# Patient Record
Sex: Female | Born: 1945 | Race: White | Hispanic: No | Marital: Married | Smoking: Never smoker
Health system: Southern US, Community
[De-identification: ages and names within clinical notes are randomized; demographics above are authoritative.]

## PROBLEM LIST (undated history)

## (undated) DIAGNOSIS — T7840XA Allergy, unspecified, initial encounter: Secondary | ICD-10-CM

## (undated) DIAGNOSIS — Z9889 Other specified postprocedural states: Secondary | ICD-10-CM

## (undated) DIAGNOSIS — C801 Malignant (primary) neoplasm, unspecified: Secondary | ICD-10-CM

## (undated) DIAGNOSIS — M719 Bursopathy, unspecified: Secondary | ICD-10-CM

## (undated) DIAGNOSIS — M1990 Unspecified osteoarthritis, unspecified site: Secondary | ICD-10-CM

## (undated) DIAGNOSIS — E785 Hyperlipidemia, unspecified: Secondary | ICD-10-CM

## (undated) DIAGNOSIS — I890 Lymphedema, not elsewhere classified: Secondary | ICD-10-CM

## (undated) DIAGNOSIS — R112 Nausea with vomiting, unspecified: Secondary | ICD-10-CM

## (undated) DIAGNOSIS — C4490 Unspecified malignant neoplasm of skin, unspecified: Secondary | ICD-10-CM

## (undated) DIAGNOSIS — R42 Dizziness and giddiness: Secondary | ICD-10-CM

## (undated) HISTORY — DX: Dizziness and giddiness: R42

## (undated) HISTORY — DX: Unspecified malignant neoplasm of skin, unspecified: C44.90

## (undated) HISTORY — PX: WISDOM TOOTH EXTRACTION: SHX21

## (undated) HISTORY — DX: Lymphedema, not elsewhere classified: I89.0

## (undated) HISTORY — PX: KNEE ARTHROSCOPY: SUR90

## (undated) HISTORY — DX: Allergy, unspecified, initial encounter: T78.40XA

## (undated) HISTORY — DX: Hyperlipidemia, unspecified: E78.5

## (undated) HISTORY — PX: CATARACT EXTRACTION: SUR2

## (undated) HISTORY — DX: Bursopathy, unspecified: M71.9

## (undated) HISTORY — PX: THYROID SURGERY: SHX805

---

## 1984-07-15 DIAGNOSIS — C801 Malignant (primary) neoplasm, unspecified: Secondary | ICD-10-CM

## 1984-07-15 HISTORY — DX: Malignant (primary) neoplasm, unspecified: C80.1

## 1984-07-15 HISTORY — PX: BREAST SURGERY: SHX581

## 1984-07-15 HISTORY — PX: BREAST RECONSTRUCTION: SHX9

## 1988-07-15 HISTORY — PX: ABDOMINAL HYSTERECTOMY: SHX81

## 1998-03-23 ENCOUNTER — Ambulatory Visit (HOSPITAL_BASED_OUTPATIENT_CLINIC_OR_DEPARTMENT_OTHER): Admission: RE | Admit: 1998-03-23 | Discharge: 1998-03-23 | Payer: Self-pay | Admitting: Podiatry

## 1998-07-15 DIAGNOSIS — I890 Lymphedema, not elsewhere classified: Secondary | ICD-10-CM

## 1998-07-15 HISTORY — DX: Lymphedema, not elsewhere classified: I89.0

## 2000-08-01 ENCOUNTER — Ambulatory Visit (HOSPITAL_COMMUNITY): Admission: RE | Admit: 2000-08-01 | Discharge: 2000-08-01 | Payer: Self-pay | Admitting: Gastroenterology

## 2000-12-16 ENCOUNTER — Other Ambulatory Visit: Admission: RE | Admit: 2000-12-16 | Discharge: 2000-12-16 | Payer: Self-pay | Admitting: Obstetrics and Gynecology

## 2001-05-25 ENCOUNTER — Ambulatory Visit (HOSPITAL_COMMUNITY): Admission: RE | Admit: 2001-05-25 | Discharge: 2001-05-25 | Payer: Self-pay | Admitting: Internal Medicine

## 2001-05-25 ENCOUNTER — Encounter: Payer: Self-pay | Admitting: Internal Medicine

## 2003-10-26 ENCOUNTER — Encounter: Admission: RE | Admit: 2003-10-26 | Discharge: 2004-01-24 | Payer: Self-pay | Admitting: *Deleted

## 2004-05-07 ENCOUNTER — Encounter: Payer: Self-pay | Admitting: Family Medicine

## 2004-07-15 HISTORY — PX: ANKLE FRACTURE SURGERY: SHX122

## 2004-11-09 ENCOUNTER — Encounter: Payer: Self-pay | Admitting: Internal Medicine

## 2004-11-09 ENCOUNTER — Ambulatory Visit: Payer: Self-pay | Admitting: Family Medicine

## 2005-02-27 ENCOUNTER — Ambulatory Visit: Payer: Self-pay | Admitting: Internal Medicine

## 2005-06-28 ENCOUNTER — Ambulatory Visit (HOSPITAL_COMMUNITY): Admission: RE | Admit: 2005-06-28 | Discharge: 2005-06-29 | Payer: Self-pay | Admitting: Orthopedic Surgery

## 2006-04-14 HISTORY — PX: COLONOSCOPY: SHX174

## 2006-11-10 ENCOUNTER — Ambulatory Visit: Payer: Self-pay | Admitting: Internal Medicine

## 2006-11-10 LAB — CONVERTED CEMR LAB
ALT: 21 units/L (ref 0–40)
Alkaline Phosphatase: 67 units/L (ref 39–117)
BUN: 15 mg/dL (ref 6–23)
Basophils Relative: 0.9 % (ref 0.0–1.0)
CO2: 29 meq/L (ref 19–32)
Calcium: 9 mg/dL (ref 8.4–10.5)
Cholesterol: 212 mg/dL (ref 0–200)
Creatinine, Ser: 0.9 mg/dL (ref 0.4–1.2)
GFR calc Af Amer: 81 mL/min
HDL: 52.1 mg/dL (ref >39.0)
Monocytes Relative: 9.6 % (ref 3.0–11.0)
Neutro Abs: 3.5 10*3/uL (ref 1.4–7.7)
Platelets: 336 10*3/uL (ref 150–400)
RBC: 4.79 M/uL (ref 3.87–5.11)
RDW: 12.4 % (ref 11.5–14.6)
T3, Free: 2.8 pg/mL (ref 2.3–4.2)
Total CHOL/HDL Ratio: 4.1
Total Protein: 7.4 g/dL (ref 6.0–8.3)
VLDL: 19 mg/dL (ref 0–40)

## 2006-11-18 ENCOUNTER — Encounter: Admission: RE | Admit: 2006-11-18 | Discharge: 2006-11-18 | Payer: Self-pay | Admitting: Internal Medicine

## 2006-11-18 ENCOUNTER — Encounter: Payer: Self-pay | Admitting: Internal Medicine

## 2006-11-20 ENCOUNTER — Encounter: Payer: Self-pay | Admitting: Internal Medicine

## 2006-12-02 ENCOUNTER — Encounter: Admission: RE | Admit: 2006-12-02 | Discharge: 2006-12-02 | Payer: Self-pay | Admitting: Internal Medicine

## 2006-12-02 ENCOUNTER — Encounter (INDEPENDENT_AMBULATORY_CARE_PROVIDER_SITE_OTHER): Payer: Self-pay | Admitting: Interventional Radiology

## 2006-12-02 ENCOUNTER — Other Ambulatory Visit: Admission: RE | Admit: 2006-12-02 | Discharge: 2006-12-02 | Payer: Self-pay | Admitting: Interventional Radiology

## 2006-12-11 ENCOUNTER — Encounter: Admission: RE | Admit: 2006-12-11 | Discharge: 2006-12-11 | Payer: Self-pay | Admitting: Internal Medicine

## 2006-12-17 ENCOUNTER — Encounter: Payer: Self-pay | Admitting: Internal Medicine

## 2007-03-20 ENCOUNTER — Encounter: Payer: Self-pay | Admitting: Internal Medicine

## 2007-12-02 ENCOUNTER — Ambulatory Visit: Payer: Self-pay | Admitting: Internal Medicine

## 2007-12-02 DIAGNOSIS — Z85828 Personal history of other malignant neoplasm of skin: Secondary | ICD-10-CM

## 2007-12-02 DIAGNOSIS — Z853 Personal history of malignant neoplasm of breast: Secondary | ICD-10-CM

## 2007-12-02 DIAGNOSIS — E785 Hyperlipidemia, unspecified: Secondary | ICD-10-CM | POA: Insufficient documentation

## 2007-12-02 DIAGNOSIS — E559 Vitamin D deficiency, unspecified: Secondary | ICD-10-CM | POA: Insufficient documentation

## 2007-12-02 DIAGNOSIS — J309 Allergic rhinitis, unspecified: Secondary | ICD-10-CM

## 2007-12-02 LAB — CONVERTED CEMR LAB
Albumin: 4 g/dL (ref 3.5–5.2)
BUN: 14 mg/dL (ref 6–23)
Basophils Relative: 1 % (ref 0.0–1.0)
Calcium: 9.2 mg/dL (ref 8.4–10.5)
Cholesterol, target level: 200 mg/dL
Creatinine, Ser: 0.9 mg/dL (ref 0.4–1.2)
Eosinophils Absolute: 0.2 10*3/uL (ref 0.0–0.7)
Eosinophils Relative: 2.6 % (ref 0.0–5.0)
Free T4: 0.8 ng/dL (ref 0.6–1.6)
GFR calc Af Amer: 81 mL/min
GFR calc non Af Amer: 67 mL/min
HCT: 42.7 % (ref 36.0–46.0)
Hemoglobin: 14.6 g/dL (ref 12.0–15.0)
Hgb A1c MFr Bld: 5.8 % (ref 4.6–6.0)
LDL Goal: 130 mg/dL
MCV: 90.1 fL (ref 78.0–100.0)
Monocytes Absolute: 0.5 10*3/uL (ref 0.1–1.0)
Neutro Abs: 3.9 10*3/uL (ref 1.4–7.7)
Platelets: 376 10*3/uL (ref 150–400)
RBC: 4.74 M/uL (ref 3.87–5.11)
TSH: 2.21 microintl units/mL (ref 0.35–5.50)
Total Protein: 7.3 g/dL (ref 6.0–8.3)
Triglycerides: 151 mg/dL — ABNORMAL HIGH (ref 0–149)
WBC: 6.3 10*3/uL (ref 4.5–10.5)

## 2007-12-03 ENCOUNTER — Encounter: Payer: Self-pay | Admitting: Internal Medicine

## 2007-12-04 ENCOUNTER — Encounter (INDEPENDENT_AMBULATORY_CARE_PROVIDER_SITE_OTHER): Payer: Self-pay | Admitting: *Deleted

## 2007-12-24 ENCOUNTER — Encounter: Admission: RE | Admit: 2007-12-24 | Discharge: 2007-12-24 | Payer: Self-pay | Admitting: Internal Medicine

## 2007-12-25 ENCOUNTER — Encounter (INDEPENDENT_AMBULATORY_CARE_PROVIDER_SITE_OTHER): Payer: Self-pay | Admitting: *Deleted

## 2008-03-03 ENCOUNTER — Ambulatory Visit: Payer: Self-pay | Admitting: Internal Medicine

## 2008-03-03 DIAGNOSIS — E042 Nontoxic multinodular goiter: Secondary | ICD-10-CM | POA: Insufficient documentation

## 2008-03-07 ENCOUNTER — Encounter (INDEPENDENT_AMBULATORY_CARE_PROVIDER_SITE_OTHER): Payer: Self-pay | Admitting: *Deleted

## 2008-03-07 LAB — CONVERTED CEMR LAB
ALT: 32 units/L (ref 0–35)
Albumin: 3.9 g/dL (ref 3.5–5.2)
HDL: 50.4 mg/dL (ref >39.0)
Total Bilirubin: 0.7 mg/dL (ref 0.3–1.2)
Total CHOL/HDL Ratio: 3.4
Triglycerides: 105 mg/dL (ref 0–149)
VLDL: 21 mg/dL (ref 0–40)
Vit D, 1,25-Dihydroxy: 49 (ref 30–89)

## 2008-03-11 ENCOUNTER — Telehealth (INDEPENDENT_AMBULATORY_CARE_PROVIDER_SITE_OTHER): Payer: Self-pay | Admitting: *Deleted

## 2009-03-13 ENCOUNTER — Encounter: Payer: Self-pay | Admitting: Internal Medicine

## 2009-04-06 ENCOUNTER — Ambulatory Visit: Payer: Self-pay | Admitting: Internal Medicine

## 2009-04-06 DIAGNOSIS — R7303 Prediabetes: Secondary | ICD-10-CM | POA: Insufficient documentation

## 2009-04-06 LAB — CONVERTED CEMR LAB
Glucose, Urine, Semiquant: NEGATIVE
Nitrite: NEGATIVE
Specific Gravity, Urine: <1.005
pH: 7

## 2009-04-07 ENCOUNTER — Encounter: Payer: Self-pay | Admitting: Internal Medicine

## 2009-04-09 LAB — CONVERTED CEMR LAB
ALT: 21 units/L (ref 0–35)
AST: 22 units/L (ref 0–37)
BUN: 15 mg/dL (ref 6–23)
Bilirubin, Direct: 0 mg/dL (ref 0.0–0.3)
Cholesterol: 203 mg/dL — ABNORMAL HIGH (ref 0–200)
Creatinine, Ser: 0.9 mg/dL (ref 0.4–1.2)
Eosinophils Relative: 2.9 % (ref 0.0–5.0)
GFR calc non Af Amer: 66.68 mL/min (ref >60)
HDL: 50.2 mg/dL (ref >39.00)
Hgb A1c MFr Bld: 5.8 % (ref 4.6–6.5)
Lymphocytes Relative: 24.4 % (ref 12.0–46.0)
Monocytes Relative: 9.6 % (ref 3.0–12.0)
Neutrophils Relative %: 62.4 % (ref 43.0–77.0)
Platelets: 310 10*3/uL (ref 150.0–400.0)
Total Bilirubin: 0.8 mg/dL (ref 0.3–1.2)
VLDL: 25.2 mg/dL (ref 0.0–40.0)
WBC: 7.2 10*3/uL (ref 4.5–10.5)

## 2009-04-10 ENCOUNTER — Telehealth (INDEPENDENT_AMBULATORY_CARE_PROVIDER_SITE_OTHER): Payer: Self-pay | Admitting: *Deleted

## 2009-04-11 ENCOUNTER — Telehealth (INDEPENDENT_AMBULATORY_CARE_PROVIDER_SITE_OTHER): Payer: Self-pay | Admitting: *Deleted

## 2009-04-11 LAB — CONVERTED CEMR LAB: Vit D, 25-Hydroxy: 21 ng/mL — ABNORMAL LOW (ref 30–89)

## 2009-04-12 ENCOUNTER — Encounter: Admission: RE | Admit: 2009-04-12 | Discharge: 2009-04-12 | Payer: Self-pay | Admitting: Internal Medicine

## 2009-04-14 ENCOUNTER — Encounter (INDEPENDENT_AMBULATORY_CARE_PROVIDER_SITE_OTHER): Payer: Self-pay | Admitting: *Deleted

## 2009-07-03 ENCOUNTER — Ambulatory Visit: Payer: Self-pay | Admitting: Internal Medicine

## 2009-07-10 ENCOUNTER — Ambulatory Visit: Payer: Self-pay | Admitting: Internal Medicine

## 2009-12-06 ENCOUNTER — Ambulatory Visit: Payer: Self-pay | Admitting: Family Medicine

## 2009-12-22 ENCOUNTER — Ambulatory Visit: Payer: Self-pay | Admitting: Internal Medicine

## 2009-12-22 ENCOUNTER — Telehealth (INDEPENDENT_AMBULATORY_CARE_PROVIDER_SITE_OTHER): Payer: Self-pay | Admitting: *Deleted

## 2009-12-22 LAB — CONVERTED CEMR LAB
Bilirubin Urine: NEGATIVE
Glucose, Urine, Semiquant: NEGATIVE
Protein, U semiquant: NEGATIVE
Specific Gravity, Urine: <1.005

## 2009-12-23 ENCOUNTER — Encounter: Payer: Self-pay | Admitting: Internal Medicine

## 2009-12-23 LAB — CONVERTED CEMR LAB
Casts: NONE SEEN /lpf
Crystals: NONE SEEN
RBC / HPF: NONE SEEN (ref <3)

## 2010-03-27 ENCOUNTER — Ambulatory Visit: Payer: Self-pay | Admitting: Family Medicine

## 2010-05-17 ENCOUNTER — Telehealth (INDEPENDENT_AMBULATORY_CARE_PROVIDER_SITE_OTHER): Payer: Self-pay | Admitting: *Deleted

## 2010-05-30 ENCOUNTER — Encounter: Payer: Self-pay | Admitting: Internal Medicine

## 2010-07-06 ENCOUNTER — Ambulatory Visit: Payer: Self-pay | Admitting: Internal Medicine

## 2010-07-06 ENCOUNTER — Encounter: Payer: Self-pay | Admitting: Internal Medicine

## 2010-07-06 DIAGNOSIS — M1990 Unspecified osteoarthritis, unspecified site: Secondary | ICD-10-CM | POA: Insufficient documentation

## 2010-07-10 ENCOUNTER — Encounter: Payer: Self-pay | Admitting: Internal Medicine

## 2010-07-10 ENCOUNTER — Telehealth: Payer: Self-pay | Admitting: Internal Medicine

## 2010-07-10 ENCOUNTER — Ambulatory Visit: Payer: Self-pay | Admitting: Internal Medicine

## 2010-07-10 LAB — CONVERTED CEMR LAB
Blood in Urine, dipstick: NEGATIVE
Nitrite: NEGATIVE
Urobilinogen, UA: 0.2
WBC Urine, dipstick: NEGATIVE

## 2010-07-11 ENCOUNTER — Ambulatory Visit: Admit: 2010-07-11 | Payer: Self-pay | Admitting: Internal Medicine

## 2010-07-11 LAB — CONVERTED CEMR LAB
ALT: 22 units/L (ref 0–35)
Albumin: 4.1 g/dL (ref 3.5–5.2)
BUN: 17 mg/dL (ref 6–23)
Basophils Relative: 0.7 % (ref 0.0–3.0)
CO2: 29 meq/L (ref 19–32)
Chloride: 103 meq/L (ref 96–112)
Cholesterol: 224 mg/dL — ABNORMAL HIGH (ref 0–200)
Creatinine, Ser: 0.9 mg/dL (ref 0.4–1.2)
Eosinophils Absolute: 0.2 10*3/uL (ref 0.0–0.7)
Eosinophils Relative: 2.7 % (ref 0.0–5.0)
Glucose, Bld: 89 mg/dL (ref 70–99)
Hemoglobin: 15.3 g/dL — ABNORMAL HIGH (ref 12.0–15.0)
MCHC: 33.6 g/dL (ref 30.0–36.0)
MCV: 92.4 fL (ref 78.0–100.0)
Monocytes Absolute: 0.6 10*3/uL (ref 0.1–1.0)
Neutro Abs: 4.8 10*3/uL (ref 1.4–7.7)
RBC: 4.94 M/uL (ref 3.87–5.11)
Total Protein: 7.3 g/dL (ref 6.0–8.3)
Triglycerides: 193 mg/dL — ABNORMAL HIGH (ref 0.0–149.0)
WBC: 7.9 10*3/uL (ref 4.5–10.5)

## 2010-07-12 ENCOUNTER — Ambulatory Visit
Admission: RE | Admit: 2010-07-12 | Discharge: 2010-07-12 | Payer: Self-pay | Source: Home / Self Care | Attending: Internal Medicine | Admitting: Internal Medicine

## 2010-07-12 LAB — CONVERTED CEMR LAB: Potassium: 4.5 meq/L (ref 3.5–5.1)

## 2010-07-17 ENCOUNTER — Telehealth: Payer: Self-pay | Admitting: Internal Medicine

## 2010-08-05 ENCOUNTER — Encounter: Payer: Self-pay | Admitting: Family Medicine

## 2010-08-14 NOTE — Progress Notes (Signed)
Summary: uti???  Phone Note Call from Patient Call back at Work Phone [phone]   Caller: Patient Summary of Call: patient has burning & frequency to urinate -wanted appt today Initial call taken by: Okey Regal Spring,  December 22, 2009 12:19 PM  Follow-up for Phone Call        OV SCHEDULED .Kandice Hams  December 22, 2009 1:22 PM

## 2010-08-14 NOTE — Assessment & Plan Note (Signed)
Summary: UTI/ALR   Vital Signs:  Patient profile:   65 year old female Weight:      244 pounds Temp:     98.3 degrees F oral BP sitting:   112 / 78  (left arm)  Vitals Entered By: Doristine Devoid (December 22, 2009 3:31 PM) CC: UTI sx started last night some frequency and dysuria    History of Present Illness: urinary frequency, started last night. This morning she did see some blood in her urine She just finished a 10 day course of Augmentin for a upper respiratory tract infection  Allergies: No Known Drug Allergies  Past History:  Past Medical History: Reviewed history from 04/06/2009 and no changes required. Thyroid nodule 1988(multinodular goiter ) Allergic rhinitis Hyperlipidemia : LDL goal + < 130 based on NMR Lipoprofile; Basal cell & squamous cell, Dr Nicholas Lose Vitamin D deficiency, Estill Bakes NP Breast cancer, hx of, bilateral  Past Surgical History: Reviewed history from 04/06/2009 and no changes required. Arthroscopy L knee 2003 ; ankle surgery post fracture 2006;G1 P 1 Colonoscopy 2007  neg , Dr Kinnie Scales Hysterectomy & bilat oophorectomy for fibroids Mastectomy bilat  with reconstruction 1985 & 1986 Mohs surgery facial CA X3  Social History: Reviewed history from 04/06/2009 and no changes required. No diet Occupation: Print production planner Married Never Smoked Alcohol use-yes: occasinally Regular exercise-yes: walking 1/4 mpd 3X/week  Review of Systems General:  Denies fever. GI:  Denies nausea and vomiting. GU:  no back or flank pain no vad d/c , itching, rash .  Physical Exam  General:  alert, well-developed, and overweight-appearing.   Lungs:  normal respiratory effort, no intercostal retractions, and no accessory muscle use.   Abdomen:  soft, non-tender, no distention, no masses, no guarding, and no rigidity.  no CVA tenderness Extremities:  no lower extremity edema   Impression & Recommendations:  Problem # 1:  UTI (ICD-599.0) symptoms and urine  consistent with a UTI Recommend to increase fluid intake, some antibiotics, call if not better in a few days Her updated medication list for this problem includes:    Ciprofloxacin Hcl 500 Mg Tabs (Ciprofloxacin hcl) .Marland Kitchen... 1 by mouth two times a day  Orders: UA Dipstick w/o Micro (manual) (56213) Specimen Handling (99000) T-Culture, Urine (08657-84696) T-Urine Microscopic (29528-41324)  Complete Medication List: 1)  Kls Aller-tec 10 Mg Tabs (Cetirizine hcl) .... Daily 2)  Oscal 500/200 D-3 500-200 Mg-unit Tabs (Calcium-vitamin d) .Marland Kitchen.. 1200 mg's daily 3)  Vitamin D 40102 Unit Caps (Ergocalciferol) .... Take one tablet weekly 4)  Pravachol 40 Mg Tabs (Pravastatin sodium) .Marland Kitchen.. 1 qhs 5)  Aspir-low 81 Mg Tbec (Aspirin) .... Daily 6)  Nasonex 50 Mcg/act Susp (Mometasone furoate) .... 2 sprays each nostril once daily 7)  Ciprofloxacin Hcl 500 Mg Tabs (Ciprofloxacin hcl) .Marland Kitchen.. 1 by mouth two times a day Prescriptions: CIPROFLOXACIN HCL 500 MG TABS (CIPROFLOXACIN HCL) 1 by mouth two times a day  #6 x 0   Entered and Authorized by:   Nolon Rod. Katina Remick MD   Signed by:   Nolon Rod. Raekwan Spelman MD on 12/22/2009   Method used:   Print then Give to Patient   RxID:   [id number]   Laboratory Results   Urine Tests    Routine Urinalysis   Glucose: negative   (Normal Range: Negative) Bilirubin: negative   (Normal Range: Negative) Ketone: negative   (Normal Range: Negative) Spec. Gravity: <1.005   (Normal Range: 1.003-1.035) Blood: large   (Normal Range: Negative) pH: 6.5   (  Normal Range: 5.0-8.0) Protein: negative   (Normal Range: Negative) Urobilinogen: 0.2   (Normal Range: 0-1) Nitrite: negative   (Normal Range: Negative) Leukocyte Esterace: large   (Normal Range: Negative)

## 2010-08-14 NOTE — Progress Notes (Signed)
Summary: lab order-pt will wait until after cpx  Phone Note Call from Patient   Caller: Patient Summary of Call: patient is scheduled for cpx [id number] - lab [phone] --- need lab order Initial call taken by: Okey Regal Spring,  May 17, 2010 3:57 PM  Follow-up for Phone Call        Lipid/Hep/CBCD/BMP/TSH/Vit D/Stool Cards  V70.0/286.9/272.4/995.20 Follow-up by: Shonna Chock CMA,  May 17, 2010 4:06 PM  Additional Follow-up for Phone Call Additional follow up Details #1::        patient has medicare -schedule lab appt [phone] after cpx appt .Marland KitchenOkey Regal Spring  May 17, 2010 4:30 PM

## 2010-08-14 NOTE — Assessment & Plan Note (Signed)
Summary: SORE THROAT/RH.....   Vital Signs:  Patient profile:   65 year old female Height:      64.5 inches Weight:      239 pounds BMI:     40.54 Pulse rate:   82 / minute Pulse rhythm:   regular BP sitting:   122 / 82  (left arm) Cuff size:   large  Vitals Entered By: Army Fossa CMA (Dec 06, 2009 11:10 AM) CC: Pt here for HA, Chest Congestion, drainage in her throat x 1  week. Has tried delysm., URI symptoms   History of Present Illness:       This is a 65 year old woman who presents with URI symptoms.  The symptoms began 1 week ago.  The patient complains of nasal congestion, purulent nasal discharge, sore throat, productive cough, earache, and sick contacts.  Associated symptoms include fever.  The patient also reports headache.  The patient denies itchy watery eyes, itchy throat, sneezing, seasonal symptoms, response to antihistamine, muscle aches, and severe fatigue.  The patient denies the following risk factors for Strep sinusitis: unilateral facial pain, unilateral nasal discharge, poor response to decongestant, double sickening, tooth pain, Strep exposure, tender adenopathy, and absence of cough.  + b/l sinus pressure / headache.  Pt tried delsym, tylenol with no relief.     Current Medications (verified): 1)  Kls Aller-Tec 10 Mg Tabs (Cetirizine Hcl) .... Daily 2)  Oscal 500/200 D-3 500-200 Mg-Unit  Tabs (Calcium-Vitamin D) .Marland Kitchen.. 1200 Mg's Daily 3)  Rhinocort Aqua 32 Mcg/act  Susp (Budesonide) .... 2 Sprays in Each Nostril Daily 4)  Vitamin D 45409 Unit  Caps (Ergocalciferol) .... Take One Tablet Weekly 5)  Pravachol 40 Mg  Tabs (Pravastatin Sodium) .Marland Kitchen.. 1 Qhs 6)  Nasacort Aq 55 Mcg/act Aers (Triamcinolone Acetonide(Nasal)) .... As Directed 7)  Aspir-Low 81 Mg Tbec (Aspirin) .... Daily 8)  Augmentin 875-125 Mg Tabs (Amoxicillin-Pot Clavulanate) .Marland Kitchen.. 1 By Mouth Two Times A Day 9)  Cheratussin Ac 100-10 Mg/89ml Syrp (Guaifenesin-Codeine) .Marland Kitchen.. 1-2 Tsp By Mouth At Bedtime As  Needed Cough  Allergies (verified): No Known Drug Allergies  Past History:  Past medical, surgical, family and social histories (including risk factors) reviewed for relevance to current acute and chronic problems.  Past Medical History: Reviewed history from 04/06/2009 and no changes required. Thyroid nodule 1988(multinodular goiter ) Allergic rhinitis Hyperlipidemia : LDL goal + < 130 based on NMR Lipoprofile; Basal cell & squamous cell, Dr Nicholas Lose Vitamin D deficiency, Estill Bakes NP Breast cancer, hx of, bilateral  Past Surgical History: Reviewed history from 04/06/2009 and no changes required. Arthroscopy L knee 2003 ; ankle surgery post fracture 2006;G1 P 1 Colonoscopy 2007  neg , Dr Kinnie Scales Hysterectomy & bilat oophorectomy for fibroids Mastectomy bilat  with reconstruction 1985 & 1986 Mohs surgery facial CA X3  Family History: Reviewed history from 04/06/2009 and no changes required. Father: neg (limited knowledge of health) Mother: HTN Siblings: neg MGF CVA  Social History: Reviewed history from 04/06/2009 and no changes required. No diet Occupation: Print production planner Married Never Smoked Alcohol use-yes: occasinally Regular exercise-yes: walking 1/4 mpd 3X/week  Review of Systems      See HPI  Physical Exam  General:  Well-developed,well-nourished,in no acute distress; alert,appropriate and cooperative throughout examination Ears:  External ear exam shows no significant lesions or deformities.  Otoscopic examination reveals clear canals, tympanic membranes are intact bilaterally without bulging, retraction, inflammation or discharge. Hearing is grossly normal bilaterally. Nose:  L frontal sinus tenderness,  L maxillary sinus tenderness, R frontal sinus tenderness, and R maxillary sinus tenderness.   Mouth:  Oral mucosa and oropharynx without lesions or exudates.  Teeth in good repair. Neck:  No deformities, masses, or tenderness noted. Lungs:  Normal respiratory  effort, chest expands symmetrically. Lungs are clear to auscultation, no crackles or wheezes. Heart:  normal rate and no murmur.   Psych:  Oriented X3 and normally interactive.     Impression & Recommendations:  Problem # 1:  SINUSITIS - ACUTE-NOS (ICD-461.9)  The following medications were removed from the medication list:    Rhinocort Aqua 32 Mcg/act Susp (Budesonide) .Marland Kitchen... 2 sprays in each nostril daily    Nasacort Aq 55 Mcg/act Aers (Triamcinolone acetonide(nasal)) .Marland Kitchen... As directed Her updated medication list for this problem includes:    Augmentin 875-125 Mg Tabs (Amoxicillin-pot clavulanate) .Marland Kitchen... 1 by mouth two times a day    Cheratussin Ac 100-10 Mg/24ml Syrp (Guaifenesin-codeine) .Marland Kitchen... 1-2 tsp by mouth at bedtime as needed cough    Nasonex 50 Mcg/act Susp (Mometasone furoate) .Marland Kitchen... 2 sprays each nostril once daily   Mucinex for daytime cough Instructed on treatment. Call if symptoms persist or worsen.   Orders: Prescription Created Electronically [phone])  Complete Medication List: 1)  Kls Aller-tec 10 Mg Tabs (Cetirizine hcl) .... Daily 2)  Oscal 500/200 D-3 500-200 Mg-unit Tabs (Calcium-vitamin d) .Marland Kitchen.. 1200 mg's daily 3)  Vitamin D 09811 Unit Caps (Ergocalciferol) .... Take one tablet weekly 4)  Pravachol 40 Mg Tabs (Pravastatin sodium) .Marland Kitchen.. 1 qhs 5)  Aspir-low 81 Mg Tbec (Aspirin) .... Daily 6)  Augmentin 875-125 Mg Tabs (Amoxicillin-pot clavulanate) .Marland Kitchen.. 1 by mouth two times a day 7)  Cheratussin Ac 100-10 Mg/64ml Syrp (Guaifenesin-codeine) .Marland Kitchen.. 1-2 tsp by mouth at bedtime as needed cough 8)  Nasonex 50 Mcg/act Susp (Mometasone furoate) .... 2 sprays each nostril once daily Prescriptions: CHERATUSSIN AC 100-10 MG/5ML SYRP (GUAIFENESIN-CODEINE) 1-2 tsp by mouth at bedtime as needed cough  #6 oz x 0   Entered and Authorized by:   Loreen Freud DO   Signed by:   Loreen Freud DO on 12/06/2009   Method used:   Print then Give to Patient   RxID:   [id number] AUGMENTIN  875-125 MG TABS (AMOXICILLIN-POT CLAVULANATE) 1 by mouth two times a day  #20 x 0   Entered and Authorized by:   Loreen Freud DO   Signed by:   Loreen Freud DO on 12/06/2009   Method used:   Electronically to        [facility] [phone]* (retail)       [address]       [place], [place]  [address]       Ph: [phone] or [phone]       Fax: [phone]   RxID:   [id number]

## 2010-08-16 NOTE — Assessment & Plan Note (Signed)
Summary: CPX/CBS   Vital Signs:  Patient profile:   65 year old female Height:      64.25 inches Weight:      247 pounds BMI:     42.22 Temp:     97.6 degrees F oral Pulse rate:   76 / minute Resp:     16 per minute BP sitting:   128 / 80  (left arm) Cuff size:   large  Vitals Entered By: Shonna Chock CMA (July 06, 2010 1:15 PM) CC: CPX , Lipid Management   Primary Care Provider:  Marga Melnick MD  CC:  CPX  and Lipid Management.  History of Present Illness: Here for Medicare AWV: 1.Risk factors based on Past M, S, F history:see Diagnoses ( chart updated) 2.Physical Activities: stationary bike 15 min occasionally; walking 1 mpd every other day  3.Depression/mood: no issues 4.Hearing: whisper heard @ 6 ft 5.ADL's: no limitations 6.Fall Risk: none  7.Home Safety: no risk 8.Height, weight, &visual acuity:wall chart read @ 6 ft with lenses 9.Counseling: POA & Living Will discussed. Caffeine consumption (4 cups / day ) discussed 10.Labs ordered based on risk factors: see Orders 11. Referral Coordination: none requested;  preventive services up to date Shirlyn Goltz , NP seen 04/2010 12. Care Plan: see Instructions( see # 9 above) 13. Cognitive Assessment: Oriented X 3;   memory & recall   ;"WORLD" spelled backwards ; mood & affect normal. Hyperlipidemia Follow-Up      This is a 65 year old woman who also  presents for Hyperlipidemia follow-up.  The patient denies muscle aches, GI upset, abdominal pain, flushing, itching, constipation, diarrhea, and fatigue.  The patient denies the following symptoms: chest pain/pressure, exercise intolerance , dypsnea, palpitations, syncope, and pedal edema.  Compliance with medications (by patient report) has been near 100%.  Dietary compliance has been fair.  The patient reports exercising 3-4X per week.  Adjunctive measures currently used by the patient include fiber, ASA, and fish oil supplements.    Lipid Management History:  Positive NCEP/ATP III risk factors include female age 67 years old or older and early menopause without estrogen hormone replacement.  Negative NCEP/ATP III risk factors include non-diabetic, no family history for ischemic heart disease, non-tobacco-user status, non-hypertensive, no ASHD (atherosclerotic heart disease), no prior stroke/TIA, no peripheral vascular disease, and no history of aortic aneurysm.     Preventive Screening-Counseling & Management  Alcohol-Tobacco     Alcohol drinks/day: 0     Smoking Status: never  Caffeine-Diet-Exercise     Caffeine use/day: 4 cups  Hep-HIV-STD-Contraception     Dental Visit-last 6 months yes     Sun Exposure-Excessive: no  Safety-Violence-Falls     Seat Belt Use: yes     Smoke Detectors: yes      Blood Transfusions:  no.        Travel History:  [place]  1991.    Current Medications (verified): 1)  Kls Aller-Tec 10 Mg Tabs (Cetirizine Hcl) .... Daily 2)  Oscal 500/200 D-3 500-200 Mg-Unit  Tabs (Calcium-Vitamin D) .Marland Kitchen.. 1200 Mg's Daily 3)  Vitamin D 04540 Unit  Caps (Ergocalciferol) .Marland Kitchen.. 1 By Mouth Every Other Week 4)  Pravachol 40 Mg  Tabs (Pravastatin Sodium) .Marland Kitchen.. 1 By Mouth At Bedtime **appointment Due** 5)  Aspir-Low 81 Mg Tbec (Aspirin) .... Daily 6)  Nasonex 50 Mcg/act Susp (Mometasone Furoate) .... 2 Sprays Each Nostril Once Daily  Allergies (verified): No Known Drug Allergies  Past History:  Past Medical History:  Thyroid nodule , PMH of 1988 (multinodular goiter ) Allergic rhinitis Hyperlipidemia : 2005 NMR Lipoprofile : LDL 147( 1967/ 1523), HDL 60, TG 76. LDL goal = < 100. [place] Study LDL goal = < 130 ; Basal cell & squamous cell, Dr Nicholas Lose Vitamin D deficiency,Patty Berneice Gandy NP Breast cancer, PMH  of, bilateral 1988  Past Surgical History: Arthroscopy L knee 2003 , R knee 2009; ankle surgery post fracture 2006;G1 P 1 Colonoscopy 2007  negative  , Dr Kinnie Scales  Mastectomy bilat  with reconstruction 1988   Mohs surgery  facial  cancer  X3, Dr Nicholas Lose Hysterectomy & bilateral Oophorectomy for fibroids 1990   Family History: Father: negative  (limited knowledge of  his health history ) Mother: HTN Siblings: negative MGF: CVA  Social History: No diet Occupation: Print production planner Married Never Smoked Caffeine use/day:  4 cups Dental Care w/in 6 mos.:  yes Sun Exposure-Excessive:  no Risk analyst Use:  yes Blood Transfusions:  no  Review of Systems  The patient denies anorexia, fever, weight loss, weight gain, vision loss, decreased hearing, hoarseness, prolonged cough, hemoptysis, melena, hematochezia, severe indigestion/heartburn, hematuria, unusual weight change, abnormal bleeding, enlarged lymph nodes, and angioedema.    Physical Exam  General:  well-nourished; alert,appropriate and cooperative throughout examination Head:  Normocephalic and atraumatic without obvious abnormalities. Eyes:  No corneal or conjunctival inflammation noted. Perrla. Funduscopic exam benign, without hemorrhages, exudates or papilledema. Ears:  External ear exam shows no significant lesions or deformities.  Otoscopic examination reveals clear canals, tympanic membranes are intact bilaterally without bulging, retraction, inflammation or discharge. Hearing is grossly normal bilaterally. Nose:  External nasal examination shows no deformity or inflammation. Nasal mucosa are pink and moist without lesions or exudates. Minimal dislocation Mouth:  Oral mucosa and oropharynx without lesions or exudates.  Teeth in good repair. Neck:  No deformities, masses, or tenderness noted.  Lungs:  Normal respiratory effort, chest expands symmetrically. Lungs are clear to auscultation, no crackles or wheezes. Heart:  Normal rate and regular rhythm. S1 and S2 normal without gallop, murmur, click, rub .S4 Abdomen:  Bowel sounds positive,abdomen soft and non-tender without masses, organomegaly or hernias noted. Genitalia:  Julieta Gutting, NP Msk:  No  deformity or scoliosis noted of thoracic or lumbar spine.   Pulses:  R and L carotid,radial,dorsalis pedis and posterior tibial pulses are full and equal bilaterally Extremities:  No clubbing, cyanosis, edema. Aarus changes  with marked crepitus , R >> L. Neurologic:  alert & oriented X3 and DTRs symmetrical and normal.   Skin:  Intact without suspicious lesions or rashes Cervical Nodes:  No lymphadenopathy noted Axillary Nodes:  No palpable lymphadenopathy Psych:  memory intact for recent and remote, normally interactive, and good eye contact.     Impression & Recommendations:  Problem # 1:  PREVENTIVE HEALTH CARE (ICD-V70.0)  Orders: Medicare -1st Annual Wellness Visit [phone])  Problem # 2:  HYPERLIPIDEMIA (ICD-272.4)  Her updated medication list for this problem includes:    Pravachol 40 Mg Tabs (Pravastatin sodium) .Marland Kitchen... 1 by mouth at bedtime  Orders: EKG w/ Interpretation (93000)  Problem # 3:  UNSPECIFIED VITAMIN D DEFICIENCY (ICD-268.9)  Problem # 4:  FASTING HYPERGLYCEMIA (ICD-790.29)  Problem # 5:  NONTOXIC MULTINODULAR GOITER (ICD-241.1)  Problem # 6:  ALLERGIC RHINITIS (ICD-477.9) controlled with OTC Zyrtec Her updated medication list for this problem includes:    Kls Aller-tec 10 Mg Tabs (Cetirizine hcl) .Marland Kitchen... Daily    Nasonex 50 Mcg/act Susp (Mometasone furoate) .Marland KitchenMarland KitchenMarland KitchenMarland Kitchen 2  sprays each nostril once daily  Complete Medication List: 1)  Kls Aller-tec 10 Mg Tabs (Cetirizine hcl) .... Daily 2)  Oscal 500/200 D-3 500-200 Mg-unit Tabs (Calcium-vitamin d) .Marland Kitchen.. 1200 mg's daily 3)  Vitamin D 16109 Unit Caps (Ergocalciferol) .Marland Kitchen.. 1 by mouth every other week 4)  Pravachol 40 Mg Tabs (Pravastatin sodium) .Marland Kitchen.. 1 by mouth at bedtime 5)  Aspir-low 81 Mg Tbec (Aspirin) .... Daily 6)  Nasonex 50 Mcg/act Susp (Mometasone furoate) .... 2 sprays each nostril once daily  Lipid Assessment/Plan:      Based on NCEP/ATP III, the patient's risk factor category is "0-1 risk factors".  The  patient's lipid goals are as follows: Total cholesterol goal is 200; LDL cholesterol goal is 130; HDL cholesterol goal is 50; Triglyceride goal is 150.  Her LDL cholesterol goal has not been met.  Secondary causes for hyperlipidemia have been ruled out.  She has been counseled on adjunctive measures for lowering her cholesterol and has been provided with dietary instructions.    Patient Instructions: 1)  Schedule fasting labs; see  Diagnoses for Codes:vitamin D level; 2)  BMP; 3)  Hepatic Panel ; 4)  Lipid Panel ; 5)  TSH ; 6)  HbgA1C .  7)  It is important that you exercise regularly at least 20 minutes 5 times a week. If you develop chest pain, have severe difficulty breathing, or feel very tired , stop exercising immediately and seek medical attention. Consider reducing caffeine intake. 8)  Take an  81 mg coated Aspirin every day. Prescriptions: PRAVACHOL 40 MG  TABS (PRAVASTATIN SODIUM) 1 by mouth at bedtime  #90 x 3   Entered and Authorized by:   Marga Melnick MD   Signed by:   Marga Melnick MD on 07/06/2010   Method used:   Print then Give to Patient   RxID:   [id number]    Orders Added: 1)  Medicare -1st Annual Wellness Visit [G0438] 2)  Est. Patient Level III [95621] 3)  EKG w/ Interpretation [93000]

## 2010-08-16 NOTE — Progress Notes (Signed)
Summary: RX clarification  Phone Note Call from Patient Call back at Work Phone [phone]   Summary of Call: Patient would like to know if she should continue the Pravachol 40mg  given to her or does she need a different dosage? She would also like clarification of vitamin D. She currently takes 50,000 units q 2 weeks, and would like to be sure that she is to take 1000 units per day in addition to the 50,000 she is already taking. Please advise. Initial call taken by: Lucious Groves CMA,  July 17, 2010 2:42 PM  Follow-up for Phone Call        add vitamin D3 1000 International Units once daily to present dose. Level was 34 ; goal = 40-60. Recheck in 4 months. Stay on Pravastatin but check [place] Panel also in 4 months after nutrition changes (see Appendum to labs) Follow-up by: Marga Melnick MD,  July 17, 2010 3:36 PM  Additional Follow-up for Phone Call Additional follow up Details #1::        Called patient to notify and she has already left for today. Lucious Groves CMA  July 17, 2010 4:15 PM  Patient spouse advised to have the patient call me. Lucious Groves CMA  July 17, 2010 4:37 PM     Additional Follow-up for Phone Call Additional follow up Details #2::    Patient notified. Lucious Groves CMA  July 18, 2010 8:32 AM

## 2010-08-16 NOTE — Progress Notes (Signed)
Summary: critical lab  Phone Note From Other Clinic   Caller: [facility] Summary of Call: [facility] from [facility] called w/ Critical lab report. Pts K+ is 6.4. Sample is fine, not hemolized.  Initial call taken by: Army Fossa CMA,  July 10, 2010 2:10 PM  Follow-up for Phone Call        Spoke with patient on work phone, per Dr.Hopper: 1.) Recheck potassium in the am 2.) Reviewed med list, no listed meds that would cause this  3.) Stop Citrus or any OTC potassium Follow-up by: Shonna Chock CMA,  July 10, 2010 2:33 PM

## 2010-11-14 ENCOUNTER — Other Ambulatory Visit (INDEPENDENT_AMBULATORY_CARE_PROVIDER_SITE_OTHER): Payer: Medicare Other

## 2010-11-14 DIAGNOSIS — E785 Hyperlipidemia, unspecified: Secondary | ICD-10-CM

## 2010-11-14 DIAGNOSIS — E559 Vitamin D deficiency, unspecified: Secondary | ICD-10-CM

## 2010-11-15 ENCOUNTER — Other Ambulatory Visit: Payer: Self-pay

## 2010-11-30 NOTE — Op Note (Signed)
NAMEBABS, DABBS                ACCOUNT NO.:  [id number]   MEDICAL RECORD NO.:  [id number]          PATIENT TYPE:  OIB   LOCATION:  5007                         FACILITY:  [facility]   PHYSICIAN:  Doralee Albino. Carola Frost, M.D. DATE OF BIRTH:  [birth year]   DATE OF PROCEDURE:  06/30/2005  DATE OF DISCHARGE:  06/29/2005                                 OPERATIVE REPORT   PREOPERATIVE DIAGNOSIS:  Left trimalleolar fracture.   POSTOPERATIVE DIAGNOSIS:  Left trimalleolar fracture.   PROCEDURE:  ORIF of trimalleolar ankle fracture without fixation of the  posterior lip.   SURGEON:  Doralee Albino. Carola Frost, M.D.   ASSISTANT:  Cecil Cranker, PA   ANESTHESIA:  General.   COMPLICATIONS:  None.   TOURNIQUET:  None.   ESTIMATED BLOOD LOSS:  Less than 80 cc.   DISPOSITION:  To PACU.   CONDITION:  Stable.   BRIEF SUMMARY AND INDICATIONS FOR PROCEDURE:  Tanya [name] is a 65 year old  female who initially presented to our clinic with a trimalleolar fracture  dislocation. She underwent a closed reduction using an ankle block, and has  been mobilized in a splint until resolution of her soft tissue swelling has  allowed for definitive treatment of the bimalleolar equivalent. The  posterior malleolus constituted less than 15% of  the articular surface; it  did not warrant additional internal fixation. After discussion of the risks  and benefits of surgery, including the possibility of infection, nerve  injury, vessel injury, need for further surgery, malunion, delayed union,  decreased range of motion and arthritis, the patient wished to proceed. She  also understood perioperative complications such as heart attack, stroke,  and embolism.   DESCRIPTION OF PROCEDURE:  Ms. Ditullio was administered preoperative  antibiotics; taken to operating room, where general anesthesia was induced.  Her left lower extremity was prepped and draped in the usual sterile  fashion.  A tourniquet was used. A standard  incision over the fibula was  made approximately 5 cm.  Dissection was carried carefully through the  superficial tissues to watch for the superficial peroneal nerve (which was  not directly encountered). Dissection continued deep onto the soft tissues  overlying the fibula. Periosteal layer was protected, except that the  fracture site where a 15 blade was used to scratch the periosteum just along  the edges of the fracture. This enabled [place] to perform an anatomic reduction,  after evacuating the fracture site of hematoma with curets and lavage.  It  was held with a tenaculum while 2 anterior-to- posterior lag screws were  placed using 3.5 drill to overdrill the near cortex, countersink device and  the 2.5 drill for the 4-cortex. A neutralization plate was then placed on  the lateral aspect of the fibula to assist in maintenance of this reduction.  Attention was then turned to the medial side, where a hockey-shaped incision  was made directly over the medial malleolus. Dissection was carried  carefully down to avoid injury to the saphenous nerve and vein, which was  retracted anteriorly. The fracture site was visualized, and again, a 15  blade  used to tease back the edges of the periosteum. We also looked into  the joint; did not see any loose cartilaginous debris, nor any full  thickness injury to the tibial surface nor the talar dome. The fracture  reduced very easily posteriorly and held quite well anteriorly.  The  fracture did extend just barely out onto the surface of the distal tibia.  Two partially threaded 35 mm cancellous screws were then placed  perpendicular to this fracture plane, which was performed under direct  visualization. This resulted in the screws being somewhat more anterior than  usual, but with excellent effect in terms of compression across the fracture  site. After placing these 2 screws, the wounds were copiously irrigated and  closed in standard layered fashion.  Prior to beginning the closure, an  external rotation stress view of the ankle was performed under live  fluoroscopy to evaluate for syndesmotic instability -- (there was none).  Sterile gently compressive dressing and posterior stirrup splint were  applied. The patient was awakened from anesthesia and transported to the  PACU in stable condition.   PROGNOSIS:  Ms. Koerner will be admitted overnight for observation, with plans  to discharge in the morning. She has been nonweightbearing at home, and has  done well with this.  We anticipate this will continue. When she returns in  2 weeks I well remove her staples and likely apply another short leg cast,  and then started a cam boot at 4 weeks, allowing for active and passive  flexion/extension of the ankle. She will take 10 days of Lovenox and then  transition to baby aspirin for DVT prophylaxis.      Doralee Albino. Carola Frost, M.D.  Electronically Signed     [facility]/MEDQ  D:  06/30/2005  T:  07/02/2005  Job:  [id number]

## 2010-11-30 NOTE — Assessment & Plan Note (Signed)
[facility]                        [place] OFFICE NOTE   Tanya [name], Tanya [name]                       MRN:          [id number]  DATE:11/10/2006                            DOB:          [birth year]    CHIEF CONCERN:  Dr. Blima Ledger, optometrist questions hypertensive  changes on fundal exam.  She denies epistaxis, significant headache or  cardiopulmonary symptoms.  Her mother did have hypertension and maternal  grandfather had a stroke.   Her past medical history is complicated but unchanged.  In 1998 she had  a mastectomy with reconstruction for breast cancer & had complete  hysterectomy and bilateral salpingo-oophorectomy for fibroids in 1990.  Arthroscopy was performed on the knee in 2003.  She had a fracture of  the ankle in 2006.  She is a gravida 1, para 1.  Colonoscopy in December  2007 was negative.  There is a history of thyroid nodules but is on no  thyroid medication.  She also has a history of dyslipidemia, which has  been treated with diet and exercise.   FAMILY HISTORY:  Negative for cancer, diabetes or heart attack.   She has never smoked; she drinks occasionally.  She may have an  intolerance to ORAL STEROIDS.   She is on Zyrtec 10 mg at bedtime if needed, Calcium 600 mg daily,  multivitamins, baby aspirin.   She has been walking a mile per day with no cardiopulmonary symptoms.  She is on no specific diet;however, she is considering nutritional  consultation because of approximately a 40 pound weight gain since she  had the fractured ankle.   REVIEW OF SYSTEMS:  Reveals no temperature intolerance or skin or hair  change of significance.  She has had chronic constipation.   She has intermittent paresthesias of the right foot.   She states that she felt she had some clinical depression following the  ankle fracture as she was sedentary and profoundly limited as to  physical  activity.   The remainder of the review of  systems is negative.   She is 5 feet 5, weight 245 which is a 38 pound weight gain since August  2006.  Pulse was 78, respiratory rate 15 and blood pressure 128/86.  On fundal exam she does have some arteriolar narrowing.  Dental hygiene  is excellent.  The otolaryngologic exam is unremarkable.  There is suggestion of nodule in the midline or the right lobe of the  thyroid noted with the swallowing maneuver.  She has no lymphadenopathy in the neck or axilla.  There is an S4 without obvious murmurs or gallops.  All pulses are intact.  There is no significant edema.  CHEST:  Clear.  There is organomegaly or masses or abdominal tenderness.  She does have crepitus of the knees.  Deep tendon reflexes are normal.  BREAST, PELVIC, RECTAL EXAM:  Are deferred to her Gynecologist.  NEUROPSYCHIATRIC EXAM:  Unremarkable.   Fasting labs will be collected.  Because of the weight gain and the  possible thyroid nodules complete thyroid function tests were drawn.   Additionally because of the weight  gain and question of hypertension,  hemoglobin A1c will be drawn to assess diabetic risk.  Based on review  of the chart her LDL goal should be less than 125.   A goal sheet was provided.  Her nutrition consultation can be completed  once the lipids and A1c are reviewed.  I will ask her to visit the  Prevention.com web site [employer] Diet which is heart healthy and low  carbs.  I will also recommend that she avoid foods with high fructose  corn syrup as the 1st, 2nd or 3rd ingredient as a prelude to the  nutritional consult.     Titus Dubin. Alwyn Ren, MD,FACP,FCCP  Electronically Signed    [facility]/MedQ  DD: 11/10/2006  DT: 11/10/2006  Job #: [id number]   cc:   Daleen Bo, Dr. Hyacinth Meeker

## 2010-12-13 ENCOUNTER — Encounter: Payer: Self-pay | Admitting: Internal Medicine

## 2011-01-29 ENCOUNTER — Ambulatory Visit (INDEPENDENT_AMBULATORY_CARE_PROVIDER_SITE_OTHER): Payer: Medicare Other | Admitting: Internal Medicine

## 2011-01-29 ENCOUNTER — Encounter: Payer: Self-pay | Admitting: Internal Medicine

## 2011-01-29 DIAGNOSIS — E559 Vitamin D deficiency, unspecified: Secondary | ICD-10-CM

## 2011-01-29 DIAGNOSIS — R7309 Other abnormal glucose: Secondary | ICD-10-CM

## 2011-01-29 DIAGNOSIS — E785 Hyperlipidemia, unspecified: Secondary | ICD-10-CM

## 2011-01-29 MED ORDER — SIMVASTATIN 20 MG PO TABS
20.0000 mg | ORAL_TABLET | Freq: Every evening | ORAL | Status: DC
Start: 2011-01-29 — End: 2011-04-30

## 2011-01-29 MED ORDER — NIACIN ER (ANTIHYPERLIPIDEMIC) 500 MG PO TBCR: 500.0000 mg | EXTENDED_RELEASE_TABLET | Freq: Every day | ORAL | Status: DC

## 2011-01-29 NOTE — Progress Notes (Signed)
  Subjective:    Patient ID: Tanya [name], female    DOB: 1946-05-27, 65 y.o.   MRN: [id number]  HPI Dyslipidemia assessment: Prior Advanced Lipid Testing: NMR LDL goal = < 100..   Family history of premature CAD/ MI: MGM MI @ 57 .  Nutrition: decreased carbs .  Exercise: biking @ gym 3X/ week; walking 1/2 mpd  2X/ week . Diabetes : A1c 5.6% on BHP . HTN: no. Smoking history  : never .   Weight :  stable. Lab results reviewed :[facility] panel risks: LDL 136, Lp(A) 118, TG 149,   Vitamin D level was 39, lower limits of normal. She's been on 50,000 international units once in 2 weeks. .     Review of Systems  ROS: fatigue: no ; chest pain : no ;claudication: no; palpitations: no; abd pain/bowel changes: no ; myalgias:no;  syncope : no ; memory loss: no;skin changes: no.      Objective:   Physical Exam Gen.: Healthy and well-nourished in appearance. Alert, appropriate and cooperative throughout exam. Neck: No deformities, masses, or tenderness noted.  Thyroid normal. Lungs: Normal respiratory effort; chest expands symmetrically. Lungs are clear to auscultation without rales, wheezes, or increased work of breathing. Heart: Normal rate and rhythm. Normal S1 and S2. No gallop, click, or rub. No murmur. Abdomen: Bowel sounds normal; abdomen soft and nontender. No masses, organomegaly or hernias noted.No AAA or bruits                                                                                   Musculoskeletal/extremities:  No clubbing, cyanosis, edema, or deformity noted. Varus deformity of knes. Nail health  good. Vascular: Carotid, radial artery, dorsalis pedis and  posterior tibial pulses are full and equal. No bruits present. Neurologic: Alert and oriented x3.  Skin: Intact without suspicious lesions or rashes.  Psych: Mood and affect are normal. Normally interactive                                                                                         Assessment & Plan:  #1  dyslipidemia; no family history of premature coronary disease. Elevated Lp(a). and high normal triglycerides. Risk and options discussed. Niaspan trial. Change to Simvastatin  #2 vitamin D deficiency. Because of the controversy with vitamin D; it will be recommended that she take 2000 international units of vitamin D 3 daily and recheck the vitamin D level in 6 months.

## 2011-01-29 NOTE — Patient Instructions (Signed)
Preventive Health Care: Exercise  30-45  minutes a day, 3-4 days a week. Walking is especially valuable in preventing Osteoporosis. Eat a low-fat diet with lots of fruits and vegetables, up to 7-9 servings per day. Avoid obesity; your goal = waist less than 35 inches.Consume less than 30 grams of sugar per day from foods & drinks with High Fructose Corn Syrup as #2,3 or #4 on label. Follow the low carb nutrition program in [employer] as closely as possible to prevent Diabetes . Please  schedule fasting Labs : Lipids, hepatic panel in 10 weeks

## 2011-04-30 ENCOUNTER — Other Ambulatory Visit: Payer: Self-pay | Admitting: *Deleted

## 2011-04-30 DIAGNOSIS — E785 Hyperlipidemia, unspecified: Secondary | ICD-10-CM

## 2011-04-30 MED ORDER — SIMVASTATIN 20 MG PO TABS: 20.0000 mg | ORAL_TABLET | Freq: Every evening | ORAL | Status: DC

## 2011-04-30 MED ORDER — NIACIN ER (ANTIHYPERLIPIDEMIC) 500 MG PO TBCR: 500.0000 mg | EXTENDED_RELEASE_TABLET | Freq: Every day | ORAL | Status: DC

## 2011-07-22 ENCOUNTER — Other Ambulatory Visit: Payer: Self-pay | Admitting: Internal Medicine

## 2011-08-19 ENCOUNTER — Ambulatory Visit (INDEPENDENT_AMBULATORY_CARE_PROVIDER_SITE_OTHER): Payer: Medicare Other | Admitting: Internal Medicine

## 2011-08-19 ENCOUNTER — Encounter: Payer: Self-pay | Admitting: Internal Medicine

## 2011-08-19 VITALS — BP 116/70 | HR 78 | Temp 97.6°F | Resp 12 | Ht 64.0 in | Wt 255.4 lb

## 2011-08-19 DIAGNOSIS — M1990 Unspecified osteoarthritis, unspecified site: Secondary | ICD-10-CM

## 2011-08-19 DIAGNOSIS — Z0000 Encounter for general adult medical examination without abnormal findings: Secondary | ICD-10-CM

## 2011-08-19 DIAGNOSIS — E042 Nontoxic multinodular goiter: Secondary | ICD-10-CM

## 2011-08-19 DIAGNOSIS — E559 Vitamin D deficiency, unspecified: Secondary | ICD-10-CM

## 2011-08-19 DIAGNOSIS — Z853 Personal history of malignant neoplasm of breast: Secondary | ICD-10-CM

## 2011-08-19 DIAGNOSIS — R7309 Other abnormal glucose: Secondary | ICD-10-CM

## 2011-08-19 DIAGNOSIS — E785 Hyperlipidemia, unspecified: Secondary | ICD-10-CM

## 2011-08-19 LAB — HEPATIC FUNCTION PANEL
ALT: 19 U/L (ref 0–35)
Albumin: 4.1 g/dL (ref 3.5–5.2)
Alkaline Phosphatase: 75 U/L (ref 39–117)
Bilirubin, Direct: 0.1 mg/dL (ref 0.0–0.3)
Total Protein: 7.3 g/dL (ref 6.0–8.3)

## 2011-08-19 LAB — CBC WITH DIFFERENTIAL/PLATELET
Basophils Absolute: 0.1 10*3/uL (ref 0.0–0.1)
Basophils Relative: 1.1 % (ref 0.0–3.0)
Eosinophils Absolute: 0.3 10*3/uL (ref 0.0–0.7)
Hemoglobin: 14.5 g/dL (ref 12.0–15.0)
Lymphocytes Relative: 29.4 % (ref 12.0–46.0)
MCHC: 33.3 g/dL (ref 30.0–36.0)
Monocytes Relative: 9.5 % (ref 3.0–12.0)
Neutro Abs: 4.4 10*3/uL (ref 1.4–7.7)
Neutrophils Relative %: 55.7 % (ref 43.0–77.0)
RBC: 4.69 Mil/uL (ref 3.87–5.11)
RDW: 12.9 % (ref 11.5–14.6)

## 2011-08-19 LAB — BASIC METABOLIC PANEL
Calcium: 9 mg/dL (ref 8.4–10.5)
Creatinine, Ser: 0.8 mg/dL (ref 0.4–1.2)
GFR: 79.25 mL/min (ref >60.00)
Sodium: 138 mEq/L (ref 135–145)

## 2011-08-19 LAB — LIPID PANEL
Cholesterol: 182 mg/dL (ref 0–200)
LDL Cholesterol: 99 mg/dL (ref 0–99)
Triglycerides: 123 mg/dL (ref 0.0–149.0)

## 2011-08-19 LAB — HEMOGLOBIN A1C: Hgb A1c MFr Bld: 5.8 % (ref 4.6–6.5)

## 2011-08-19 MED ORDER — NIACIN ER (ANTIHYPERLIPIDEMIC) 500 MG PO TBCR: 500.0000 mg | EXTENDED_RELEASE_TABLET | Freq: Every day | ORAL | Status: DC

## 2011-08-19 NOTE — Progress Notes (Signed)
Subjective:    Patient ID: Tanya [name], female    DOB: 1946-03-04, 66 y.o.   MRN: [id number]  HPI Medicare Wellness Visit:  The following psychosocial & medical history were reviewed as required by Medicare.   Social history: caffeine: 1 cup/ day , alcohol:  rare ,  tobacco use : never & exercise : not able due to DJD of knee(surgery to be scheduled  By Dr Despina Hick).   Home & personal  safety / fall risk:she exerts care with walking, activities of daily living: no limitations , seatbelt use : yes , and smoke alarm employment : yes .  Power of Attorney/Living Will status :in place  Vision ( as recorded per Nurse) & Hearing  evaluation : see exam. Orientation :orientedX 3 , memory & recall :good, spelling  testing: good ,and mood & affect : normal . Depression / anxiety: denied Travel history : [place] , immunization status :up to date , transfusion history:  no, and preventive health surveillance ( colonoscopies, BMD , etc as per protocol/ [facility]): colonoscopy up to date, Dental care: seen every 6 months . Chart reviewed &  Updated. Active issues reviewed & addressed.       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 She has had a steroid injection with only temporary response. She uses or arthritis strength Tylenol as needed with some response. The pain is exacerbated by any ambulation. The only  exercise she is able to do is  stationary bike @  gym          Objective:   Physical Exam Gen.:   well-nourished in appearance. Alert, appropriate and cooperative throughout exam. Head: Normocephalic without obvious abnormalities Eyes: No corneal or conjunctival inflammation noted. Pupils equal round reactive to light and accommodation. Fundal exam is benign without hemorrhages, exudate, papilledema. Extraocular motion intact. Vision grossly normal with lenses. Ears: External  ear exam reveals no significant lesions or deformities. Canals clear .TMs normal. Hearing is grossly normal bilaterally to whisper . Nose: External nasal exam reveals no deformity or inflammation. Nasal mucosa are pink and moist. No lesions or exudates noted. Mouth: Oral mucosa and oropharynx reveal no lesions or exudates. Teeth in good repair. Neck: No deformities, masses, or tenderness noted. Range of motion normal. Thyroid small goiter suggested on R Lungs: Normal respiratory effort; chest expands symmetrically. Lungs are clear to auscultation without rales, wheezes, or increased work of breathing. Heart: Normal rate and rhythm. Normal S1 and S2. No gallop, click, or rub. No murmur. Abdomen: Bowel sounds normal; abdomen soft and nontender. No masses, organomegaly or hernias noted. Genitalia: Shirlyn Goltz, NP .  Musculoskeletal/extremities: No deformity or scoliosis noted of  the thoracic or lumbar spine. No clubbing or cyanosis,  noted. Range of motion decreased @ knees; fusiform changes of the knees with crepitus. Lympedema LUE.Tone & strength  normal. Nail health  Good.. Vascular: Carotid, radial artery, dorsalis pedis and  posterior tibial pulses are full and equal. No bruits present. Neurologic: Alert and oriented x3. Arm deep tendon reflexes symmetrical and normal.          Skin: Intact without suspicious lesions or rashes. Lymph: No cervical, axillary lymphadenopathy present. Psych: Mood and affect are normal. Normally interactive                                                                                         Assessment & Plan:  #1 Medicare Wellness Exam; criteria met ; data entered #2 Problem List reviewed ; Assessment/ Recommendations made Plan: see Orders    EKG reveals 2 unifocal PVCs. No ischemic changes are present. T wave is slightly flat in V6.

## 2011-08-19 NOTE — Assessment & Plan Note (Signed)
50,000 international units of vitamin D 3 every other week  was prescribed by her gynecologist office ; vitamin D level is needed.

## 2011-08-19 NOTE — Patient Instructions (Addendum)
Eat a low-fat diet with lots of fruits and vegetables, up to 7-9 servings per day. Consume less than 30 (preferably ZERO)grams of sugar per day from foods & drinks with High Fructose Corn Syrup (HFCS) sugar as #1,2,3 or # 4 on label.Whole Foods, [employer] & [employer] do not carry products with HFCS. Follow a  low carb nutrition program such as [place] or [employer]  to prevent Diabetes progression . White carbohydrates (potatoes, rice, bread, and pasta) have a high spike of sugar and a high load of sugar. For example a  baked potato has a cup of sugar and a  french fry  2 teaspoons of sugar. Yams, wild  rice, whole grained bread &  wheat pasta have been much lower spike and load of  sugar. Portions should be the size of a deck of cards or your palm.   The normal goal for  Vitamin D is 40-60. Vitamin D, along with calcium( 600 mg twice a day) are essential for bone health. Vitamin D is the # 1 cause of muscle pain in women.  To prevent palpitations or premature beats, avoid stimulants such as decongestants, diet pills, nicotine, or caffeine (coffee, tea, cola, or chocolate) to excess.

## 2011-10-26 ENCOUNTER — Other Ambulatory Visit: Payer: Self-pay | Admitting: Internal Medicine

## 2011-11-10 ENCOUNTER — Other Ambulatory Visit: Payer: Self-pay | Admitting: Orthopedic Surgery

## 2011-11-10 MED ORDER — BUPIVACAINE 0.25 % ON-Q PUMP SINGLE CATH 300ML: 300.0000 mL | INJECTION | Status: DC

## 2011-11-10 MED ORDER — DEXAMETHASONE SODIUM PHOSPHATE 10 MG/ML IJ SOLN: 10.0000 mg | Freq: Once | INTRAMUSCULAR | Status: DC

## 2011-11-13 ENCOUNTER — Telehealth: Payer: Self-pay | Admitting: Internal Medicine

## 2011-11-13 NOTE — Telephone Encounter (Signed)
Dr.Hopper please advise if you have seen this paperwork

## 2011-11-13 NOTE — Telephone Encounter (Signed)
Pt called today to find out if we have received clearance form from [facility]. She can be reached at work at [phone] and we can leave a message on that voicemail.

## 2011-11-13 NOTE — Telephone Encounter (Signed)
Left message on voicemail per Dr.Hopper patient cleared if no changes

## 2012-02-12 ENCOUNTER — Encounter (HOSPITAL_COMMUNITY): Payer: Self-pay | Admitting: Pharmacy Technician

## 2012-02-17 NOTE — Patient Instructions (Signed)
20 Tanya [name]  02/17/2012   Your procedure is scheduled on:  02/24/12 [id number]  Report to [facility] at 0515 AM.  Call this number if you have problems the morning of surgery: [phone]   Remember:   Do not eat food:After Midnight.  May have clear liquids:until Midnight .  Marland Kitchen  Take these medicines the morning of surgery with A SIP OF WATER:    Do not wear jewelry, make-up or nail polish.  Do not wear lotions, powders, or perfumes.  Do not shave 48 hours prior to surgery  Do not bring valuables to the hospital.  Contacts, dentures or bridgework may not be worn into surgery.  Leave suitcase in the car. After surgery it may be brought to your room.  For patients admitted to the hospital, checkout time is 11:00 AM the day of discharge.    Special Instructions: CHG Shower Use Special Wash: 1/2 bottle night before surgery and 1/2 bottle morning of surgery. shower chin to toes with CHG.  Wash face and private parts with regular soap.    Please read over the following fact sheets that you were given: MRSA Information, Blood Transfusion Fact Sheet, Incentive Spirometry Fact sheet, coughing and deep breathing exercises, leg exercises

## 2012-02-18 ENCOUNTER — Encounter (HOSPITAL_COMMUNITY): Payer: Self-pay

## 2012-02-18 ENCOUNTER — Ambulatory Visit (HOSPITAL_COMMUNITY)
Admission: RE | Admit: 2012-02-18 | Discharge: 2012-02-18 | Disposition: A | Discharge status: Home / Self Care | Payer: Medicare Other | Source: Ambulatory Visit | Attending: Orthopedic Surgery | Admitting: Orthopedic Surgery

## 2012-02-18 ENCOUNTER — Encounter (HOSPITAL_COMMUNITY)
Admission: RE | Admit: 2012-02-18 | Discharge: 2012-02-18 | Disposition: A | Discharge status: Home / Self Care | Payer: Medicare Other | Source: Ambulatory Visit | Attending: Orthopedic Surgery | Admitting: Orthopedic Surgery

## 2012-02-18 DIAGNOSIS — Z01818 Encounter for other preprocedural examination: Secondary | ICD-10-CM | POA: Insufficient documentation

## 2012-02-18 DIAGNOSIS — Z01812 Encounter for preprocedural laboratory examination: Secondary | ICD-10-CM | POA: Insufficient documentation

## 2012-02-18 DIAGNOSIS — M1710 Unilateral primary osteoarthritis, unspecified knee: Secondary | ICD-10-CM | POA: Insufficient documentation

## 2012-02-18 HISTORY — DX: Other specified postprocedural states: Z98.890

## 2012-02-18 HISTORY — DX: Unspecified osteoarthritis, unspecified site: M19.90

## 2012-02-18 HISTORY — DX: Malignant (primary) neoplasm, unspecified: C80.1

## 2012-02-18 HISTORY — DX: Nausea with vomiting, unspecified: R11.2

## 2012-02-18 LAB — CBC
MCV: 91.2 fL (ref 78.0–100.0)
Platelets: 326 10*3/uL (ref 150–400)
RBC: 4.89 MIL/uL (ref 3.87–5.11)
WBC: 6.8 10*3/uL (ref 4.0–10.5)

## 2012-02-18 LAB — DIFFERENTIAL
Lymphocytes Relative: 33 % (ref 12–46)
Lymphs Abs: 2.2 10*3/uL (ref 0.7–4.0)
Neutrophils Relative %: 56 % (ref 43–77)

## 2012-02-18 LAB — COMPREHENSIVE METABOLIC PANEL
ALT: 16 U/L (ref 0–35)
Alkaline Phosphatase: 76 U/L (ref 39–117)
CO2: 28 mEq/L (ref 19–32)
Chloride: 102 mEq/L (ref 96–112)
GFR calc Af Amer: 86 mL/min — ABNORMAL LOW (ref >90)
GFR calc non Af Amer: 74 mL/min — ABNORMAL LOW (ref >90)
Glucose, Bld: 92 mg/dL (ref 70–99)
Potassium: 4.3 mEq/L (ref 3.5–5.1)
Sodium: 138 mEq/L (ref 135–145)

## 2012-02-18 LAB — URINALYSIS, ROUTINE W REFLEX MICROSCOPIC
Bilirubin Urine: NEGATIVE
Glucose, UA: NEGATIVE mg/dL
Ketones, ur: NEGATIVE mg/dL
pH: 7 (ref 5.0–8.0)

## 2012-02-18 LAB — URINE MICROSCOPIC-ADD ON

## 2012-02-18 LAB — SURGICAL PCR SCREEN: MRSA, PCR: NEGATIVE

## 2012-02-18 NOTE — Progress Notes (Signed)
Clearance note on chart from Dr Alwyn Ren  LOV note - Dr Alwyn Ren- EPIC 08/19/11 EKG 08/19/11 EPIC

## 2012-02-18 NOTE — Progress Notes (Signed)
Faxed and confirmation received to office of Dr Lequita Halt  To report abnormal urinalysis and micro results.

## 2012-02-21 ENCOUNTER — Other Ambulatory Visit: Payer: Self-pay | Admitting: Orthopedic Surgery

## 2012-02-21 MED ORDER — BUPIVACAINE 0.25 % ON-Q PUMP SINGLE CATH 300ML: 300.0000 mL | INJECTION | Status: DC

## 2012-02-21 NOTE — Progress Notes (Signed)
Preoperative surgical orders have been place into the Epic hospital system for Tanya [name] on 02/21/2012, 3:23 PM  by Patrica Duel for surgery on 02/24/12.  Preop Total Knee orders including Bupivacaine On-Q pump, IV Tylenol, as long as there are no contraindications to the above medications. [name], PA-C  These orders were originally entered on 11/10/11 but due to a default 90 day rule, the orders were deleted automatically by the EPIC system requiring them to be reentered again creating more work to be done over again. [name], [facility]

## 2012-02-21 NOTE — Progress Notes (Signed)
Left message on voice mail of Tanya [name] regarding no orders in [facility] and surgery scheduled for 02/24/12 at 0715am.  2 Faxed requests have already been sent with confirmation receivd.

## 2012-02-23 ENCOUNTER — Other Ambulatory Visit: Payer: Self-pay | Admitting: Orthopedic Surgery

## 2012-02-23 NOTE — Anesthesia Preprocedure Evaluation (Addendum)
Anesthesia Evaluation  Patient identified by MRN, date of birth, ID band Patient awake    Reviewed: Allergy & Precautions, H&P , NPO status , Patient's Chart, lab work & pertinent test results  History of Anesthesia Complications (+) PONV  Airway Mallampati: II TM Distance: >3 FB Neck ROM: Full    Dental  (+) Teeth Intact and Dental Advisory Given   Pulmonary    Pulmonary exam normal       Cardiovascular Exercise Tolerance: Good Rhythm:Regular Rate:Normal     Neuro/Psych negative neurological ROS     GI/Hepatic   Endo/Other  Morbid obesity  Renal/GU      Musculoskeletal   Abdominal (+) + obese,   Peds  Hematology   Anesthesia Other Findings   Reproductive/Obstetrics                          Anesthesia Physical Anesthesia Plan  ASA: II  Anesthesia Plan: General   Post-op Pain Management:    Induction: Intravenous  Airway Management Planned:   Additional Equipment:   Intra-op Plan:   Post-operative Plan: Extubation in OR  Informed Consent: I have reviewed the patients History and Physical, chart, labs and discussed the procedure including the risks, benefits and alternatives for the proposed anesthesia with the patient or authorized representative who has indicated his/her understanding and acceptance.   Dental advisory given  Plan Discussed with: CRNA and Surgeon  Anesthesia Plan Comments:        Anesthesia Quick Evaluation

## 2012-02-23 NOTE — H&P (Signed)
Tanya [name]  DOB: 07/11/1946 Married / Language: English / Race: White / Female  Date of Admission:  02/24/2012  Chief Complaint:  Right knee Pain  History of Present Illness The patient is a 66 year old female who comes in for a preoperative History and Physical. The patient is scheduled for a right total knee arthroplasty to be performed by Dr. Frank V. Aluisio, MD at [facility] on 02/24/2012. The patient is a 65 year old female who presents with knee complaints. The patient is seen today for a second opinion. The patient reports right knee symptoms including: pain, swelling and grinding which began 4 year(s) ago without any known injury. Note for "Knee pain": She had a knee scope in 2011. Dr. Murphy told her she needed to have a total knee arthroplasty. She has been putting it off for years she says. She states the knee is hurting at all times. This is limiting what she can and can not do. The knee feels like it wants to give out at times. Dr. Murphy scoped her in 2011 and told her she needed a total knee at that point. She has been trying to hold off. She is now at a stage she can not tolerate this anymore. She has pain day and night. The knee does give out. She is unable to do the things she desires. They have been treated conservatively in the past for the above stated problem and despite conservative measures, they continue to have progressive pain and severe functional limitations and dysfunction. They have failed non-operative management. It is felt that they would benefit from undergoing total joint replacement. Risks and benefits of the procedure have been discussed with the patient and they elect to proceed with surgery. There are no active contraindications to surgery such as ongoing infection or rapidly progressive neurological disease.   Problem List/Past Medical Osteoarthritis, Knee (715.96) Osteoporosis Skin Cancer Breast Cancer Cataract  Allergies No Known  Drug Allergies   Family History Cerebrovascular Accident. grandfather mothers side Heart Disease. grandfather mothers side Osteoarthritis. mother   Social History Drug/Alcohol Rehab (Previously). no Exercise. Exercises weekly; does running / walking Illicit drug use. no Children. 1 Current work status. working full time Drug/Alcohol Rehab (Currently). no Tobacco use. never smoker Living situation. live with spouse Marital status. married Pain Contract. no Alcohol use. current drinker; drinks wine; only occasionally per week   Medication History Simvastatin (20MG Tablet, Oral) Active. Niaspan (500MG Tablet ER, Oral) Active. Calcium Carbonate (1250MG Tablet, Oral) Active. ZyrTEC Allergy (10MG Tablet, Oral) Active. Aspirin EC (81MG Tablet DR, Oral) Active.   Past Surgical History Hysterectomy. complete (non-cancerous) Mastectomy. bilateral Breast Reconstruction. bilateral Ankle Surgery. left Arthroscopy of Knee. bilateral  Review of Systems General:Not Present- Chills, Fever, Night Sweats, Fatigue, Weight Gain, Weight Loss and Memory Loss. Skin:Not Present- Hives, Itching, Rash, Eczema and Lesions. HEENT:Not Present- Tinnitus, Headache, Double Vision, Visual Loss, Hearing Loss and Dentures. Respiratory:Not Present- Shortness of breath with exertion, Shortness of breath at rest, Allergies, Coughing up blood and Chronic Cough. Cardiovascular:Not Present- Chest Pain, Racing/skipping heartbeats, Difficulty Breathing Lying Down, Murmur, Swelling and Palpitations. Gastrointestinal:Not Present- Bloody Stool, Heartburn, Abdominal Pain, Vomiting, Nausea, Constipation, Diarrhea, Difficulty Swallowing, Jaundice and Loss of appetitie. Female Genitourinary:Not Present- Blood in Urine, Urinary frequency, Weak urinary stream, Discharge, Flank Pain, Incontinence, Painful Urination, Urgency, Urinary Retention and Urinating at Night. Musculoskeletal:Present-  Muscle Weakness, Muscle Pain, Joint Swelling, Joint Pain and Morning Stiffness. Not Present- Back Pain and Spasms. Neurological:Not Present- Tremor, Dizziness,   Blackout spells, Paralysis, Difficulty with balance and Weakness. Psychiatric:Not Present- Insomnia.   Vitals Weight: 240 lb Height: 65 in Body Surface Area: 2.23 m Body Mass Index: 39.94 kg/m Pulse: 72 (Regular) Resp.: 12 (Unlabored) BP: 172/94 (Sitting, Right Arm, Standard)    Physical Exam The physical exam findings are as follows:  Patient is a 66 year old female with continued knee pain.   General Mental Status - Alert, cooperative and good historian. General Appearance- pleasant. Not in acute distress. Orientation- Oriented X3. Build & Nutrition- Well nourished and Well developed.   Head and Neck Head- normocephalic, atraumatic . Neck Global Assessment- supple. no bruit auscultated on the right and no bruit auscultated on the left.   Eye Pupil- Bilateral- Regular and Round. Motion- Bilateral- EOMI.   Chest and Lung Exam Auscultation: Breath sounds:- clear at anterior chest wall and - clear at posterior chest wall. Adventitious sounds:- No Adventitious sounds.   Cardiovascular Auscultation:Rhythm- Regular rate and rhythm. Heart Sounds- S1 WNL and S2 WNL. Murmurs & Other Heart Sounds:Auscultation of the heart reveals - No Murmurs.   Abdomen Inspection:Contour- Generalized moderate distention. Palpation/Percussion:Tenderness- Abdomen is non-tender to palpation. Rigidity (guarding)- Abdomen is soft. Auscultation:Auscultation of the abdomen reveals - Bowel sounds normal.   Female Genitourinary Not done, not pertinent to present illness  Musculoskeletal  She is alert and oriented. She is in no apparent distress. Evaluation of her hips show a normal range of motion, no discomfort. The left knee exam is unremarkable. The right knee range is from 10-120. There  is marked crepitus on range of motion of the right knee. She is tender medial greater than lateral. No instability. RADIOGRAPHS: AP both knees and lateral show tricompartmental degenerative changes in the right knee. The left knee has some arthritis, no where near as bad as the right.  Assessment & Plan Osteoarthritis Right Knee  Note: Patient is for a Right Total Knee Repalcement by Dr. Aluisio.  Plan is to go home.  She has some lymphedema in the left arm following surgery so need to avoid BP's ansd IV's in the left arm.  PCP - Dr. William Hopper - Patient has been seen preoperatively and felt to be stable for surgery.  Signed electronically by DREW L Hawley Michel, PA-C  

## 2012-02-24 ENCOUNTER — Inpatient Hospital Stay (HOSPITAL_COMMUNITY)
Admission: RE | Admit: 2012-02-24 | Discharge: 2012-02-27 | DRG: 470 | Disposition: A | Discharge status: Home Health | Payer: Medicare Other | Source: Ambulatory Visit | Attending: Orthopedic Surgery | Admitting: Orthopedic Surgery

## 2012-02-24 ENCOUNTER — Ambulatory Visit (HOSPITAL_COMMUNITY): Payer: Medicare Other | Admitting: Anesthesiology

## 2012-02-24 ENCOUNTER — Encounter (HOSPITAL_COMMUNITY)
Admission: RE | Disposition: A | Discharge status: Home Health | Payer: Self-pay | Source: Ambulatory Visit | Attending: Orthopedic Surgery

## 2012-02-24 ENCOUNTER — Encounter (HOSPITAL_COMMUNITY): Payer: Self-pay

## 2012-02-24 ENCOUNTER — Encounter (HOSPITAL_COMMUNITY): Payer: Self-pay | Admitting: Anesthesiology

## 2012-02-24 DIAGNOSIS — Z96659 Presence of unspecified artificial knee joint: Secondary | ICD-10-CM

## 2012-02-24 DIAGNOSIS — M1710 Unilateral primary osteoarthritis, unspecified knee: Secondary | ICD-10-CM

## 2012-02-24 DIAGNOSIS — D500 Iron deficiency anemia secondary to blood loss (chronic): Secondary | ICD-10-CM

## 2012-02-24 DIAGNOSIS — M179 Osteoarthritis of knee, unspecified: Secondary | ICD-10-CM | POA: Diagnosis present

## 2012-02-24 DIAGNOSIS — J9811 Atelectasis: Secondary | ICD-10-CM | POA: Diagnosis not present

## 2012-02-24 DIAGNOSIS — R339 Retention of urine, unspecified: Secondary | ICD-10-CM | POA: Diagnosis not present

## 2012-02-24 DIAGNOSIS — D62 Acute posthemorrhagic anemia: Secondary | ICD-10-CM | POA: Diagnosis not present

## 2012-02-24 DIAGNOSIS — E785 Hyperlipidemia, unspecified: Secondary | ICD-10-CM

## 2012-02-24 HISTORY — PX: TOTAL KNEE ARTHROPLASTY: SHX125

## 2012-02-24 LAB — ABO/RH: ABO/RH(D): O POS

## 2012-02-24 LAB — TYPE AND SCREEN
ABO/RH(D): O POS
Antibody Screen: NEGATIVE

## 2012-02-24 SURGERY — ARTHROPLASTY, KNEE, TOTAL
Anesthesia: General | Site: Knee | Laterality: Right | Wound class: Clean

## 2012-02-24 MED ORDER — MEPERIDINE HCL 50 MG/ML IJ SOLN: 6.2500 mg | INTRAMUSCULAR | Status: DC | PRN

## 2012-02-24 MED ORDER — ONDANSETRON HCL 4 MG/2ML IJ SOLN
4.0000 mg | Freq: Four times a day (QID) | INTRAMUSCULAR | Status: DC | PRN
  Administered 2012-02-25: 4 mg via INTRAVENOUS
  Filled 2012-02-24: qty 2

## 2012-02-24 MED ORDER — METOCLOPRAMIDE HCL 10 MG PO TABS: 5.0000 mg | ORAL_TABLET | Freq: Three times a day (TID) | ORAL | Status: DC | PRN

## 2012-02-24 MED ORDER — SODIUM CHLORIDE 0.9 % IJ SOLN: 9.0000 mL | INTRAMUSCULAR | Status: DC | PRN

## 2012-02-24 MED ORDER — MORPHINE SULFATE (PF) 1 MG/ML IV SOLN
INTRAVENOUS | Status: AC
  Filled 2012-02-24: qty 25

## 2012-02-24 MED ORDER — ACETAMINOPHEN 10 MG/ML IV SOLN
1000.0000 mg | Freq: Four times a day (QID) | INTRAVENOUS | Status: AC
  Administered 2012-02-24 – 2012-02-25 (×4): 1000 mg via INTRAVENOUS
  Filled 2012-02-24 (×7): qty 100

## 2012-02-24 MED ORDER — RIVAROXABAN 10 MG PO TABS
10.0000 mg | ORAL_TABLET | Freq: Every day | ORAL | Status: DC
  Administered 2012-02-25 – 2012-02-27 (×3): 10 mg via ORAL
  Filled 2012-02-24 (×4): qty 1

## 2012-02-24 MED ORDER — ONDANSETRON HCL 4 MG PO TABS: 4.0000 mg | ORAL_TABLET | Freq: Four times a day (QID) | ORAL | Status: DC | PRN

## 2012-02-24 MED ORDER — OXYCODONE HCL 5 MG PO TABS
5.0000 mg | ORAL_TABLET | ORAL | Status: DC | PRN
  Administered 2012-02-24: 10 mg via ORAL
  Administered 2012-02-24: 5 mg via ORAL
  Administered 2012-02-25 (×3): 10 mg via ORAL
  Administered 2012-02-26 – 2012-02-27 (×5): 5 mg via ORAL
  Filled 2012-02-24 (×3): qty 1
  Filled 2012-02-24: qty 2
  Filled 2012-02-24: qty 1
  Filled 2012-02-24 (×3): qty 2
  Filled 2012-02-24 (×2): qty 1

## 2012-02-24 MED ORDER — METOCLOPRAMIDE HCL 5 MG/ML IJ SOLN
5.0000 mg | Freq: Three times a day (TID) | INTRAMUSCULAR | Status: DC | PRN
  Administered 2012-02-25: 10 mg via INTRAVENOUS
  Filled 2012-02-24: qty 2

## 2012-02-24 MED ORDER — DIPHENHYDRAMINE HCL 12.5 MG/5ML PO ELIX
12.5000 mg | ORAL_SOLUTION | ORAL | Status: DC | PRN
  Administered 2012-02-26: 25 mg via ORAL
  Filled 2012-02-24: qty 10

## 2012-02-24 MED ORDER — PROMETHAZINE HCL 25 MG/ML IJ SOLN: 6.2500 mg | INTRAMUSCULAR | Status: DC | PRN

## 2012-02-24 MED ORDER — FLEET ENEMA 7-19 GM/118ML RE ENEM: 1.0000 | ENEMA | Freq: Once | RECTAL | Status: AC | PRN

## 2012-02-24 MED ORDER — HYDROMORPHONE HCL PF 1 MG/ML IJ SOLN: 0.2500 mg | INTRAMUSCULAR | Status: DC | PRN

## 2012-02-24 MED ORDER — ACETAMINOPHEN 650 MG RE SUPP: 650.0000 mg | Freq: Four times a day (QID) | RECTAL | Status: DC | PRN

## 2012-02-24 MED ORDER — SIMVASTATIN 20 MG PO TABS
20.0000 mg | ORAL_TABLET | Freq: Every evening | ORAL | Status: DC
  Administered 2012-02-24 – 2012-02-26 (×3): 20 mg via ORAL
  Filled 2012-02-24 (×4): qty 1

## 2012-02-24 MED ORDER — ONDANSETRON HCL 4 MG/2ML IJ SOLN: 4.0000 mg | Freq: Four times a day (QID) | INTRAMUSCULAR | Status: DC | PRN

## 2012-02-24 MED ORDER — FENTANYL CITRATE 0.05 MG/ML IJ SOLN
INTRAMUSCULAR | Status: DC | PRN
  Administered 2012-02-24: 15 ug via INTRATHECAL
  Administered 2012-02-24: 10 ug via INTRAVENOUS

## 2012-02-24 MED ORDER — METHOCARBAMOL 500 MG PO TABS
500.0000 mg | ORAL_TABLET | Freq: Four times a day (QID) | ORAL | Status: DC | PRN
  Administered 2012-02-25 – 2012-02-27 (×5): 500 mg via ORAL
  Filled 2012-02-24 (×5): qty 1

## 2012-02-24 MED ORDER — DOCUSATE SODIUM 100 MG PO CAPS
100.0000 mg | ORAL_CAPSULE | Freq: Two times a day (BID) | ORAL | Status: DC
  Administered 2012-02-24 – 2012-02-27 (×7): 100 mg via ORAL

## 2012-02-24 MED ORDER — BUPIVACAINE HCL 0.75 % IJ SOLN
INTRAMUSCULAR | Status: DC | PRN
  Administered 2012-02-24: 15 mg via INTRATHECAL

## 2012-02-24 MED ORDER — DIPHENHYDRAMINE HCL 12.5 MG/5ML PO ELIX: 12.5000 mg | ORAL_SOLUTION | Freq: Four times a day (QID) | ORAL | Status: DC | PRN

## 2012-02-24 MED ORDER — NALOXONE HCL 0.4 MG/ML IJ SOLN: 0.4000 mg | INTRAMUSCULAR | Status: DC | PRN

## 2012-02-24 MED ORDER — ACETAMINOPHEN 10 MG/ML IV SOLN
INTRAVENOUS | Status: AC
  Filled 2012-02-24: qty 100

## 2012-02-24 MED ORDER — PROPOFOL 10 MG/ML IV EMUL
INTRAVENOUS | Status: DC | PRN
  Administered 2012-02-24: 75 ug/kg/min via INTRAVENOUS

## 2012-02-24 MED ORDER — OXYCODONE HCL 5 MG/5ML PO SOLN
5.0000 mg | Freq: Once | ORAL | Status: DC | PRN
  Filled 2012-02-24: qty 5

## 2012-02-24 MED ORDER — ACETAMINOPHEN 10 MG/ML IV SOLN
INTRAVENOUS | Status: DC | PRN
  Administered 2012-02-24: 1000 mg via INTRAVENOUS

## 2012-02-24 MED ORDER — BISACODYL 10 MG RE SUPP: 10.0000 mg | Freq: Every day | RECTAL | Status: DC | PRN

## 2012-02-24 MED ORDER — TRAMADOL HCL 50 MG PO TABS: 50.0000 mg | ORAL_TABLET | Freq: Four times a day (QID) | ORAL | Status: DC | PRN

## 2012-02-24 MED ORDER — ACETAMINOPHEN 10 MG/ML IV SOLN: 1000.0000 mg | Freq: Once | INTRAVENOUS | Status: DC

## 2012-02-24 MED ORDER — BUPIVACAINE 0.25 % ON-Q PUMP SINGLE CATH 300ML
INJECTION | Status: DC | PRN
  Administered 2012-02-24: 300 mL

## 2012-02-24 MED ORDER — BUPIVACAINE 0.25 % ON-Q PUMP SINGLE CATH 300ML
INJECTION | Status: AC
  Filled 2012-02-24: qty 300

## 2012-02-24 MED ORDER — DEXTROSE 5 % IV SOLN
3.0000 g | INTRAVENOUS | Status: AC
  Administered 2012-02-24: 3 g via INTRAVENOUS
  Filled 2012-02-24: qty 3000

## 2012-02-24 MED ORDER — OXYCODONE HCL 5 MG PO TABS: 5.0000 mg | ORAL_TABLET | Freq: Once | ORAL | Status: DC | PRN

## 2012-02-24 MED ORDER — CEFAZOLIN SODIUM 1-5 GM-% IV SOLN
1.0000 g | Freq: Four times a day (QID) | INTRAVENOUS | Status: AC
  Administered 2012-02-24 (×2): 1 g via INTRAVENOUS
  Filled 2012-02-24 (×2): qty 50

## 2012-02-24 MED ORDER — DIPHENHYDRAMINE HCL 50 MG/ML IJ SOLN: 12.5000 mg | Freq: Four times a day (QID) | INTRAMUSCULAR | Status: DC | PRN

## 2012-02-24 MED ORDER — 0.9 % SODIUM CHLORIDE (POUR BTL) OPTIME
TOPICAL | Status: DC | PRN
  Administered 2012-02-24: 1000 mL

## 2012-02-24 MED ORDER — FLUTICASONE PROPIONATE 50 MCG/ACT NA SUSP
2.0000 | Freq: Every day | NASAL | Status: DC | PRN
  Filled 2012-02-24: qty 16

## 2012-02-24 MED ORDER — MIDAZOLAM HCL 5 MG/5ML IJ SOLN
INTRAMUSCULAR | Status: DC | PRN
  Administered 2012-02-24: 2 mg via INTRAVENOUS

## 2012-02-24 MED ORDER — POLYETHYLENE GLYCOL 3350 17 G PO PACK: 17.0000 g | PACK | Freq: Every day | ORAL | Status: DC | PRN

## 2012-02-24 MED ORDER — ACETAMINOPHEN 10 MG/ML IV SOLN: 1000.0000 mg | Freq: Once | INTRAVENOUS | Status: DC | PRN

## 2012-02-24 MED ORDER — MORPHINE SULFATE (PF) 1 MG/ML IV SOLN
INTRAVENOUS | Status: DC
  Administered 2012-02-24: 9 mg via INTRAVENOUS
  Administered 2012-02-24: 09:00:00 via INTRAVENOUS
  Administered 2012-02-24: 13 mg via INTRAVENOUS
  Administered 2012-02-24: 3 mg via INTRAVENOUS
  Administered 2012-02-24: 20.73 mg via INTRAVENOUS
  Administered 2012-02-25: 7.83 mg via INTRAVENOUS
  Administered 2012-02-25: via INTRAVENOUS
  Filled 2012-02-24 (×2): qty 25

## 2012-02-24 MED ORDER — LACTATED RINGERS IV SOLN: INTRAVENOUS | Status: DC

## 2012-02-24 MED ORDER — LACTATED RINGERS IV SOLN
INTRAVENOUS | Status: DC | PRN
  Administered 2012-02-24 (×2): via INTRAVENOUS

## 2012-02-24 MED ORDER — NIACIN ER (ANTIHYPERLIPIDEMIC) 500 MG PO TBCR
500.0000 mg | EXTENDED_RELEASE_TABLET | Freq: Every day | ORAL | Status: DC
  Administered 2012-02-24 – 2012-02-26 (×3): 500 mg via ORAL
  Filled 2012-02-24 (×4): qty 1

## 2012-02-24 MED ORDER — DEXTROSE-NACL 5-0.9 % IV SOLN
INTRAVENOUS | Status: DC
  Administered 2012-02-24 – 2012-02-26 (×3): via INTRAVENOUS

## 2012-02-24 MED ORDER — METHOCARBAMOL 100 MG/ML IJ SOLN
500.0000 mg | Freq: Four times a day (QID) | INTRAVENOUS | Status: DC | PRN
  Administered 2012-02-24: 500 mg via INTRAVENOUS
  Filled 2012-02-24: qty 5

## 2012-02-24 MED ORDER — MENTHOL 3 MG MT LOZG
1.0000 | LOZENGE | OROMUCOSAL | Status: DC | PRN
  Filled 2012-02-24 (×4): qty 9

## 2012-02-24 MED ORDER — LORATADINE 10 MG PO TABS
10.0000 mg | ORAL_TABLET | Freq: Every day | ORAL | Status: DC
  Administered 2012-02-24 – 2012-02-27 (×4): 10 mg via ORAL
  Filled 2012-02-24 (×4): qty 1

## 2012-02-24 MED ORDER — PROPOFOL 10 MG/ML IV BOLUS
INTRAVENOUS | Status: DC | PRN
  Administered 2012-02-24 (×6): 20 mg via INTRAVENOUS

## 2012-02-24 MED ORDER — BUPIVACAINE ON-Q PAIN PUMP (FOR ORDER SET NO CHG)
INJECTION | Status: DC
  Filled 2012-02-24: qty 1

## 2012-02-24 MED ORDER — ACETAMINOPHEN 325 MG PO TABS
650.0000 mg | ORAL_TABLET | Freq: Four times a day (QID) | ORAL | Status: DC | PRN
  Administered 2012-02-26: 650 mg via ORAL
  Filled 2012-02-24: qty 2

## 2012-02-24 MED ORDER — PHENOL 1.4 % MT LIQD
1.0000 | OROMUCOSAL | Status: DC | PRN
  Filled 2012-02-24: qty 177

## 2012-02-24 MED ORDER — RIVAROXABAN 10 MG PO TABS
10.0000 mg | ORAL_TABLET | Freq: Every day | ORAL | Status: DC
  Filled 2012-02-24: qty 1

## 2012-02-24 MED ORDER — SODIUM CHLORIDE 0.9 % IV SOLN: INTRAVENOUS | Status: DC

## 2012-02-24 MED ORDER — CHLORHEXIDINE GLUCONATE 4 % EX LIQD
60.0000 mL | Freq: Once | CUTANEOUS | Status: DC
  Filled 2012-02-24: qty 60

## 2012-02-24 SURGICAL SUPPLY — 55 items
BAG SPEC THK2 15X12 ZIP CLS (MISCELLANEOUS) ×1
BAG ZIPLOCK 12X15 (MISCELLANEOUS) ×2 IMPLANT
BANDAGE ELASTIC 6 VELCRO ST LF (GAUZE/BANDAGES/DRESSINGS) ×2 IMPLANT
BANDAGE ESMARK 6X9 LF (GAUZE/BANDAGES/DRESSINGS) ×1 IMPLANT
BLADE SAG 18X100X1.27 (BLADE) ×2 IMPLANT
BLADE SAW SGTL 11.0X1.19X90.0M (BLADE) ×2 IMPLANT
BNDG CMPR 9X6 STRL LF SNTH (GAUZE/BANDAGES/DRESSINGS) ×1
BNDG ESMARK 6X9 LF (GAUZE/BANDAGES/DRESSINGS) ×2
BOWL SMART MIX CTS (DISPOSABLE) ×2 IMPLANT
CATH KIT ON-Q SILVERSOAK 5 (CATHETERS) ×1 IMPLANT
CATH KIT ON-Q SILVERSOAK 5IN (CATHETERS) ×2 IMPLANT
CEMENT HV SMART SET (Cement) ×3 IMPLANT
CLOTH BEACON ORANGE TIMEOUT ST (SAFETY) ×2 IMPLANT
CLSR STERI-STRIP ANTIMIC 1/2X4 (GAUZE/BANDAGES/DRESSINGS) ×1 IMPLANT
CUFF TOURN SGL QUICK 34 (TOURNIQUET CUFF) ×2
CUFF TRNQT CYL 34X4X40X1 (TOURNIQUET CUFF) ×1 IMPLANT
DRAPE EXTREMITY T 121X128X90 (DRAPE) ×2 IMPLANT
DRAPE POUCH INSTRU U-SHP 10X18 (DRAPES) ×2 IMPLANT
DRAPE U-SHAPE 47X51 STRL (DRAPES) ×2 IMPLANT
DRSG ADAPTIC 3X8 NADH LF (GAUZE/BANDAGES/DRESSINGS) ×2 IMPLANT
DRSG PAD ABDOMINAL 8X10 ST (GAUZE/BANDAGES/DRESSINGS) ×1 IMPLANT
DURAPREP 26ML APPLICATOR (WOUND CARE) ×2 IMPLANT
ELECT REM PT RETURN 9FT ADLT (ELECTROSURGICAL) ×2
ELECTRODE REM PT RTRN 9FT ADLT (ELECTROSURGICAL) ×1 IMPLANT
EVACUATOR 1/8 PVC DRAIN (DRAIN) ×2 IMPLANT
FACESHIELD LNG OPTICON STERILE (SAFETY) ×10 IMPLANT
GLOVE BIO SURGEON STRL SZ7.5 (GLOVE) ×1 IMPLANT
GLOVE BIO SURGEON STRL SZ8 (GLOVE) ×3 IMPLANT
GLOVE BIOGEL PI IND STRL 8 (GLOVE) ×2 IMPLANT
GLOVE BIOGEL PI INDICATOR 8 (GLOVE) ×2
GOWN STRL NON-REIN LRG LVL3 (GOWN DISPOSABLE) ×2 IMPLANT
GOWN STRL REIN XL XLG (GOWN DISPOSABLE) ×2 IMPLANT
HANDPIECE INTERPULSE COAX TIP (DISPOSABLE) ×2
IMMOBILIZER KNEE 20 (SOFTGOODS) ×2
IMMOBILIZER KNEE 20 THIGH 36 (SOFTGOODS) ×1 IMPLANT
KIT BASIN OR (CUSTOM PROCEDURE TRAY) ×2 IMPLANT
MANIFOLD NEPTUNE II (INSTRUMENTS) ×2 IMPLANT
NS IRRIG 1000ML POUR BTL (IV SOLUTION) ×2 IMPLANT
PACK TOTAL JOINT (CUSTOM PROCEDURE TRAY) ×2 IMPLANT
PAD ABD 7.5X8 STRL (GAUZE/BANDAGES/DRESSINGS) ×2 IMPLANT
PADDING CAST COTTON 6X4 STRL (CAST SUPPLIES) ×4 IMPLANT
POSITIONER SURGICAL ARM (MISCELLANEOUS) ×2 IMPLANT
SET HNDPC FAN SPRY TIP SCT (DISPOSABLE) ×1 IMPLANT
SPONGE GAUZE 4X4 12PLY (GAUZE/BANDAGES/DRESSINGS) ×2 IMPLANT
STRIP CLOSURE SKIN 1/2X4 (GAUZE/BANDAGES/DRESSINGS) ×4 IMPLANT
SUCTION FRAZIER 12FR DISP (SUCTIONS) ×2 IMPLANT
SUT MNCRL AB 4-0 PS2 18 (SUTURE) ×2 IMPLANT
SUT PDS AB 1 CT1 27 (SUTURE) ×3 IMPLANT
SUT VIC AB 2-0 CT1 27 (SUTURE) ×6
SUT VIC AB 2-0 CT1 TAPERPNT 27 (SUTURE) ×3 IMPLANT
SUT VLOC 180 0 24IN GS25 (SUTURE) ×2 IMPLANT
TOWEL OR 17X26 10 PK STRL BLUE (TOWEL DISPOSABLE) ×4 IMPLANT
TRAY FOLEY CATH 14FRSI W/METER (CATHETERS) ×2 IMPLANT
WATER STERILE IRR 1500ML POUR (IV SOLUTION) ×2 IMPLANT
WRAP KNEE MAXI GEL POST OP (GAUZE/BANDAGES/DRESSINGS) ×3 IMPLANT

## 2012-02-24 NOTE — Anesthesia Postprocedure Evaluation (Signed)
Anesthesia Post Note  Patient: Tanya [name]  Procedure(s) Performed: Procedure(s) (LRB): TOTAL KNEE ARTHROPLASTY (Right)  Anesthesia type: Spinal  Patient location: PACU  Post pain: Pain level controlled  Post assessment: Post-op Vital signs reviewed  Last Vitals: BP 110/62  Pulse 70  Temp 35.6 C (Oral)  Resp 15  SpO2 97%  Post vital signs: Reviewed  Level of consciousness: sedated  Complications: No apparent anesthesia complications

## 2012-02-24 NOTE — Plan of Care (Signed)
Problem: Consults Goal: Diagnosis- Total Joint Replacement Right total knee     

## 2012-02-24 NOTE — Interval H&P Note (Signed)
History and Physical Interval Note:  02/24/2012 6:43 AM  Tanya [name]  has presented today for surgery, with the diagnosis of Osteoarthritis of the Right Knee  The various methods of treatment have been discussed with the patient and family. After consideration of risks, benefits and other options for treatment, the patient has consented to  Procedure(s) (LRB): TOTAL KNEE ARTHROPLASTY (Right) as a surgical intervention .  The patient's history has been reviewed, patient examined, no change in status, stable for surgery.  I have reviewed the patient's chart and labs.  Questions were answered to the patient's satisfaction.     Loanne Drilling

## 2012-02-24 NOTE — Anesthesia Procedure Notes (Signed)
Spinal  Patient location during procedure: OR Start time: 02/24/2012 7:13 AM Staffing Anesthesiologist: Lewie Loron R Preanesthetic Checklist Completed: patient identified, site marked, surgical consent, pre-op evaluation, timeout performed, IV checked, risks and benefits discussed and monitors and equipment checked Spinal Block Patient position: sitting Prep: ChloraPrep Patient monitoring: heart rate Approach: midline Location: L3-4 Injection technique: single-shot Needle Needle type: Sprotte  Needle gauge: 22 G Needle length: 9 cm Needle insertion depth: 8 cm Assessment Sensory level: T10 Additional Notes Prepped and draped in normal sterile fashion. No paresthesias. Clear CSF return.

## 2012-02-24 NOTE — H&P (View-Only) (Signed)
Tanya [name]  DOB: 1945-12-09 Married / Language: English / Race: White / Female  Date of Admission:  02/24/2012  Chief Complaint:  Right knee Pain  History of Present Illness The patient is a 66 year old female who comes in for a preoperative History and Physical. The patient is scheduled for a right total knee arthroplasty to be performed by Dr. Gus Rankin. Aluisio, MD at [facility] on 02/24/2012. The patient is a 66 year old female who presents with knee complaints. The patient is seen today for a second opinion. The patient reports right knee symptoms including: pain, swelling and grinding which began 4 year(s) ago without any known injury. Note for "Knee pain": She had a knee scope in 2011. Dr. Eulah Pont told her she needed to have a total knee arthroplasty. She has been putting it off for years she says. She states the knee is hurting at all times. This is limiting what she can and can not do. The knee feels like it wants to give out at times. Dr. Eulah Pont scoped her in 2011 and told her she needed a total knee at that point. She has been trying to hold off. She is now at a stage she can not tolerate this anymore. She has pain day and night. The knee does give out. She is unable to do the things she desires. They have been treated conservatively in the past for the above stated problem and despite conservative measures, they continue to have progressive pain and severe functional limitations and dysfunction. They have failed non-operative management. It is felt that they would benefit from undergoing total joint replacement. Risks and benefits of the procedure have been discussed with the patient and they elect to proceed with surgery. There are no active contraindications to surgery such as ongoing infection or rapidly progressive neurological disease.   Problem List/Past Medical Osteoarthritis, Knee (715.96) Osteoporosis Skin Cancer Breast Cancer Cataract  Allergies No Known  Drug Allergies   Family History Cerebrovascular Accident. grandfather mothers side Heart Disease. grandfather mothers side Osteoarthritis. mother   Social History Drug/Alcohol Rehab (Previously). no Exercise. Exercises weekly; does running / walking Illicit drug use. no Children. 1 Current work status. working full time Drug/Alcohol Rehab (Currently). no Tobacco use. never smoker Living situation. live with spouse Marital status. married Pain Contract. no Alcohol use. current drinker; drinks wine; only occasionally per week   Medication History Simvastatin (20MG  Tablet, Oral) Active. Niaspan (500MG  Tablet ER, Oral) Active. Calcium Carbonate (1250MG  Tablet, Oral) Active. ZyrTEC Allergy (10MG  Tablet, Oral) Active. Aspirin EC (81MG  Tablet DR, Oral) Active.   Past Surgical History Hysterectomy. complete (non-cancerous) Mastectomy. bilateral Breast Reconstruction. bilateral Ankle Surgery. left Arthroscopy of Knee. bilateral  Review of Systems General:Not Present- Chills, Fever, Night Sweats, Fatigue, Weight Gain, Weight Loss and Memory Loss. Skin:Not Present- Hives, Itching, Rash, Eczema and Lesions. HEENT:Not Present- Tinnitus, Headache, Double Vision, Visual Loss, Hearing Loss and Dentures. Respiratory:Not Present- Shortness of breath with exertion, Shortness of breath at rest, Allergies, Coughing up blood and Chronic Cough. Cardiovascular:Not Present- Chest Pain, Racing/skipping heartbeats, Difficulty Breathing Lying Down, Murmur, Swelling and Palpitations. Gastrointestinal:Not Present- Bloody Stool, Heartburn, Abdominal Pain, Vomiting, Nausea, Constipation, Diarrhea, Difficulty Swallowing, Jaundice and Loss of appetitie. Female Genitourinary:Not Present- Blood in Urine, Urinary frequency, Weak urinary stream, Discharge, Flank Pain, Incontinence, Painful Urination, Urgency, Urinary Retention and Urinating at Night. Musculoskeletal:Present-  Muscle Weakness, Muscle Pain, Joint Swelling, Joint Pain and Morning Stiffness. Not Present- Back Pain and Spasms. Neurological:Not Present- Tremor, Dizziness,  Blackout spells, Paralysis, Difficulty with balance and Weakness. Psychiatric:Not Present- Insomnia.   Vitals Weight: 240 lb Height: 65 in Body Surface Area: 2.23 m Body Mass Index: 39.94 kg/m Pulse: 72 (Regular) Resp.: 12 (Unlabored) BP: 172/94 (Sitting, Right Arm, Standard)    Physical Exam The physical exam findings are as follows:  Patient is a 66 year old female with continued knee pain.   General Mental Status - Alert, cooperative and good historian. General Appearance- pleasant. Not in acute distress. Orientation- Oriented X3. Build & Nutrition- Well nourished and Well developed.   Head and Neck Head- normocephalic, atraumatic . Neck Global Assessment- supple. no bruit auscultated on the right and no bruit auscultated on the left.   Eye Pupil- Bilateral- Regular and Round. Motion- Bilateral- EOMI.   Chest and Lung Exam Auscultation: Breath sounds:- clear at anterior chest wall and - clear at posterior chest wall. Adventitious sounds:- No Adventitious sounds.   Cardiovascular Auscultation:Rhythm- Regular rate and rhythm. Heart Sounds- S1 WNL and S2 WNL. Murmurs & Other Heart Sounds:Auscultation of the heart reveals - No Murmurs.   Abdomen Inspection:Contour- Generalized moderate distention. Palpation/Percussion:Tenderness- Abdomen is non-tender to palpation. Rigidity (guarding)- Abdomen is soft. Auscultation:Auscultation of the abdomen reveals - Bowel sounds normal.   Female Genitourinary Not done, not pertinent to present illness  Musculoskeletal  She is alert and oriented. She is in no apparent distress. Evaluation of her hips show a normal range of motion, no discomfort. The left knee exam is unremarkable. The right knee range is from 10-120. There  is marked crepitus on range of motion of the right knee. She is tender medial greater than lateral. No instability. RADIOGRAPHS: AP both knees and lateral show tricompartmental degenerative changes in the right knee. The left knee has some arthritis, no where near as bad as the right.  Assessment & Plan Osteoarthritis Right Knee  Note: Patient is for a Right Total Knee Repalcement by Dr. Lequita Halt.  Plan is to go home.  She has some lymphedema in the left arm following surgery so need to avoid BP's ansd IV's in the left arm.  PCP - Dr. Marga Melnick - Patient has been seen preoperatively and felt to be stable for surgery.  Signed electronically by Roberts Gaudy, PA-C

## 2012-02-24 NOTE — Transfer of Care (Signed)
Immediate Anesthesia Transfer of Care Note  Patient: Tanya [name]  Procedure(s) Performed: Procedure(s) (LRB): TOTAL KNEE ARTHROPLASTY (Right)  Patient Location: PACU  Anesthesia Type: Regional  Level of Consciousness: sedated, patient cooperative and responds to stimulaton  Airway & Oxygen Therapy: Patient Spontanous Breathing and Patient connected to face mask oxgen  Post-op Assessment: Report given to PACU RN and Post -op Vital signs reviewed and stable  Post vital signs: Reviewed and stable  Complications: No apparent anesthesia complications T-10 level on exam in PACU

## 2012-02-24 NOTE — Progress Notes (Signed)
UR COMPLETED  

## 2012-02-24 NOTE — Op Note (Signed)
Pre-operative diagnosis- Osteoarthritis  Right knee(s)  Post-operative diagnosis- Osteoarthritis Right knee(s)  Procedure-  Right  Total Knee Arthroplasty  Surgeon- Gus Rankin. Trevontae Lindahl, MD  Assistant- Leilani Able, PA-C   Anesthesia-  Spinal EBL-* No blood loss amount entered *  Drains Hemovac  Tourniquet time-  Total Tourniquet Time Documented: Thigh (Right) - 37 minutes   Complications- None  Condition-PACU - hemodynamically stable.   Brief Clinical Note  Tanya [name] is a 66 y.o. year old female with end stage OA of her right knee with progressively worsening pain and dysfunction. She has constant pain, with activity and at rest and significant functional deficits with difficulties even with ADLs. She has had extensive non-op management including analgesics, injections of cortisone and viscosupplements, and home exercise program, but remains in significant pain with significant dysfunction.Radiographs show bone on bone arthritis lateral and patellofemoral. She presents now for right Total Knee Arthroplasty.    Procedure in detail---   The patient is brought into the operating room and positioned supine on the operating table. After successful administration of  Spinal,   a tourniquet is placed high on the  Right thigh(s) and the lower extremity is prepped and draped in the usual sterile fashion. Time out is performed by the operating team and then the  Right lower extremity is wrapped in Esmarch, knee flexed and the tourniquet inflated to 300 mmHg.       A midline incision is made with a ten blade through the subcutaneous tissue to the level of the extensor mechanism. A fresh blade is used to make a medial parapatellar arthrotomy. Soft tissue over the proximal medial tibia is subperiosteally elevated to the joint line with a knife and into the semimembranosus bursa with a Cobb elevator. Soft tissue over the proximal lateral tibia is elevated with attention being paid to avoiding the  patellar tendon on the tibial tubercle. The patella is everted, knee flexed 90 degrees and the ACL and PCL are removed. Findings are bone on bone all 3 compartments with large osteophytes.        The drill is used to create a starting hole in the distal femur and the canal is thoroughly irrigated with sterile saline to remove the fatty contents. The 5 degree Right  valgus alignment guide is placed into the femoral canal and the distal femoral cutting block is pinned to remove 10 mm off the distal femur. Resection is made with an oscillating saw.      The tibia is subluxed forward and the menisci are removed. The extramedullary alignment guide is placed referencing proximally at the medial aspect of the tibial tubercle and distally along the second metatarsal axis and tibial crest. The block is pinned to remove 2mm off the more deficient lateral  side. Resection is made with an oscillating saw. Size 3 is the most appropriate size for the tibia and the proximal tibia is prepared with the modular drill and keel punch for that size. I prepared for an MBT revision tray as she has morbid obesity and I desired better fixation in the tibia.      The femoral sizing guide is placed and size 4 narrow is most appropriate. Rotation is marked off the epicondylar axis and confirmed by creating a rectangular flexion gap at 90 degrees. The size 4 cutting block is pinned in this rotation and the anterior, posterior and chamfer cuts are made with the oscillating saw. The intercondylar block is then placed and that cut is made.  Trial size 3 tibial component, trial size 4 narrow posterior stabilized femur and a 10  mm posterior stabilized rotating platform insert trial is placed. Full extension is achieved with excellent varus/valgus and anterior/posterior balance throughout full range of motion. The patella is everted and thickness measured to be 24  mm. Free hand resection is taken to 14 mm, a 35 template is placed, lug holes  are drilled, trial patella is placed, and it tracks normally. Osteophytes are removed off the posterior femur with the trial in place. All trials are removed and the cut bone surfaces prepared with pulsatile lavage. Cement is mixed and once ready for implantation, the size 3 MBT revision tibial implant, size  4 narrow posterior stabilized femoral component, and the size 35 patella are cemented in place and the patella is held with the clamp. The trial insert is placed and the knee held in full extension. All extruded cement is removed and once the cement is hard the permanent 10 mm posterior stabilized rotating platform insert is placed into the tibial tray.      The wound is copiously irrigated with saline solution and the extensor mechanism closed over a hemovac drain with #1 PDS suture. The tourniquet is released for a total tourniquet time of 34  minutes. Flexion against gravity is 140 degrees and the patella tracks normally. Subcutaneous tissue is closed with 2.0 vicryl and subcuticular with running 4.0 Monocryl. The catheter for the Marcaine pain pump is placed and the pump is initiated. The incision is cleaned and dried and steri-strips and a bulky sterile dressing are applied. The limb is placed into a knee immobilizer and the patient is awakened and transported to recovery in stable condition.      Please note that a surgical assistant was a medical necessity for this procedure in order to perform it in a safe and expeditious manner. Surgical assistant was necessary to retract the ligaments and vital neurovascular structures to prevent injury to them and also necessary for proper positioning of the limb to allow for anatomic placement of the prosthesis.   Gus Rankin Jatia Musa, MD    02/24/2012, 8:25 AM

## 2012-02-25 LAB — BASIC METABOLIC PANEL
Calcium: 8.4 mg/dL (ref 8.4–10.5)
GFR calc Af Amer: >90 mL/min (ref >90)
GFR calc non Af Amer: >90 mL/min (ref >90)
Potassium: 4.2 mEq/L (ref 3.5–5.1)
Sodium: 136 mEq/L (ref 135–145)

## 2012-02-25 LAB — CBC
MCHC: 33.6 g/dL (ref 30.0–36.0)
RDW: 12.8 % (ref 11.5–15.5)

## 2012-02-25 MED ORDER — SODIUM CHLORIDE 0.9 % IV SOLN
Freq: Once | INTRAVENOUS | Status: AC
  Administered 2012-02-25: 22:00:00 via INTRAVENOUS

## 2012-02-25 MED ORDER — FUROSEMIDE 10 MG/ML IJ SOLN
10.0000 mg | Freq: Once | INTRAMUSCULAR | Status: AC
  Administered 2012-02-25: 10 mg via INTRAVENOUS
  Filled 2012-02-25: qty 1

## 2012-02-25 MED ORDER — MORPHINE SULFATE 2 MG/ML IJ SOLN: 1.0000 mg | INTRAMUSCULAR | Status: DC | PRN

## 2012-02-25 NOTE — Evaluation (Signed)
Occupational Therapy Evaluation Patient Details Name: Tanya [name] MRN: [id number] DOB: 1946-03-16 Today's Date: 02/25/2012 Time: 4696-2952 OT Time Calculation (min): 22 min  OT Assessment / Plan / Recommendation Clinical Impression  Pt presents with a RTKR. Skilled OT recommended to maximize independence with BADLs to supervision level in prep for safe d/c home with HHOT and prn A from family.    OT Assessment  Patient needs continued OT Services    Follow Up Recommendations  Home health OT    Barriers to Discharge      Equipment Recommendations  None recommended by OT    Recommendations for Other Services    Frequency  Min 2X/week    Precautions / Restrictions Precautions Precautions: Knee Required Braces or Orthoses: Knee Immobilizer - Right Knee Immobilizer - Right: Discontinue once straight leg raise with < 10 degree lag Restrictions Weight Bearing Restrictions: No RLE Weight Bearing: Weight bearing as tolerated   Pertinent Vitals/Pain Pt reported 7/10 pain at end of session, repositioned, cold applied and RN made aware.    ADL  Grooming: Simulated;Set up Where Assessed - Grooming: Unsupported sitting Upper Body Bathing: Simulated Where Assessed - Upper Body Bathing: Unsupported sitting Lower Body Bathing: Simulated;Minimal assistance Where Assessed - Lower Body Bathing: Supported sit to stand Upper Body Dressing: Simulated;Set up Where Assessed - Upper Body Dressing: Unsupported sitting Lower Body Dressing: Simulated;Moderate assistance Where Assessed - Lower Body Dressing: Supported sit to stand Toilet Transfer: Mining engineer Method: Surveyor, minerals: Other (comment) (back to bed) Toileting - Clothing Manipulation and Hygiene: Simulated;Minimal assistance Where Assessed - Engineer, mining and Hygiene: Standing Equipment Used: Rolling walker ADL Comments: Eval limited 2* pt feeling dizzy and  nauseous.    OT Diagnosis: Generalized weakness  OT Problem List: Decreased activity tolerance;Decreased safety awareness;Decreased knowledge of use of DME or AE;Pain OT Treatment Interventions: Self-care/ADL training;Therapeutic activities;DME and/or AE instruction;Patient/family education   OT Goals Acute Rehab OT Goals OT Goal Formulation: With patient Time For Goal Achievement: 03/10/12 Potential to Achieve Goals: Good ADL Goals Pt Will Perform Grooming: with supervision;Standing at sink ADL Goal: Grooming - Progress: Goal set today Pt Will Perform Lower Body Bathing: with supervision;Sit to stand from chair;Sit to stand from bed ADL Goal: Lower Body Bathing - Progress: Goal set today Pt Will Perform Lower Body Dressing: with supervision;Sit to stand from bed;Sit to stand from chair ADL Goal: Lower Body Dressing - Progress: Goal set today Pt Will Transfer to Toilet: with supervision;Comfort height toilet;3-in-1;Ambulation;Stand pivot transfer ADL Goal: Toilet Transfer - Progress: Goal set today Pt Will Perform Toileting - Clothing Manipulation: with supervision;Standing ADL Goal: Toileting - Clothing Manipulation - Progress: Goal set today Pt Will Perform Tub/Shower Transfer: with supervision;Ambulation;Transfer tub bench ADL Goal: Tub/Shower Transfer - Progress: Goal set today  Visit Information  Last OT Received On: 02/25/12 Assistance Needed: +1    Subjective Data  Subjective: I was dizzy earlier. Patient Stated Goal: Return home   Prior Functioning  Vision/Perception  Home Living Lives With: Spouse Available Help at Discharge: Family;Available 24 hours/day Type of Home: House Home Access: Stairs to enter [employer] of Steps: 1 Entrance Stairs-Rails: None Home Layout: Two level;Able to live on main level with bedroom/bathroom Bathroom Shower/Tub: Engineer, manufacturing systems: Standard Home Adaptive Equipment: Tub transfer bench;Bedside  commode/3-in-1;Walker - rolling Additional Comments: mother's equipment Prior Function Level of Independence: Independent Able to Take Stairs?: Yes Driving: Yes Vocation: Full time employment Communication Communication: No difficulties  Cognition  Overall Cognitive Status: Appears within functional limits for tasks assessed/performed Arousal/Alertness: Awake/alert Orientation Level: Appears intact for tasks assessed Behavior During Session: [facility] for tasks performed    Extremity/Trunk Assessment Right Upper Extremity Assessment RUE ROM/Strength/Tone: [facility] for tasks assessed Left Upper Extremity Assessment LUE ROM/Strength/Tone: [facility] for tasks assessed   Mobility Bed Mobility Bed Mobility: Sit to Supine Supine to Sit: HOB elevated;With rails;4: Min assist Sit to Supine: 3: Mod assist;HOB flat;With rail Details for Bed Mobility Assistance: Assist needed for BLEs. Min cues for technique and hand placement. Transfers Sit to Stand: 4: Min assist;With upper extremity assist;With armrests;From chair/3-in-1 Stand to Sit: 4: Min assist;With upper extremity assist;To bed Details for Transfer Assistance: min VCs for hand placement and RLE management.   Exercise    Balance    End of Session OT - End of Session Activity Tolerance: Patient limited by fatigue Patient left: in bed;with call bell/phone within reach  GO     Sinclair Arrazola A OTR/L [phone] 02/25/2012, 12:17 PM

## 2012-02-25 NOTE — Progress Notes (Signed)
   Subjective: 1 Day Post-Op Procedure(s) (LRB): TOTAL KNEE ARTHROPLASTY (Right) Patient reports pain as moderate.  Tough night last night.  Little better today. Patient seen in rounds with Dr. Lequita Halt. Patient is well, but has had some minor complaints of pain in the knee, requiring pain medications We will start therapy today.  Plan is to go Home after hospital stay.  Objective: Vital signs in last 24 hours: Temp:  [96.1 F (35.6 C)-98.2 F (36.8 C)] 98.1 F (36.7 C) (08/13 0545) Pulse Rate:  [70-94] 94  (08/13 0545) Resp:  [12-18] 16  (08/13 0545) BP: (104-137)/(60-88) 108/68 mmHg (08/13 0545) SpO2:  [92 %-100 %] 94 % (08/13 0545) Weight:  [111.585 kg (246 lb)] 111.585 kg (246 lb) (08/12 1031)  Intake/Output from previous day:  Intake/Output Summary (Last 24 hours) at 02/25/12 0981 Last data filed at 02/25/12 1914  Gross per 24 hour  Intake 3772.58 ml  Output   1825 ml  Net 1947.58 ml    Intake/Output this shift: Total I/O In: -  Out: 90 [Urine:90]  Labs:  Basename 02/25/12 0600  HGB 12.3    Basename 02/25/12 0600  WBC 11.7*  RBC 4.00  HCT 36.6  PLT 253    Basename 02/25/12 0600  NA 136  K 4.2  CL 101  CO2 27  BUN 11  CREATININE 0.63  GLUCOSE 121*  CALCIUM 8.4   No results found for this basename: LABPT:2,INR:2 in the last 72 hours  EXAM General - Patient is Alert, Appropriate and Oriented Extremity - Neurovascular intact Sensation intact distally Dorsiflexion/Plantar flexion intact Dressing - dressing C/D/I Motor Function - intact, moving foot and toes well on exam.  Hemovac pulled without difficulty.  Past Medical History  Diagnosis Date  . Lymphedema 2000    LUE  . Hyperlipidemia   . perennial allergies   . PONV (postoperative nausea and vomiting)   . 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 Arthritis     Assessment/Plan: 1 Day Post-Op Procedure(s) (LRB): TOTAL KNEE ARTHROPLASTY (Right) Principal Problem:  *OA (osteoarthritis) of  knee   Advance diet Up with therapy Discharge home with home health  DVT Prophylaxis - Xarelto, ASA 81 mg on hold for now Weight-Bearing as tolerated to right leg No vaccines. D/C PCA Morphine, Change to IV push D/C O2 and Pulse OX and try on Room Air  PERKINS, ALEXZANDREW 02/25/2012, 8:22 AM

## 2012-02-25 NOTE — Evaluation (Signed)
Physical Therapy Evaluation Patient Details Name: Tanya [name] MRN: [id number] DOB: [birth year] Today's Date: 02/25/2012 Time: 8413-2440 PT Time Calculation (min): 26 min  PT Assessment / Plan / Recommendation Clinical Impression  66 yo female s/p R TKA. Mobilizing fairly well. Some lightheadedness/nausea with mobility. Anticipate pt will progress well during stay.     PT Assessment  Patient needs continued PT services    Follow Up Recommendations  Home health PT    Barriers to Discharge        Equipment Recommendations  None recommended by PT (pt has access to mother's equipment-will bring in RW)    Recommendations for Other Services OT consult   Frequency 7X/week    Precautions / Restrictions Precautions Precautions: Knee Required Braces or Orthoses: Knee Immobilizer - Right Restrictions Weight Bearing Restrictions: No RLE Weight Bearing: Weight bearing as tolerated   Pertinent Vitals/Pain       Mobility  Bed Mobility Bed Mobility: Supine to Sit Supine to Sit: HOB elevated;With rails;4: Min assist Details for Bed Mobility Assistance: VCs safety, technique, hand placement. Assist for R LE off bed.  Transfers Transfers: Sit to Stand;Stand to Sit Sit to Stand: 4: Min assist;With upper extremity assist;From bed Stand to Sit: 4: Min assist;With upper extremity assist;To chair/3-in-1;With armrests Details for Transfer Assistance: VCs safety, technique, hand placement. Assist to rise, stabilize, control descent.  Ambulation/Gait Ambulation/Gait Assistance: 4: Min assist Ambulation Distance (Feet): 20 Feet Assistive device: Rolling walker Ambulation/Gait Assistance Details: VCs safety, technique, sequence. Slow gait speed. Some lightheadedness during ambulation. Followed with recliner.  Gait Pattern: Step-to pattern;Antalgic;Decreased stride length;Decreased step length - right;Decreased step length - left    Exercises     PT Diagnosis: Difficulty walking;Abnormality  of gait;Acute pain  PT Problem List: Decreased strength;Decreased range of motion;Decreased activity tolerance;Decreased mobility;Pain;Decreased knowledge of use of DME PT Treatment Interventions: DME instruction;Gait training;Functional mobility training;Therapeutic activities;Therapeutic exercise;Patient/family education;Stair training   PT Goals Acute Rehab PT Goals PT Goal Formulation: With patient Time For Goal Achievement: 03/03/12 Potential to Achieve Goals: Good Pt will go Supine/Side to Sit: with supervision PT Goal: Supine/Side to Sit - Progress: Goal set today Pt will go Sit to Supine/Side: with supervision PT Goal: Sit to Supine/Side - Progress: Goal set today Pt will go Sit to Stand: with supervision PT Goal: Sit to Stand - Progress: Goal set today Pt will Ambulate: 51 - 150 feet;with supervision;with least restrictive assistive device PT Goal: Ambulate - Progress: Goal set today Pt will Go Up / Down Stairs: 1-2 stairs;with least restrictive assistive device;with supervision (1 step) PT Goal: Up/Down Stairs - Progress: Goal set today  Visit Information  Last PT Received On: 02/25/12 Assistance Needed: +1    Subjective Data  Subjective: "It wasn't that bad" Patient Stated Goal: Home   Prior Functioning  Home Living Lives With: Spouse Available Help at Discharge: Family (husband, daughter) Type of Home: House Home Access: Stairs to enter Secretary/administrator of Steps: 1 Entrance Stairs-Rails: None Home Layout: Two level;Able to live on main level with bedroom/bathroom Bathroom Shower/Tub: Engineer, manufacturing systems: Standard Home Adaptive Equipment: Walker - rolling;Tub transfer bench;Bedside commode/3-in-1 Additional Comments: mother's equipment Prior Function Level of Independence: Independent Able to Take Stairs?: Yes Driving: Yes Communication Communication: No difficulties    Cognition  Overall Cognitive Status: Appears within functional limits  for tasks assessed/performed Arousal/Alertness: Awake/alert Orientation Level: Appears intact for tasks assessed Behavior During Session: [facility] for tasks performed    Extremity/Trunk Assessment Right Lower Extremity Assessment RLE  ROM/Strength/Tone: Deficits RLE ROM/Strength/Tone Deficits: moves ankle well. SLR 2/5. poor quad set Left Lower Extremity Assessment LLE ROM/Strength/Tone: WFL for tasks assessed Trunk Assessment Trunk Assessment: Normal   Balance    End of Session PT - End of Session Equipment Utilized During Treatment: Gait belt;Right knee immobilizer Activity Tolerance: Patient tolerated treatment well Patient left: in chair;with call bell/phone within reach  GP     Rebeca Alert [facility] 02/25/2012, 11:01 AM [phone]

## 2012-02-25 NOTE — Progress Notes (Signed)
Physical Therapy Treatment Patient Details Name: Tanya [name] MRN: [id number] DOB: February 16, 1946 Today's Date: 02/25/2012 Time: 4098-1191 PT Time Calculation (min): 25 min  PT Assessment / Plan / Recommendation Comments on Treatment Session  Progressing slowly.     Follow Up Recommendations  Home health PT    Barriers to Discharge        Equipment Recommendations  None recommended by PT    Recommendations for Other Services OT consult  Frequency 7X/week   Plan Discharge plan remains appropriate    Precautions / Restrictions Precautions Precautions: Knee Required Braces or Orthoses: Knee Immobilizer - Right Knee Immobilizer - Right: Discontinue once straight leg raise with < 10 degree lag Restrictions Weight Bearing Restrictions: No RLE Weight Bearing: Weight bearing as tolerated   Pertinent Vitals/Pain     Mobility  Bed Mobility Bed Mobility: Supine to Sit;Sit to Supine Supine to Sit: 4: Min assist Sit to Supine: 4: Min assist Details for Bed Mobility Assistance: Assist for R LE onto/off bed and trunk. Transfers Transfers: Sit to Stand;Stand to Sit Sit to Stand: 4: Min assist;From bed;With upper extremity assist Stand to Sit: 4: Min assist;To bed;With upper extremity assist Details for Transfer Assistance: VCs safety, technique, hand placement. Assist to rise, stabilize, control descent.  Ambulation/Gait Ambulation/Gait Assistance: 4: Min assist Ambulation Distance (Feet): 45 Feet Assistive device: Rolling walker Ambulation/Gait Assistance Details: VCS safety, technique, sequence. Slow gait speed. Gait Pattern: Antalgic;Decreased stride length;Decreased step length - right;Decreased step length - left    Exercises Total Joint Exercises Ankle Circles/Pumps: AROM;Both;10 reps;Supine Quad Sets: AROM;Both;10 reps;Supine Heel Slides: AAROM;Strengthening;Right;10 reps;Supine Hip ABduction/ADduction: AAROM;Strengthening;Right;10 reps;Supine Straight Leg Raises:  AAROM;Strengthening;Right;10 reps;Supine   PT Diagnosis:    PT Problem List:   PT Treatment Interventions:     PT Goals Acute Rehab PT Goals Pt will go Supine/Side to Sit: with supervision PT Goal: Supine/Side to Sit - Progress: Progressing toward goal Pt will go Sit to Supine/Side: with supervision PT Goal: Sit to Supine/Side - Progress: Progressing toward goal Pt will go Sit to Stand: with supervision PT Goal: Sit to Stand - Progress: Progressing toward goal Pt will Ambulate: 51 - 150 feet;with supervision;with least restrictive assistive device PT Goal: Ambulate - Progress: Progressing toward goal  Visit Information  Last PT Received On: 02/25/12 Assistance Needed: +2 (safety)    Subjective Data  Subjective: "Is he still gonna put me in the machine?" Patient Stated Goal: Home   Cognition  Overall Cognitive Status: Appears within functional limits for tasks assessed/performed Arousal/Alertness: Awake/alert Orientation Level: Appears intact for tasks assessed Behavior During Session: [facility] for tasks performed    Balance     End of Session PT - End of Session Equipment Utilized During Treatment: Gait belt;Right knee immobilizer Activity Tolerance: Patient tolerated treatment well Patient left: in bed;with call bell/phone within reach   GP     Rebeca Alert [facility] 02/25/2012, 4:03 PM [phone]

## 2012-02-25 NOTE — Progress Notes (Signed)
Patient has not voided since catheter removal.  IVF running at 30 cc/hr and patient reports that because of nausea she did drink as much fluid as normal.  Advised patient to increase PO intake.  Received order from Dr. Shelle Iron to give patient a 250cc NS bolus and increase IVF to 50 cc/hr.  Will continue to monitor.

## 2012-02-26 ENCOUNTER — Encounter (HOSPITAL_COMMUNITY): Payer: Self-pay | Admitting: Orthopedic Surgery

## 2012-02-26 ENCOUNTER — Inpatient Hospital Stay (HOSPITAL_COMMUNITY): Payer: Medicare Other

## 2012-02-26 DIAGNOSIS — D500 Iron deficiency anemia secondary to blood loss (chronic): Secondary | ICD-10-CM | POA: Diagnosis not present

## 2012-02-26 LAB — BASIC METABOLIC PANEL
CO2: 29 mEq/L (ref 19–32)
GFR calc non Af Amer: 70 mL/min — ABNORMAL LOW (ref >90)
Glucose, Bld: 109 mg/dL — ABNORMAL HIGH (ref 70–99)
Potassium: 4.1 mEq/L (ref 3.5–5.1)
Sodium: 136 mEq/L (ref 135–145)

## 2012-02-26 LAB — CBC
Hemoglobin: 11.4 g/dL — ABNORMAL LOW (ref 12.0–15.0)
MCHC: 34 g/dL (ref 30.0–36.0)
Platelets: 239 10*3/uL (ref 150–400)
RBC: 3.67 MIL/uL — ABNORMAL LOW (ref 3.87–5.11)

## 2012-02-26 NOTE — Progress Notes (Signed)
Patient had episodes of O2 saturation declining the last 24 hours.  Day shift RN had obtained order to have an ABG performed.  Before it was performed Dr. Lequita Halt visited the patient at the bedside and canceled it.  Patient was placed on continuous pulse oximetry.  IS, coughing, and deep breathing encouraged.  Will continue to monitor.  Alphonsa Overall PA that ordered the ABG is aware that it was canceled and that patient is being monitored tonight.

## 2012-02-26 NOTE — Progress Notes (Signed)
Physical Therapy Treatment Patient Details Name: Tanya [name] MRN: [id number] DOB: 1945/12/18 Today's Date: 02/26/2012 Time: 7829-5621 PT Time Calculation (min): 31 min  PT Assessment / Plan / Recommendation Comments on Treatment Session  Continuing to progress well. HHPT.    Follow Up Recommendations  Home health PT    Barriers to Discharge        Equipment Recommendations  None recommended by PT    Recommendations for Other Services OT consult  Frequency 7X/week   Plan Discharge plan remains appropriate    Precautions / Restrictions Precautions Precautions: Knee Required Braces or Orthoses: Knee Immobilizer - Right Knee Immobilizer - Right: Discontinue once straight leg raise with < 10 degree lag Restrictions Weight Bearing Restrictions: No RLE Weight Bearing: Weight bearing as tolerated   Pertinent Vitals/Pain     Mobility  Bed Mobility Bed Mobility: Supine to Sit;Sit to Supine Supine to Sit: 4: Min assist Sit to Supine: 4: Min assist Details for Bed Mobility Assistance: Assist for R LE Transfers Transfers: Sit to Stand;Stand to Sit Sit to Stand: 4: Min guard;With upper extremity assist;From bed Stand to Sit: 4: Min guard;To bed;With upper extremity assist Details for Transfer Assistance: VCs safety, hand placement.  Ambulation/Gait Ambulation/Gait Assistance: 4: Min guard Ambulation Distance (Feet): 130 Feet Assistive device: Rolling walker Ambulation/Gait Assistance Details: VCs safety, posture. Slow gait speed.  Gait Pattern: Antalgic;Decreased stride length;Trunk flexed;Step-to pattern    Exercises    PT Diagnosis:    PT Problem List:   PT Treatment Interventions:     PT Goals Acute Rehab PT Goals Pt will go Supine/Side to Sit: with supervision PT Goal: Supine/Side to Sit - Progress: Progressing toward goal Pt will go Sit to Supine/Side: with supervision PT Goal: Sit to Supine/Side - Progress: Progressing toward goal Pt will go Sit to Stand: with  supervision PT Goal: Sit to Stand - Progress: Progressing toward goal Pt will Ambulate: 51 - 150 feet;with supervision;with least restrictive assistive device PT Goal: Ambulate - Progress: Progressing toward goal  Visit Information  Last PT Received On: 02/26/12 Assistance Needed: +1    Subjective Data  Subjective: "I have to be able to do this myself" Patient Stated Goal: Home   Cognition  Overall Cognitive Status: Appears within functional limits for tasks assessed/performed Arousal/Alertness: Awake/alert Orientation Level: Appears intact for tasks assessed Behavior During Session: [facility] for tasks performed    Balance     End of Session PT - End of Session Equipment Utilized During Treatment: Gait belt;Right knee immobilizer Activity Tolerance: Patient tolerated treatment well Patient left: with call bell/phone within reach;in bed   GP     Rebeca Alert [facility] 02/26/2012, 2:36 PM [phone]

## 2012-02-26 NOTE — Progress Notes (Signed)
Physical Therapy Treatment Patient Details Name: Tanya [name] MRN: [id number] DOB: 1946/05/20 Today's Date: 02/26/2012 Time: 4098-1191 PT Time Calculation (min): 26 min  PT Assessment / Plan / Recommendation Comments on Treatment Session  Pt looks much better today. Good improvement with mobility. Recommend HHPT.     Follow Up Recommendations  Home health PT    Barriers to Discharge        Equipment Recommendations  None recommended by PT    Recommendations for Other Services OT consult  Frequency 7X/week   Plan Discharge plan remains appropriate    Precautions / Restrictions Precautions Precautions: Knee Knee Immobilizer - Right: Discontinue once straight leg raise with < 10 degree lag Restrictions Weight Bearing Restrictions: No RLE Weight Bearing: Weight bearing as tolerated   Pertinent Vitals/Pain     Mobility  Bed Mobility Bed Mobility: Supine to Sit;Sit to Supine Supine to Sit: 4: Min assist;HOB elevated;With rails Sit to Supine: 4: Min assist;HOB flat;With rail Details for Bed Mobility Assistance: Assist for R LE off/onto bed.  Transfers Transfers: Sit to Stand;Stand to Sit Sit to Stand: 4: Min guard;With upper extremity assist;From bed Stand to Sit: 4: Min guard;With upper extremity assist;To bed Details for Transfer Assistance: VCs safety, technique, hand placement. Assist to rise, stabilize, control descent.  Ambulation/Gait Ambulation/Gait Assistance: 4: Min guard Ambulation Distance (Feet): 120 Feet Assistive device: Rolling walker Ambulation/Gait Assistance Details: VCs safety, technique, sequence. Slow gait speed. Good progression with distance this session.  Gait Pattern: Antalgic;Decreased stride length;Decreased step length - right;Decreased step length - left;Step-to pattern;Trunk flexed    Exercises Total Joint Exercises Ankle Circles/Pumps: AROM;Both;10 reps;Supine Quad Sets: AROM;Both;10 reps;Supine Heel Slides: AAROM;Strengthening;Right;10  reps;Supine Hip ABduction/ADduction: AAROM;Strengthening;Right;10 reps;Supine Straight Leg Raises: AAROM;Strengthening;Right;10 reps;Supine   PT Diagnosis:    PT Problem List:   PT Treatment Interventions:     PT Goals Acute Rehab PT Goals Pt will go Supine/Side to Sit: with supervision PT Goal: Supine/Side to Sit - Progress: Progressing toward goal Pt will go Sit to Supine/Side: with supervision PT Goal: Sit to Supine/Side - Progress: Progressing toward goal Pt will go Sit to Stand: with supervision PT Goal: Sit to Stand - Progress: Progressing toward goal Pt will Ambulate: 51 - 150 feet;with supervision;with least restrictive assistive device PT Goal: Ambulate - Progress: Progressing toward goal  Visit Information  Last PT Received On: 02/26/12 Assistance Needed: +1    Subjective Data  Subjective: "I feel much better today" Patient Stated Goal: Home tomorrow   Cognition  Overall Cognitive Status: Appears within functional limits for tasks assessed/performed Arousal/Alertness: Awake/alert Orientation Level: Appears intact for tasks assessed Behavior During Session: [facility] for tasks performed    Balance     End of Session PT - End of Session Equipment Utilized During Treatment: Gait belt;Right knee immobilizer Activity Tolerance: Patient tolerated treatment well Patient left: in bed;with call bell/phone within reach CPM Right Knee CPM Right Knee: Off   GP     Rebeca Alert [facility] 02/26/2012, 11:22 AM [phone]

## 2012-02-26 NOTE — Progress Notes (Addendum)
Occupational Therapy Treatment Patient Details Name: ANALUISA TUDOR MRN: [id number] DOB: 23-Feb-1946 Today's Date: 02/26/2012 Time: 4098-1191 OT Time Calculation (min): 16 min  OT Assessment / Plan / Recommendation Comments on Treatment Session Pt tolerated well today.    Follow Up Recommendations  Home health OT    Barriers to Discharge       Equipment Recommendations  None recommended by OT    Recommendations for Other Services    Frequency Min 2X/week   Plan Discharge plan remains appropriate    Precautions / Restrictions Precautions Precautions: Knee Required Braces or Orthoses: Knee Immobilizer - Right Knee Immobilizer - Right: Discontinue once straight leg raise with < 10 degree lag Restrictions Weight Bearing Restrictions: No RLE Weight Bearing: Weight bearing as tolerated   Pertinent Vitals/Pain Reported 7/10 pain. RN made aware.    ADL  Lower Body Dressing: Performed;Minimal assistance Where Assessed - Lower Body Dressing: Supported sit to stand ADL Comments: Pt stated she has been having no difficulty transferring on/off BSC. Pt was able to don socks, underwear with setup. Min A needed for R sneaker. Recommended use of shoe horn and reacher. Showed pt how to utilize.    OT Diagnosis:    OT Problem List:   OT Treatment Interventions:     OT Goals ADL Goals ADL Goal: Lower Body Dressing - Progress: Progressing toward goals  Visit Information  Last OT Received On: 02/26/12 Assistance Needed: +1    Subjective Data  Subjective: I was so sick yesterday. I barely remember you.   Prior Functioning       Cognition  Overall Cognitive Status: Appears within functional limits for tasks assessed/performed Arousal/Alertness: Awake/alert Orientation Level: Appears intact for tasks assessed Behavior During Session: [facility] for tasks performed    Mobility Bed Mobility Bed Mobility: Supine to Sit;Sit to Supine Supine to Sit: 4: Min assist;HOB elevated;With rails Sit  to Supine: 4: Min assist;With rail;HOB elevated Details for Bed Mobility Assistance: Educated pt how to use sheet as a leg lifter. Transfers Sit to Stand: 4: Min guard;With upper extremity assist;From bed Stand to Sit: 4: Min guard;With upper extremity assist;To bed Details for Transfer Assistance: Min VCs for hand placement and  R LE management.   Exercises   Balance    End of Session OT - End of Session Activity Tolerance: Patient tolerated treatment well Patient left: in bed;with call bell/phone within reach  GO     Garion Wempe A OTR/L [phone] 02/26/2012, 12:14 PM

## 2012-02-26 NOTE — Progress Notes (Signed)
   Subjective: 2 Days Post-Op Procedure(s) (LRB): TOTAL KNEE ARTHROPLASTY (Right) Patient reports pain as mild and moderate.   Patient seen in rounds with Dr. Lequita Halt.  Patient's daughter Sue Lush in the room with her. Patient is well, but has had some minor complaints of pain in the knee, requiring pain medications and urinary retention yesterday. Plan is to go Home after hospital stay.  Last night staff noted: Patient has not voided since catheter removal. IVF running at 30 cc/hr and patient reports that because of nausea she did drink as much fluid as normal. Advised patient to increase PO intake. Received order from Dr. Shelle Iron to give patient a 250cc NS bolus and increase IVF to 50 cc/hr. Will continue to monitor.  Patient voiding better today.  Had low UOP yesterday but has picked up and OUTPUT looks better this morning.  Will CXR to R/O any fluid buildup or effusions/edema.  Objective: Vital signs in last 24 hours: Temp:  [98 F (36.7 C)-98.7 F (37.1 C)] 98.3 F (36.8 C) (08/14 0520) Pulse Rate:  [80-116] 102  (08/14 0520) Resp:  [14-16] 16  (08/14 0520) BP: (112-140)/(70-85) 135/85 mmHg (08/14 0520) SpO2:  [91 %-96 %] 94 % (08/14 0520)  Intake/Output from previous day:  Intake/Output Summary (Last 24 hours) at 02/26/12 0906 Last data filed at 02/26/12 0820  Gross per 24 hour  Intake 1835.17 ml  Output   3350 ml  Net -1514.83 ml    Intake/Output this shift: Total I/O In: 240 [P.O.:240] Out: 800 [Urine:800]  Labs:  [facility] 02/26/12 0551 02/25/12 0600  HGB 11.4* 12.3    Basename 02/26/12 0551 02/25/12 0600  WBC 11.8* 11.7*  RBC 3.67* 4.00  HCT 33.5* 36.6  PLT 239 253    Basename 02/26/12 0551 02/25/12 0600  NA 136 136  K 4.1 4.2  CL 101 101  CO2 29 27  BUN 9 11  CREATININE 0.84 0.63  GLUCOSE 109* 121*  CALCIUM 8.4 8.4   No results found for this basename: LABPT:2,INR:2 in the last 72 hours  EXAM General - Patient is Alert, Appropriate and  Oriented Extremity - Neurovascular intact Sensation intact distally Dorsiflexion/Plantar flexion intact No cellulitis present Dressing/Incision - clean, dry, no drainage, healing Motor Function - intact, moving foot and toes well on exam.   Past Medical History  Diagnosis Date  . Lymphedema 2000    LUE  . Hyperlipidemia   . perennial allergies   . PONV (postoperative nausea and vomiting)   . 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 Arthritis     Assessment/Plan: 2 Days Post-Op Procedure(s) (LRB): TOTAL KNEE ARTHROPLASTY (Right) Principal Problem:  *OA (osteoarthritis) of knee Active Problems:  Expected Blood loss anemia   Advance diet Up with therapy D/C IV fluids Plan for discharge tomorrow Discharge home with home health  CXR this morning. DVT Prophylaxis - Xarelto, ASA 81 mg on hold for now Weight-Bearing as tolerated to right leg  Lemuel Boodram 02/26/2012, 9:06 AM

## 2012-02-27 LAB — CBC
Hemoglobin: 11 g/dL — ABNORMAL LOW (ref 12.0–15.0)
MCH: 30.4 pg (ref 26.0–34.0)
Platelets: 241 10*3/uL (ref 150–400)
RBC: 3.62 MIL/uL — ABNORMAL LOW (ref 3.87–5.11)
WBC: 11.5 10*3/uL — ABNORMAL HIGH (ref 4.0–10.5)

## 2012-02-27 MED ORDER — OXYCODONE HCL 5 MG PO TABS: 5.0000 mg | ORAL_TABLET | ORAL | Status: AC | PRN

## 2012-02-27 MED ORDER — METHOCARBAMOL 500 MG PO TABS: 500.0000 mg | ORAL_TABLET | Freq: Four times a day (QID) | ORAL | Status: AC | PRN

## 2012-02-27 MED ORDER — RIVAROXABAN 10 MG PO TABS: 10.0000 mg | ORAL_TABLET | Freq: Every day | ORAL | Status: DC

## 2012-02-27 NOTE — Progress Notes (Signed)
Physical Therapy Treatment Patient Details Name: Tanya [name] MRN: [id number] DOB: Oct 02, 1945 Today's Date: 02/27/2012 Time: 4098-1191 PT Time Calculation (min): 17 min  PT Assessment / Plan / Recommendation Comments on Treatment Session  Pt to d/c home today. verbally reviewed stair negotiation sequence/technique-pt declined to practice. Discussed car transfer. No further questions/concerns from pt. recommend hhpt.     Follow Up Recommendations  Home health PT    Barriers to Discharge        Equipment Recommendations  None recommended by PT    Recommendations for Other Services    Frequency 7X/week   Plan Discharge plan remains appropriate    Precautions / Restrictions Precautions Precautions: Knee Required Braces or Orthoses: Knee Immobilizer - Right Knee Immobilizer - Right: Discontinue once straight leg raise with < 10 degree lag Restrictions Weight Bearing Restrictions: No RLE Weight Bearing: Weight bearing as tolerated   Pertinent Vitals/Pain     Mobility  Bed Mobility Bed Mobility: Not assessed Transfers Transfers: Sit to Stand;Stand to Sit Sit to Stand: 4: Min guard;From chair/3-in-1;With armrests Stand to Sit: 4: Min guard;To chair/3-in-1;With armrests Details for Transfer Assistance: VCs safety, hand placement.  Ambulation/Gait Ambulation/Gait Assistance: 4: Min guard Ambulation Distance (Feet): 135 Feet Assistive device: Rolling walker Ambulation/Gait Assistance Details: VCs safety.  Gait Pattern: Antalgic;Decreased stride length;Step-to pattern Stairs: No (Pt declined to practice 1 step. Verbally reviewed sequence/technique)    Exercises Total Joint Exercises Ankle Circles/Pumps: AROM;Both;10 reps Quad Sets: AROM;Strengthening;Both;10 reps Short Arc Quad: AROM;Strengthening;Right;10 reps;Supine Heel Slides:  (Pt stated she already performed knee flexion sitting edge of chair) Hip ABduction/ADduction: AAROM;Strengthening;Right;10 reps Straight Leg  Raises: AAROM;Strengthening;Right;10 reps   PT Diagnosis:    PT Problem List:   PT Treatment Interventions:     PT Goals Acute Rehab PT Goals Pt will go Sit to Stand: with supervision PT Goal: Sit to Stand - Progress: Progressing toward goal Pt will Ambulate: 51 - 150 feet;with supervision;with least restrictive assistive device PT Goal: Ambulate - Progress: Progressing toward goal  Visit Information  Last PT Received On: 02/27/12 Assistance Needed: +1    Subjective Data  Subjective: "Pain medicine is wonderful" Patient Stated Goal: Home   Cognition  Overall Cognitive Status: Appears within functional limits for tasks assessed/performed Arousal/Alertness: Awake/alert Orientation Level: Appears intact for tasks assessed Behavior During Session: [facility] for tasks performed    Balance     End of Session PT - End of Session Equipment Utilized During Treatment: Gait belt Activity Tolerance: Patient tolerated treatment well Patient left: in chair;with call bell/phone within reach CPM Right Knee CPM Right Knee: Off   GP     Rebeca Alert [facility] 02/27/2012, 11:03 AM [phone]

## 2012-02-27 NOTE — Discharge Summary (Signed)
Physician Discharge Summary   Patient ID: Tanya [name] MRN: [id number] DOB/AGE: Apr 24, 1946 66 y.o.  Admit date: 02/24/2012 Discharge date: 03/09/2012  Primary Diagnosis: Osteoarthritis Right knee   Admission Diagnoses:  Past Medical History  Diagnosis Date  . Lymphedema 2000    LUE  . Hyperlipidemia   . perennial allergies   . PONV (postoperative nausea and vomiting)   . 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 Arthritis    Discharge Diagnoses:   Principal Problem:  *OA (osteoarthritis) of knee Active Problems:  Expected Blood loss anemia  Postop Atelectasis  Procedure:  Procedure(s) (LRB): TOTAL KNEE ARTHROPLASTY (Right)   Consults: None  HPI: Tanya [name] is a 66 y.o. year old female with end stage OA of her right knee with progressively worsening pain and dysfunction. She has constant pain, with activity and at rest and significant functional deficits with difficulties even with ADLs. She has had extensive non-op management including analgesics, injections of cortisone and viscosupplements, and home exercise program, but remains in significant pain with significant dysfunction.Radiographs show bone on bone arthritis lateral and patellofemoral. She presents now for right Total Knee Arthroplasty.   Laboratory Data: Hospital Outpatient Visit on 02/18/2012  Component Date Value Range Status  . MRSA, PCR 02/18/2012 NEGATIVE  NEGATIVE Final  . Staphylococcus aureus 02/18/2012 NEGATIVE  NEGATIVE Final   Comment:                                 The Xpert SA Assay (FDA                          approved for NASAL specimens                          only), is one component of                          a comprehensive surveillance                          program.  It is not intended                          to diagnose infection nor to                          guide or monitor treatment.  . WBC 02/18/2012 6.8  4.0 - 10.5 K/uL Final  . RBC 02/18/2012 4.89  3.87 - 5.11 MIL/uL  Final  . Hemoglobin 02/18/2012 14.8  12.0 - 15.0 g/dL Final  . HCT 78/29/5621 44.6  36.0 - 46.0 % Final  . MCV 02/18/2012 91.2  78.0 - 100.0 fL Final  . MCH 02/18/2012 30.3  26.0 - 34.0 pg Final  . MCHC 02/18/2012 33.2  30.0 - 36.0 g/dL Final  . RDW 30/86/5784 12.6  11.5 - 15.5 % Final  . Platelets 02/18/2012 326  150 - 400 K/uL Final  . Neutrophils Relative 02/18/2012 56  43 - 77 % Final  . Neutro Abs 02/18/2012 3.8  1.7 - 7.7 K/uL Final  . Lymphocytes Relative 02/18/2012 33  12 - 46 % Final  . Lymphs Abs 02/18/2012 2.2  0.7 - 4.0 K/uL Final  .  Monocytes Relative 02/18/2012 8  3 - 12 % Final  . Monocytes Absolute 02/18/2012 0.6  0.1 - 1.0 K/uL Final  . Eosinophils Relative 02/18/2012 3  0 - 5 % Final  . Eosinophils Absolute 02/18/2012 0.2  0.0 - 0.7 K/uL Final  . Basophils Relative 02/18/2012 1  0 - 1 % Final  . Basophils Absolute 02/18/2012 0.1  0.0 - 0.1 K/uL Final  . Sodium 02/18/2012 138  135 - 145 mEq/L Final  . Potassium 02/18/2012 4.3  3.5 - 5.1 mEq/L Final  . Chloride 02/18/2012 102  96 - 112 mEq/L Final  . CO2 02/18/2012 28  19 - 32 mEq/L Final  . Glucose, Bld 02/18/2012 92  70 - 99 mg/dL Final  . BUN 16/04/9603 12  6 - 23 mg/dL Final  . Creatinine, Ser 02/18/2012 0.80  0.50 - 1.10 mg/dL Final  . Calcium 54/03/8118 9.4  8.4 - 10.5 mg/dL Final  . Total Protein 02/18/2012 7.3  6.0 - 8.3 g/dL Final  . Albumin 14/78/2956 4.1  3.5 - 5.2 g/dL Final  . AST 21/30/8657 20  0 - 37 U/L Final  . ALT 02/18/2012 16  0 - 35 U/L Final  . Alkaline Phosphatase 02/18/2012 76  39 - 117 U/L Final  . Total Bilirubin 02/18/2012 0.4  0.3 - 1.2 mg/dL Final  . GFR calc non Af Amer 02/18/2012 74* >90 mL/min Final  . GFR calc Af Amer 02/18/2012 86* >90 mL/min Final   Comment:                                 The eGFR has been calculated                          using the CKD EPI equation.                          This calculation has not been                          validated in all clinical                           situations.                          eGFR's persistently                          <90 mL/min signify                          possible Chronic Kidney Disease.  Marland Kitchen Prothrombin Time 02/18/2012 12.3  11.6 - 15.2 seconds Final  . INR 02/18/2012 0.90  0.00 - 1.49 Final  . aPTT 02/18/2012 29  24 - 37 seconds Final  . Color, Urine 02/18/2012 YELLOW  YELLOW Final  . APPearance 02/18/2012 CLEAR  CLEAR Final  . Specific Gravity, Urine 02/18/2012 1.008  1.005 - 1.030 Final  . pH 02/18/2012 7.0  5.0 - 8.0 Final  . Glucose, UA 02/18/2012 NEGATIVE  NEGATIVE mg/dL Final  . Hgb urine dipstick 02/18/2012 NEGATIVE  NEGATIVE Final  . Bilirubin Urine 02/18/2012 NEGATIVE  NEGATIVE Final  . Ketones, ur 02/18/2012 NEGATIVE  NEGATIVE mg/dL Final  . Protein, ur 16/04/9603 NEGATIVE  NEGATIVE mg/dL Final  . Urobilinogen, UA 02/18/2012 0.2  0.0 - 1.0 mg/dL Final  . Nitrite 54/03/8118 NEGATIVE  NEGATIVE Final  . Leukocytes, UA 02/18/2012 SMALL* NEGATIVE Final  . Squamous Epithelial / LPF 02/18/2012 FEW* RARE Final  . WBC, UA 02/18/2012 7-10  <3 WBC/hpf Final  . Bacteria, UA 02/18/2012 FEW* RARE Final  . Daryll Drown 02/18/2012 MUCOUS PRESENT   Final   No results found for this basename: HGB:5 in the last 72 hours No results found for this basename: WBC:2,RBC:2,HCT:2,PLT:2 in the last 72 hours No results found for this basename: NA:2,K:2,CL:2,CO2:2,BUN:2,CREATININE:2,GLUCOSE:2,CALCIUM:2 in the last 72 hours No results found for this basename: LABPT:2,INR:2 in the last 72 hours  X-Rays:Dg Chest 2 View  02/26/2012  *RADIOLOGY REPORT*  Clinical Data: Postop hypoxia, cough  CHEST - 2 VIEW  Comparison: 02/18/2012  Findings: Linear atelectasis in the right mid lung.  Mild patchy right basilar atelectasis. No pleural effusion or pneumothorax.  Heart is top normal in size.  Surgical clips overlying the left lateral chest wall / axilla.  Mild degenerative changes of the visualized thoracolumbar spine.   IMPRESSION: Mild right lung atelectasis.  Original Report Authenticated By: Charline Bills, M.D.   Dg Chest 2 View  02/18/2012  *RADIOLOGY REPORT*  Clinical Data: Preoperative respiratory exam.  Arthritis of the right knee.  CHEST - 2 VIEW  Comparison: None.  Findings: The heart size and pulmonary vascularity are normal and the lungs are clear.  Evidence of previous left breast surgery.  No acute osseous abnormalities.  IMPRESSION: No acute disease in the chest.  Original Report Authenticated By: Gwynn Burly, M.D.    EKG: Orders placed in visit on 08/19/11  . EKG 12-LEAD     Hospital Course: Patient was admitted to [facility] and taken to the OR and underwent the above state procedure without complications.  Patient tolerated the procedure well and was later transferred to the recovery room and then to the orthopaedic floor for postoperative care.  They were given PO and IV analgesics for pain control following their surgery.  They were given 24 hours of postoperative antibiotics and started on DVT prophylaxis in the form of Aspirin.   PT and OT were ordered for total joint protocol.  Discharge planning consulted to help with postop disposition and equipment needs.  Patient had a decent night on the evening of surgery and started to get up OOB with therapy on day one.  PCA Morphine was discontinued and they were weaned over to PO meds.  Hemovac drain was pulled without difficulty.  Continued to work with therapy into day two.  The previous night staff noted:  Patient has not voided since catheter removal. IVF running at 30 cc/hr and patient reports that because of nausea she did drink as much fluid as normal. Advised patient to increase PO intake. Received order from Dr. Shelle Iron to give patient a 250cc NS bolus and increase IVF to 50 cc/hr. Will continue to monitor.  Patient voiding better the next day. Had low UOP yesterday but has picked up and OUTPUT looks better this morning. Will CXR  to R/O any fluid buildup or effusions/edema. *RADIOLOGY REPORT*  Clinical Data: Postop hypoxia, cough  CHEST - 2 VIEW  Comparison: 02/18/2012  Findings: Linear atelectasis in the right mid lung. Mild patchy  right basilar atelectasis. No pleural effusion or pneumothorax.  Heart is top normal in size.  Surgical clips overlying the  left lateral chest wall / axilla.  Mild degenerative changes of the visualized thoracolumbar spine.  IMPRESSION:  Mild right lung atelectasis. Dressing was changed on day two and the incision was healing well.  By day three, the patient had progressed with therapy and meeting their goals.  Incision was healing well.  Patient was seen in rounds and was ready to go home.  Patient had episodes of O2 saturation declining the last 24 hours. Day shift RN had obtained order to have an ABG performed. Before it was performed Dr. Lequita Halt visited the patient at the bedside and canceled it. Patient was placed on continuous pulse oximetry. IS, coughing, and deep breathing encouraged. Doing better this morning. O2 Sats are better today on room air. Dr. Lequita Halt discussed the drop in sats at night and recommended the patient to get evaluated for sleep apnea int he future.   Discharge Medications: Prior to Admission medications   Medication Sig Start Date End Date Taking? Authorizing Provider  cetirizine (ZYRTEC) 10 MG tablet Take 10 mg by mouth every evening.    Yes Historical Provider, MD  niacin (NIASPAN) 500 MG CR tablet Take 1 tablet (500 mg total) by mouth at bedtime. After evening meal with applesauce 08/19/11 08/18/12 Yes Pecola Lawless, MD  simvastatin (ZOCOR) 20 MG tablet Take 20 mg by mouth every evening.   Yes Historical Provider, MD  methocarbamol (ROBAXIN) 500 MG tablet Take 1 tablet (500 mg total) by mouth every 6 (six) hours as needed. 02/27/12 03/08/12  Alexzandrew Perkins, PA  mometasone (NASONEX) 50 MCG/ACT nasal spray Place 2 sprays into the nose daily as needed. For  allergies    Historical Provider, MD  oxyCODONE (OXY IR/ROXICODONE) 5 MG immediate release tablet Take 1-2 tablets (5-10 mg total) by mouth every 4 (four) hours as needed for pain. 02/27/12 03/08/12  Alexzandrew Julien Girt, PA  rivaroxaban (XARELTO) 10 MG TABS tablet Take 1 tablet (10 mg total) by mouth daily with breakfast. Take Xarelto for two and a half more weeks, then discontinue Xarelto. Once the patient has completed the Xarelto, they may resume the 81 mg Aspirin. 02/27/12   Alexzandrew Julien Girt, PA    Diet: Cardiac diet Activity:WBAT Follow-up:in 2 weeks Disposition - Home Discharged Condition: good   Discharge Orders    Future Orders Please Complete By Expires   Diet - low sodium heart healthy      Call MD / Call 911      Comments:   If you experience chest pain or shortness of breath, CALL 911 and be transported to the hospital emergency room.  If you develope a fever above 101 F, pus (white drainage) or increased drainage or redness at the wound, or calf pain, call your surgeon's office.   Discharge instructions      Comments:   Pick up stool softner and laxative for home. Do not submerge incision under water. May shower. Continue to use ice for pain and swelling from surgery.  Take Xarelto for two and a half more weeks, then discontinue Xarelto. Once the patient has completed the Xarelto, they may resume the 81 mg Aspirin.   Constipation Prevention      Comments:   Drink plenty of fluids.  Prune juice may be helpful.  You may use a stool softener, such as Colace (over the counter) 100 mg twice a day.  Use MiraLax (over the counter) for constipation as needed.   Increase activity slowly as tolerated      Patient may shower  Comments:   You may shower without a dressing once there is no drainage.  Do not wash over the wound.  If drainage remains, do not shower until drainage stops.   Driving restrictions      Comments:   No driving until released by the physician.    Lifting restrictions      Comments:   No lifting until released by the physician.   TED hose      Comments:   Use stockings (TED hose) for 3 weeks on both leg(s).  You may remove them at night for sleeping.   Change dressing      Comments:   Change dressing daily with sterile 4 x 4 inch gauze dressing and apply TED hose. Do not submerge the incision under water.   Do not put a pillow under the knee. Place it under the heel.      Do not sit on low chairs, stoools or toilet seats, as it may be difficult to get up from low surfaces        Medication List  As of 03/09/2012 10:33 AM   STOP taking these medications         aspirin 81 MG tablet      calcium-vitamin D 500-200 MG-UNIT per tablet      cholecalciferol 1000 UNITS tablet         TAKE these medications         cetirizine 10 MG tablet   Commonly known as: ZYRTEC   Take 10 mg by mouth every evening.      mometasone 50 MCG/ACT nasal spray   Commonly known as: NASONEX   Place 2 sprays into the nose daily as needed. For allergies      niacin 500 MG CR tablet   Commonly known as: NIASPAN   Take 1 tablet (500 mg total) by mouth at bedtime. After evening meal with applesauce      rivaroxaban 10 MG Tabs tablet   Commonly known as: XARELTO   Take 1 tablet (10 mg total) by mouth daily with breakfast. Take Xarelto for two and a half more weeks, then discontinue Xarelto.  Once the patient has completed the Xarelto, they may resume the 81 mg Aspirin.      simvastatin 20 MG tablet   Commonly known as: ZOCOR   Take 20 mg by mouth every evening.           Follow-up Information    Follow up with Loanne Drilling, MD. Schedule an appointment as soon as possible for a visit in 2 weeks.   Contact information:   [facility] [address] [place] [address] [phone]          Signed: Patrica Duel 03/09/2012, 10:33 AM

## 2012-02-27 NOTE — Progress Notes (Signed)
   Subjective: 3 Days Post-Op Procedure(s) (LRB): TOTAL KNEE ARTHROPLASTY (Right) Patient reports pain as mild.   Patient seen in rounds with Dr. Lequita Halt. Patient is well, and has had no acute complaints or problems Patient is ready to go home today.  Patient had episodes of O2 saturation declining the last 24 hours. Day shift RN had obtained order to have an ABG performed. Before it was performed Dr. Lequita Halt visited the patient at the bedside and canceled it. Patient was placed on continuous pulse oximetry. IS, coughing, and deep breathing encouraged.  Doing better this morning.  O2 Sats are better today on room air.  Dr. Lequita Halt discussed the drop in sats at night and recommended the patient to get evaluated for sleep apnea int he future.   Objective: Vital signs in last 24 hours: Temp:  [97.9 F (36.6 C)-98.2 F (36.8 C)] 97.9 F (36.6 C) (08/15 0442) Pulse Rate:  [98-106] 103  (08/15 0442) Resp:  [16] 16  (08/15 0442) BP: (119-124)/(80-86) 124/86 mmHg (08/15 0442) SpO2:  [83 %-98 %] 93 % (08/15 0442)  Intake/Output from previous day:  Intake/Output Summary (Last 24 hours) at 02/27/12 0948 Last data filed at 02/27/12 0900  Gross per 24 hour  Intake   1320 ml  Output   2325 ml  Net  -1005 ml    Intake/Output this shift: Total I/O In: 240 [P.O.:240] Out: -   Labs:  Basename 02/27/12 0405 02/26/12 0551 02/25/12 0600  HGB 11.0* 11.4* 12.3    Basename 02/27/12 0405 02/26/12 0551  WBC 11.5* 11.8*  RBC 3.62* 3.67*  HCT 33.2* 33.5*  PLT 241 239    Basename 02/26/12 0551 02/25/12 0600  NA 136 136  K 4.1 4.2  CL 101 101  CO2 29 27  BUN 9 11  CREATININE 0.84 0.63  GLUCOSE 109* 121*  CALCIUM 8.4 8.4   No results found for this basename: LABPT:2,INR:2 in the last 72 hours  EXAM: General - Patient is Alert, Appropriate and Oriented Extremity - Neurovascular intact Sensation intact distally Dorsiflexion/Plantar flexion intact No cellulitis present Incision -  clean, dry, no drainage, healing Motor Function - intact, moving foot and toes well on exam.   Assessment/Plan: 3 Days Post-Op Procedure(s) (LRB): TOTAL KNEE ARTHROPLASTY (Right) Procedure(s) (LRB): TOTAL KNEE ARTHROPLASTY (Right) Past Medical History  Diagnosis Date  . Lymphedema 2000    LUE  . Hyperlipidemia   . perennial allergies   . PONV (postoperative nausea and vomiting)   . 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 Arthritis    Principal Problem:  *OA (osteoarthritis) of knee Active Problems:  Expected Blood loss anemia   Discharge home with home health Diet - Cardiac diet Follow up - in 2 weeks Activity - WBAT Disposition - Home Condition Upon Discharge - Good D/C Meds - See DC Summary DVT Prophylaxis - Xarelto, ASA 81 mg on hold for now   Patrica Duel 02/27/2012, 9:48 AM

## 2012-02-27 NOTE — Care Management Note (Signed)
    Page 1 of 2   02/27/2012     12:20:30 PM   CARE MANAGEMENT NOTE 02/27/2012  Patient:  Tanya [name], Tanya [name]   Account Number:  [id number]  Date Initiated:  02/25/2012  Documentation initiated by:  Colleen Can  Subjective/Objective Assessment:   DX OSTEOARTHRITIS RT KNEE; TOTAL KNEE REPLACEMNT     Action/Plan:   CM spoke with patient. Plans are for patient to return to her home in [place], [place] where spouse and daughter will be caregivers. Pt states she has RW , shower chair and commode seat. She is requesting Genevieve Norlander  for [facility] services.   Anticipated DC Date:  02/27/2012   Anticipated DC Plan:  HOME W HOME HEALTH SERVICES  In-house referral  NA      DC Planning Services  CM consult      [facility] Choice  HOME HEALTH   Choice offered to / List presented to:  C-1 Patient   DME arranged  NA      DME agency  NA     HH arranged  HH-2 PT      [facility] agency  [facility]   Status of service:  Completed, signed off Medicare Important Message given?  NA - LOS <3 / Initial given by admissions (If response is "NO", the following Medicare IM given date fields will be blank) Date Medicare IM given:   Date Additional Medicare IM given:    Discharge Disposition:  HOME W HOME HEALTH SERVICES  Per UR Regulation:    If discussed at Long Length of Stay Meetings, dates discussed:    Comments:  02/27/2012 Raynelle Bring BSN CCM [phone] Pt for discharge today . [facility] will start services tomorow 02/28/2012.

## 2012-03-09 DIAGNOSIS — J9811 Atelectasis: Secondary | ICD-10-CM | POA: Diagnosis not present

## 2012-04-22 ENCOUNTER — Encounter: Payer: Self-pay | Admitting: Internal Medicine

## 2012-04-22 ENCOUNTER — Ambulatory Visit (INDEPENDENT_AMBULATORY_CARE_PROVIDER_SITE_OTHER): Payer: Medicare Other | Admitting: Internal Medicine

## 2012-04-22 VITALS — BP 126/84 | HR 100 | Wt 223.0 lb

## 2012-04-22 DIAGNOSIS — M25569 Pain in unspecified knee: Secondary | ICD-10-CM

## 2012-04-22 DIAGNOSIS — M25561 Pain in right knee: Secondary | ICD-10-CM

## 2012-04-22 DIAGNOSIS — L500 Allergic urticaria: Secondary | ICD-10-CM

## 2012-04-22 MED ORDER — MELOXICAM 7.5 MG PO TABS: ORAL_TABLET | ORAL | Status: DC

## 2012-04-22 NOTE — Patient Instructions (Addendum)
Hold Niaspan while on the pain medications. Go to [facility] for urticaria or hives.Review and correct the record as indicated. Please share record with all medical staff seen.

## 2012-04-22 NOTE — Progress Notes (Signed)
  Subjective:    Patient ID: Tanya [name], female    DOB: [birth year], 66 y.o.   MRN: [id number]  HPI She underwent total knee arthroscopy 02/24/12; postoperatively she developed urticarial lesions below the waist  in the context of oxycodone, methocarbamol and hydromorphone.  She  switched to tramadol as an outpatient, but again developed urticaria. The urticaria resolved with a week's course of prednisone.  She continues to have pain and is involved in rehabilitation. She is seeking some medication for pain relief. While taking Advil and Tylenol she also noted urticarial lesions    Review of Systems She has been on niacin; she denies significant flushing. She's had no associated itchy, watery eyes or sneezing. She also denies shortness of breath or wheezing.     Objective:   Physical Exam General appearance:good health ;well nourished; no acute distress or increased work of breathing is present.  No  lymphadenopathy about the head, neck, or axilla noted.   Eyes: No conjunctival inflammation or lid edema is present. There is no scleral icterus.  Ears:  External ear exam shows no significant lesions or deformities.  Otoscopic examination reveals clear canals, tympanic membranes are intact bilaterally without bulging, retraction, inflammation or discharge.  Nose:  External nasal examination shows no deformity or inflammation. Nasal mucosa are pink and moist without lesions or exudates. No septal dislocation or deviation.No obstruction to airflow.   Oral exam: Dental hygiene is good; lips and gums are healthy appearing.There is no oropharyngeal erythema or exudate noted.   Neck:  No deformities,  masses, or tenderness noted.     Heart:  Normal rate and regular rhythm. S1 and S2 normal without gallop, murmur, click, rub or other extra sounds.   Lungs:Chest clear to auscultation; no wheezes, rhonchi,rales ,or rubs present.No increased work of breathing.    Extremities:  No cyanosis or  clubbing  noted . Lipedema   Skin: Warm & dry          Assessment & Plan:  #1 urticaria with multiple pain medications.  Plan: Trial of meloxicam 7.5 twice a day as needed. If this isn't effective or if she develops urticaria; fentanyl patch could be considered.

## 2012-05-11 ENCOUNTER — Telehealth: Payer: Self-pay

## 2012-05-11 NOTE — Telephone Encounter (Signed)
Pt states she stoped taking niaspan because when taking it and using tylenol or aleve for pain pt believes it triggers a reaction. Pt states has been having break out/ rashes. Pt is requesting a different cholesterol Rx.  Plz advise   MW

## 2012-05-11 NOTE — Telephone Encounter (Signed)
Stay off the Niaspan for 10-12 weeks. Please review Dr Gildardo Griffes book Eat, Drink & Be Healthy for dietary cholesterol information. Please  schedule fasting Labs :Lipid Panel after 4 months of dietary changes to optimally assess LDL risk.  272.4

## 2012-05-12 NOTE — Telephone Encounter (Signed)
Called and left pt a message to call me.    MW

## 2012-05-12 NOTE — Telephone Encounter (Signed)
Pt returned your call. I advised her of Dr. Frederik Pear instructions and scheduled her lab work. Pt voiced understanding. BC

## 2012-05-12 NOTE — Telephone Encounter (Signed)
Called pt home number since I called her cell phone before. Pt husband answer and since pt wasn't there he said he would have her call us.        MW

## 2012-07-15 DIAGNOSIS — M719 Bursopathy, unspecified: Secondary | ICD-10-CM

## 2012-07-15 HISTORY — DX: Bursopathy, unspecified: M71.9

## 2012-08-13 ENCOUNTER — Other Ambulatory Visit: Payer: Self-pay | Admitting: Internal Medicine

## 2012-09-10 ENCOUNTER — Other Ambulatory Visit: Payer: Medicare Other

## 2012-10-07 ENCOUNTER — Other Ambulatory Visit (INDEPENDENT_AMBULATORY_CARE_PROVIDER_SITE_OTHER): Payer: Medicare Other

## 2012-10-07 DIAGNOSIS — E785 Hyperlipidemia, unspecified: Secondary | ICD-10-CM

## 2012-10-07 DIAGNOSIS — E559 Vitamin D deficiency, unspecified: Secondary | ICD-10-CM

## 2012-10-07 LAB — LIPID PANEL
Cholesterol: 184 mg/dL (ref 0–200)
HDL: 62.5 mg/dL (ref >39.00)
LDL Cholesterol: 98 mg/dL (ref 0–99)
Total CHOL/HDL Ratio: 3
Triglycerides: 120 mg/dL (ref 0.0–149.0)

## 2012-10-11 LAB — VITAMIN D 1,25 DIHYDROXY: Vitamin D2 1, 25 (OH)2: 25 pg/mL

## 2012-10-12 ENCOUNTER — Telehealth: Payer: Self-pay | Admitting: *Deleted

## 2012-10-12 NOTE — Telephone Encounter (Signed)
Leave Niacin off until next lipid panel to help assess need for it

## 2012-10-12 NOTE — Telephone Encounter (Signed)
Patient left message on triage line stating that she is no longer taking pain medications and wants to know if she needs to start back on Niacin. Previous notes advised her to stop niacin while on pain medications. Please advise.

## 2012-10-13 NOTE — Telephone Encounter (Signed)
Called pts work number [email or web address] to stop niacin until next lipid panel, at that time we will assess the need for it.

## 2012-11-24 ENCOUNTER — Other Ambulatory Visit: Payer: Self-pay | Admitting: Internal Medicine

## 2013-05-20 ENCOUNTER — Telehealth: Payer: Self-pay | Admitting: Nurse Practitioner

## 2013-05-20 DIAGNOSIS — Z780 Asymptomatic menopausal state: Secondary | ICD-10-CM

## 2013-05-20 NOTE — Telephone Encounter (Signed)
Chief Complaint  Patient presents with   Appointment  pt needs an order for a bone density faxed to [facility]

## 2013-05-20 NOTE — Telephone Encounter (Signed)
Per paper chart on your desk, last BMD 05-2010. OK for order for BMD to [facility]?

## 2013-05-23 ENCOUNTER — Other Ambulatory Visit: Payer: Self-pay | Admitting: Internal Medicine

## 2013-05-24 NOTE — Telephone Encounter (Signed)
Simvastatin refill sent to pharmacy 

## 2013-05-31 NOTE — Telephone Encounter (Signed)
Patient has appointment scheduled for 11/19 at 8 am. They have order.

## 2013-06-11 ENCOUNTER — Encounter: Payer: Self-pay | Admitting: Internal Medicine

## 2013-06-11 DIAGNOSIS — M8580 Other specified disorders of bone density and structure, unspecified site: Secondary | ICD-10-CM | POA: Insufficient documentation

## 2013-06-15 ENCOUNTER — Telehealth: Payer: Self-pay | Admitting: *Deleted

## 2013-06-15 NOTE — Telephone Encounter (Signed)
Message left for pt to return call for bone density results.  Hardcopy on my desk.

## 2013-06-17 NOTE — Telephone Encounter (Signed)
Pt notified of bone density results.  Hardcopy sent to be scanned.

## 2013-06-20 ENCOUNTER — Other Ambulatory Visit: Payer: Self-pay | Admitting: Internal Medicine

## 2013-06-21 NOTE — Telephone Encounter (Signed)
Simvastatin refilled for one month. Pt has appt 06/23/13 with Dr.Hopper.

## 2013-06-23 ENCOUNTER — Ambulatory Visit (INDEPENDENT_AMBULATORY_CARE_PROVIDER_SITE_OTHER): Payer: Medicare Other | Admitting: Internal Medicine

## 2013-06-23 ENCOUNTER — Encounter: Payer: Self-pay | Admitting: Internal Medicine

## 2013-06-23 VITALS — BP 123/82 | HR 75 | Temp 97.6°F | Ht 65.75 in | Wt 240.2 lb

## 2013-06-23 DIAGNOSIS — M899 Disorder of bone, unspecified: Secondary | ICD-10-CM

## 2013-06-23 DIAGNOSIS — Z85828 Personal history of other malignant neoplasm of skin: Secondary | ICD-10-CM

## 2013-06-23 DIAGNOSIS — Z0000 Encounter for general adult medical examination without abnormal findings: Secondary | ICD-10-CM

## 2013-06-23 DIAGNOSIS — L719 Rosacea, unspecified: Secondary | ICD-10-CM

## 2013-06-23 DIAGNOSIS — Z853 Personal history of malignant neoplasm of breast: Secondary | ICD-10-CM

## 2013-06-23 DIAGNOSIS — E559 Vitamin D deficiency, unspecified: Secondary | ICD-10-CM

## 2013-06-23 DIAGNOSIS — M8580 Other specified disorders of bone density and structure, unspecified site: Secondary | ICD-10-CM

## 2013-06-23 DIAGNOSIS — E785 Hyperlipidemia, unspecified: Secondary | ICD-10-CM

## 2013-06-23 LAB — HEPATIC FUNCTION PANEL
ALT: 17 U/L (ref 0–35)
AST: 16 U/L (ref 0–37)
Alkaline Phosphatase: 69 U/L (ref 39–117)
Total Bilirubin: 0.7 mg/dL (ref 0.3–1.2)

## 2013-06-23 LAB — CBC WITH DIFFERENTIAL/PLATELET
Basophils Absolute: 0 10*3/uL (ref 0.0–0.1)
Eosinophils Absolute: 0.1 10*3/uL (ref 0.0–0.7)
HCT: 42.2 % (ref 36.0–46.0)
Hemoglobin: 14 g/dL (ref 12.0–15.0)
Lymphs Abs: 1.9 10*3/uL (ref 0.7–4.0)
MCHC: 33.3 g/dL (ref 30.0–36.0)
Monocytes Absolute: 0.5 10*3/uL (ref 0.1–1.0)
Neutro Abs: 4.1 10*3/uL (ref 1.4–7.7)
Platelets: 310 10*3/uL (ref 150.0–400.0)
RDW: 12.4 % (ref 11.5–14.6)

## 2013-06-23 LAB — BASIC METABOLIC PANEL
Calcium: 8.8 mg/dL (ref 8.4–10.5)
Creatinine, Ser: 0.9 mg/dL (ref 0.4–1.2)
GFR: 68.46 mL/min (ref >60.00)
Sodium: 141 mEq/L (ref 135–145)

## 2013-06-23 MED ORDER — METRONIDAZOLE 1 % EX GEL: Freq: Every day | CUTANEOUS | Status: DC

## 2013-06-23 MED ORDER — SIMVASTATIN 20 MG PO TABS: ORAL_TABLET | ORAL | Status: DC

## 2013-06-23 NOTE — Progress Notes (Signed)
Pre visit review using our clinic review tool, if applicable. No additional management support is needed unless otherwise documented below in the visit note. 

## 2013-06-23 NOTE — Patient Instructions (Signed)
Your next office appointment will be determined based upon review of your pending labs . Those instructions will be transmitted to you  by mail. 

## 2013-06-23 NOTE — Addendum Note (Signed)
Addended by: Wende Mott on: 06/23/2013 01:53 PM   Modules accepted: Orders

## 2013-06-23 NOTE — Progress Notes (Signed)
Subjective:    Patient ID: Tanya [name], female    DOB: 1945/12/09, 67 y.o.   MRN: [id number]  HPI Medicare Wellness Visit: Psychosocial and medical history were reviewed as required by Medicare (history related to abuse, antisocial behavior , firearm risk). Social history: Caffeine: decaf only , Alcohol: very rarely  , Tobacco WUJ:WJXBJ Exercise:see below Personal safety/fall risk:no Limitations of activities of daily living:no Seatbelt/ smoke alarm use:yes Healthcare Power of Attorney/Living Will status: in place Ophthalmologic exam status:current Hearing evaluation status: not current Orientation: Oriented X 3 Memory and recall: good Spelling  testing: good Depression/anxiety assessment: no Foreign travel history:1989  [place] Immunization status for influenza/pneumonia/ shingles /tetanus: current Transfusion history: Preventive health care maintenance status: Colonoscopy/BMD/mammogram/Pap as per protocol/standard care:current or pending Dental care:every 6 mos Chart reviewed and updated. Active issues reviewed and addressed as documented below.    Review of Systems A modified heart healthy diet is followed; exercise encompasses 30-60 minutes >6  times per week as water aerobics & walking without symptoms.  Family history is negative for premature coronary disease. Advanced cholesterol testing reveals  LDL goal is less than 130 ; ideally < 100 . There is medication compliance with the statin.  Low dose ASA taken Specifically denied are  chest pain, palpitations, dyspnea, or claudication.  Significant abdominal symptoms, memory deficit, or myalgias not present.     Objective:   Physical Exam Gen.:  well-nourished in appearance; weight excess. Alert, appropriate and cooperative throughout exam.Appears younger than stated age  Head: Normocephalic without obvious abnormalities  Eyes: No corneal or conjunctival inflammation noted. Pupils equal round reactive to light and  accommodation. Extraocular motion intact.  Ears: External  ear exam reveals no significant lesions or deformities. Canals clear .TMs normal. Hearing is grossly normal bilaterally. Nose: External nasal exam reveals no deformity or inflammation. Nasal mucosa are pink and moist. No lesions or exudates noted.  Mouth: Oral mucosa and oropharynx reveal no lesions or exudates. Teeth in good repair. Neck: No deformities, masses, or tenderness noted. Range of motion good. 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 Lungs: Normal respiratory effort; chest expands symmetrically. Lungs are clear to auscultation without rales, wheezes, or increased work of breathing. Heart: Normal rate and rhythm. Normal S1 and S2. No gallop, click, or rub. S4 w/o murmur. Abdomen: Bowel sounds normal; abdomen soft and nontender. No masses, organomegaly or hernias noted. Genitalia:  as per Gyn                                  Musculoskeletal/extremities: No deformity or scoliosis noted of  the thoracic or lumbar spine.   No clubbing, cyanosis, edema, or significant extremity  deformity noted. Range of motion normal .Tone & strength normal. Crepitus knees. Hand joints normal . Fingernail  health good. Able to lie down & sit up w/o help. Negative SLR bilaterally Vascular: Carotid, radial artery, dorsalis pedis and  posterior tibial pulses are full and equal. No bruits present. Neurologic: Alert and oriented x3. Deep tendon reflexes asymmetrical @ knees , R 1/2+     Skin: Facial rosacea. Lymph: No cervical, axillary, or inguinal lymphadenopathy present. Psych: Mood and affect are normal. Normally interactive  Assessment & Plan:  #1 Medicare Wellness Exam; criteria met ; data entered #2 Problem List/Diagnoses reviewed Plan:  Assessments made/ Orders entered  

## 2013-06-28 ENCOUNTER — Encounter: Payer: Self-pay | Admitting: *Deleted

## 2013-07-13 ENCOUNTER — Other Ambulatory Visit: Payer: Self-pay | Admitting: *Deleted

## 2013-07-13 DIAGNOSIS — E785 Hyperlipidemia, unspecified: Secondary | ICD-10-CM

## 2013-07-13 MED ORDER — SIMVASTATIN 20 MG PO TABS: ORAL_TABLET | ORAL | Status: DC

## 2013-07-30 ENCOUNTER — Encounter: Payer: Self-pay | Admitting: Nurse Practitioner

## 2013-08-12 ENCOUNTER — Encounter: Payer: Self-pay | Admitting: Internal Medicine

## 2013-11-24 ENCOUNTER — Telehealth: Payer: Self-pay | Admitting: Internal Medicine

## 2013-11-24 NOTE — Telephone Encounter (Signed)
Referral for Tanya [name] faxed to [employer] @ [phone]

## 2013-12-14 ENCOUNTER — Encounter: Payer: Self-pay | Admitting: Internal Medicine

## 2013-12-14 ENCOUNTER — Ambulatory Visit (INDEPENDENT_AMBULATORY_CARE_PROVIDER_SITE_OTHER): Payer: Commercial Managed Care - HMO | Admitting: Internal Medicine

## 2013-12-14 ENCOUNTER — Other Ambulatory Visit (INDEPENDENT_AMBULATORY_CARE_PROVIDER_SITE_OTHER): Payer: Commercial Managed Care - HMO

## 2013-12-14 VITALS — BP 114/80 | HR 70 | Temp 97.7°F | Ht 65.75 in | Wt 243.0 lb

## 2013-12-14 DIAGNOSIS — R7309 Other abnormal glucose: Secondary | ICD-10-CM

## 2013-12-14 DIAGNOSIS — E785 Hyperlipidemia, unspecified: Secondary | ICD-10-CM

## 2013-12-14 DIAGNOSIS — D489 Neoplasm of uncertain behavior, unspecified: Secondary | ICD-10-CM

## 2013-12-14 DIAGNOSIS — G252 Other specified forms of tremor: Secondary | ICD-10-CM

## 2013-12-14 DIAGNOSIS — K219 Gastro-esophageal reflux disease without esophagitis: Secondary | ICD-10-CM | POA: Insufficient documentation

## 2013-12-14 DIAGNOSIS — G250 Essential tremor: Secondary | ICD-10-CM

## 2013-12-14 DIAGNOSIS — Z85828 Personal history of other malignant neoplasm of skin: Secondary | ICD-10-CM

## 2013-12-14 LAB — BASIC METABOLIC PANEL
BUN: 17 mg/dL (ref 6–23)
CALCIUM: 9.2 mg/dL (ref 8.4–10.5)
CHLORIDE: 103 meq/L (ref 96–112)
CO2: 27 meq/L (ref 19–32)
Creatinine, Ser: 0.8 mg/dL (ref 0.4–1.2)
GFR: 72.19 mL/min (ref >60.00)
Glucose, Bld: 89 mg/dL (ref 70–99)
Potassium: 5 mEq/L (ref 3.5–5.1)
SODIUM: 138 meq/L (ref 135–145)

## 2013-12-14 LAB — CBC WITH DIFFERENTIAL/PLATELET
Basophils Absolute: 0 10*3/uL (ref 0.0–0.1)
Basophils Relative: 0.6 % (ref 0.0–3.0)
EOS PCT: 2 % (ref 0.0–5.0)
Eosinophils Absolute: 0.1 10*3/uL (ref 0.0–0.7)
HCT: 44 % (ref 36.0–46.0)
HEMOGLOBIN: 14.4 g/dL (ref 12.0–15.0)
Lymphocytes Relative: 27 % (ref 12.0–46.0)
Lymphs Abs: 1.9 10*3/uL (ref 0.7–4.0)
MCHC: 32.8 g/dL (ref 30.0–36.0)
MCV: 90.8 fl (ref 78.0–100.0)
MONOS PCT: 9 % (ref 3.0–12.0)
Monocytes Absolute: 0.6 10*3/uL (ref 0.1–1.0)
Neutro Abs: 4.4 10*3/uL (ref 1.4–7.7)
Neutrophils Relative %: 61.4 % (ref 43.0–77.0)
PLATELETS: 313 10*3/uL (ref 150.0–400.0)
RBC: 4.84 Mil/uL (ref 3.87–5.11)
RDW: 13 % (ref 11.5–15.5)
WBC: 7.1 10*3/uL (ref 4.0–10.5)

## 2013-12-14 LAB — HEPATIC FUNCTION PANEL
ALT: 19 U/L (ref 0–35)
AST: 22 U/L (ref 0–37)
Albumin: 4.1 g/dL (ref 3.5–5.2)
Alkaline Phosphatase: 63 U/L (ref 39–117)
Bilirubin, Direct: 0.1 mg/dL (ref 0.0–0.3)
Total Bilirubin: 0.6 mg/dL (ref 0.2–1.2)
Total Protein: 7 g/dL (ref 6.0–8.3)

## 2013-12-14 LAB — HEMOGLOBIN A1C: Hgb A1c MFr Bld: 5.8 % (ref 4.6–6.5)

## 2013-12-14 LAB — LIPID PANEL
Cholesterol: 166 mg/dL (ref 0–200)
HDL: 58.1 mg/dL (ref >39.00)
LDL Cholesterol: 90 mg/dL (ref 0–99)
Total CHOL/HDL Ratio: 3
Triglycerides: 90 mg/dL (ref 0.0–149.0)
VLDL: 18 mg/dL (ref 0.0–40.0)

## 2013-12-14 LAB — TSH: TSH: 2.07 u[IU]/mL (ref 0.35–4.50)

## 2013-12-14 MED ORDER — OMEPRAZOLE 20 MG PO CPDR: 20.0000 mg | DELAYED_RELEASE_CAPSULE | Freq: Every day | ORAL | Status: DC

## 2013-12-14 MED ORDER — SIMVASTATIN 20 MG PO TABS: ORAL_TABLET | ORAL | Status: DC

## 2013-12-14 NOTE — Assessment & Plan Note (Addendum)
New lesion R nose Derm referral

## 2013-12-14 NOTE — Assessment & Plan Note (Signed)
Lipids, LFTs, TSH  

## 2013-12-14 NOTE — Progress Notes (Signed)
Pre visit review using our clinic review tool, if applicable. No additional management support is needed unless otherwise documented below in the visit note. 

## 2013-12-14 NOTE — Patient Instructions (Signed)
Your next office appointment will be determined based upon review of your pending labs. Those instructions will be transmitted to you by mail. Reflux of gastric acid may be asymptomatic as this may occur mainly during sleep.The triggers for reflux  include stress; the "aspirin family" ; alcohol; peppermint; and caffeine (coffee, tea, cola, and chocolate). The aspirin family would include aspirin and the nonsteroidal agents such as ibuprofen &  Naproxen. Tylenol would not cause reflux. If having symptoms ; food & drink should be avoided for @ least 2 hours before going to bed.  To prevent tremors, avoid stimulants such as decongestants, diet pills, nicotine, or caffeine (coffee, tea, cola, or chocolate) to excess.

## 2013-12-14 NOTE — Progress Notes (Signed)
   Subjective:    Patient ID: Tanya [name], female    DOB: February 04, 1946, 68 y.o.   MRN: [id number]  HPI She  is here to assess active health issues & conditions. PMH, FH, & Social history verified & updated   Active issues include intention tremor intermittently.OTC PPI helps.   A heart healthy diet is followed; exercise encompasses 15-30  Min 5 days / week as walking or biking w/osymptoms.  Family history is negative for premature coronary disease. Advanced cholesterol testing reveals  LDL goal is less than 130 ; ideally < 100. There is medication compliance with the statin.  Low dose ASA taken Review of Systems Unexplained weight loss, abdominal pain,  dysphagia, melena, rectal bleeding, or persistently small caliber stools are denied. Specifically denied are  chest pain, palpitations, dyspnea, or claudication.  Significant memory deficit or myalgias not present.         Objective:   Physical Exam Gen.:  well-nourished in appearance. Weight excess.Alert, appropriate and cooperative throughout exam. Appears younger than stated age  Head: Normocephalic without obvious abnormalities  Eyes: No corneal or conjunctival inflammation noted. Pupils equal round reactive to light and accommodation. Extraocular motion intact. No lid lag or proptosis. Ears: External  ear exam reveals no significant lesions or deformities.  Nose: External nasal exam reveals no deformity or inflammation. Nasal mucosa erythematous over L septum. No lesions or exudates noted.  Septum to L Mouth: Oral mucosa and oropharynx reveal no lesions or exudates. Teeth in good repair. Neck: No deformities, masses, or tenderness noted. Range of motion normal.Thyroid : small goiter on R Lungs: Normal respiratory effort; chest expands symmetrically. Lungs are clear to auscultation without rales, wheezes, or increased work of breathing. Heart: Normal rate and rhythm. Normal S1 and S2. No gallop, click, or rub. S4 w/o  murmur. Abdomen: Bowel sounds normal; abdomen soft and nontender. No masses, organomegaly or hernias noted. Genitalia:  as per Gyn                                  Musculoskeletal/extremities:Accentuated curvature of upper thoracic spine. No clubbing, cyanosis, edema, or significant extremity  deformity noted. Range of motion normal .Tone & strength normal.  Fusiform knees. Hand joints normal Fingernail / toenail health good. Able to lie down & sit up w/o help. Negative SLR bilaterally Vascular: Carotid, radial artery, dorsalis pedis and  posterior tibial pulses are full and equal. No bruits present. Neurologic: Alert and oriented x3. Deep tendon reflexes symmetrical and normal.  Gait normal       Skin: Intact without  rashes.Small papule R external nose Lymph: No cervical, axillary lymphadenopathy present. Psych: Mood and affect are normal. Normally interactive                                                                                        Assessment & Plan:  See Current Assessment & Plan in Problem List under specific Diagnosis

## 2013-12-14 NOTE — Assessment & Plan Note (Signed)
A1c

## 2013-12-24 ENCOUNTER — Telehealth: Payer: Self-pay | Admitting: Internal Medicine

## 2013-12-24 NOTE — Telephone Encounter (Signed)
rec'd records from, [facility] Forwarding 12 pages to Dr.Hopper

## 2013-12-26 ENCOUNTER — Encounter: Payer: Self-pay | Admitting: Internal Medicine

## 2014-06-15 ENCOUNTER — Telehealth: Payer: Self-pay | Admitting: Internal Medicine

## 2014-06-15 NOTE — Telephone Encounter (Signed)
Patient is requesting a [employer] referral.  Has appt with Dr. Ubaldo Glassing with [facility] on 10/04/14.

## 2014-06-27 ENCOUNTER — Encounter: Payer: Medicare Other | Admitting: Family Medicine

## 2014-06-28 ENCOUNTER — Encounter: Payer: Commercial Managed Care - HMO | Admitting: Internal Medicine

## 2014-06-30 NOTE — Telephone Encounter (Signed)
[employer] auth # [id number] valid 10/03/13-04/02/2015 for 6 visits

## 2014-07-12 ENCOUNTER — Encounter: Payer: Self-pay | Admitting: Internal Medicine

## 2014-07-13 ENCOUNTER — Other Ambulatory Visit: Payer: Self-pay | Admitting: Internal Medicine

## 2014-07-13 ENCOUNTER — Telehealth: Payer: Self-pay | Admitting: Internal Medicine

## 2014-07-13 DIAGNOSIS — Z85828 Personal history of other malignant neoplasm of skin: Secondary | ICD-10-CM

## 2014-07-13 NOTE — Telephone Encounter (Signed)
Pt called in and would like a referral put in to see:  [facility]  Address: [address] [place], [place] [address]  [phone] Dr Rolm Bookbinder     She Already has an appt in March

## 2014-09-13 ENCOUNTER — Encounter: Payer: Commercial Managed Care - HMO | Admitting: Internal Medicine

## 2014-09-28 DIAGNOSIS — Z1231 Encounter for screening mammogram for malignant neoplasm of breast: Secondary | ICD-10-CM | POA: Diagnosis not present

## 2014-09-28 DIAGNOSIS — Z853 Personal history of malignant neoplasm of breast: Secondary | ICD-10-CM | POA: Diagnosis not present

## 2014-09-28 LAB — HM MAMMOGRAPHY

## 2014-09-29 ENCOUNTER — Encounter: Payer: Self-pay | Admitting: Internal Medicine

## 2014-10-04 DIAGNOSIS — Z85828 Personal history of other malignant neoplasm of skin: Secondary | ICD-10-CM | POA: Diagnosis not present

## 2014-10-04 DIAGNOSIS — D1801 Hemangioma of skin and subcutaneous tissue: Secondary | ICD-10-CM | POA: Diagnosis not present

## 2014-10-04 DIAGNOSIS — I788 Other diseases of capillaries: Secondary | ICD-10-CM | POA: Diagnosis not present

## 2014-10-04 DIAGNOSIS — L820 Inflamed seborrheic keratosis: Secondary | ICD-10-CM | POA: Diagnosis not present

## 2014-10-04 DIAGNOSIS — L814 Other melanin hyperpigmentation: Secondary | ICD-10-CM | POA: Diagnosis not present

## 2014-10-04 DIAGNOSIS — D2239 Melanocytic nevi of other parts of face: Secondary | ICD-10-CM | POA: Diagnosis not present

## 2014-10-04 DIAGNOSIS — D225 Melanocytic nevi of trunk: Secondary | ICD-10-CM | POA: Diagnosis not present

## 2014-10-04 DIAGNOSIS — L821 Other seborrheic keratosis: Secondary | ICD-10-CM | POA: Diagnosis not present

## 2014-10-18 ENCOUNTER — Encounter: Payer: Commercial Managed Care - HMO | Admitting: Internal Medicine

## 2014-12-28 ENCOUNTER — Encounter: Payer: Commercial Managed Care - HMO | Admitting: Internal Medicine

## 2015-02-17 ENCOUNTER — Encounter: Payer: Commercial Managed Care - HMO | Admitting: Internal Medicine

## 2015-03-06 ENCOUNTER — Other Ambulatory Visit: Payer: Self-pay | Admitting: Emergency Medicine

## 2015-03-06 ENCOUNTER — Telehealth: Payer: Self-pay | Admitting: Internal Medicine

## 2015-03-06 DIAGNOSIS — E785 Hyperlipidemia, unspecified: Secondary | ICD-10-CM

## 2015-03-06 MED ORDER — SIMVASTATIN 20 MG PO TABS: ORAL_TABLET | ORAL | Status: DC

## 2015-03-06 NOTE — Telephone Encounter (Signed)
Patient requesting refill for simvastatin (ZOCOR) 20 MG tablet [[id number]. She is scheduled in September for her physical Pharmacy is [facility] on [address].

## 2015-04-05 ENCOUNTER — Encounter: Payer: Self-pay | Admitting: Internal Medicine

## 2015-04-05 ENCOUNTER — Other Ambulatory Visit (INDEPENDENT_AMBULATORY_CARE_PROVIDER_SITE_OTHER): Payer: Commercial Managed Care - HMO

## 2015-04-05 ENCOUNTER — Ambulatory Visit (INDEPENDENT_AMBULATORY_CARE_PROVIDER_SITE_OTHER): Payer: Commercial Managed Care - HMO | Admitting: Internal Medicine

## 2015-04-05 VITALS — BP 120/86 | HR 61 | Temp 97.9°F | Resp 16 | Ht 64.0 in | Wt 258.0 lb

## 2015-04-05 DIAGNOSIS — Z23 Encounter for immunization: Secondary | ICD-10-CM

## 2015-04-05 DIAGNOSIS — K219 Gastro-esophageal reflux disease without esophagitis: Secondary | ICD-10-CM

## 2015-04-05 DIAGNOSIS — E042 Nontoxic multinodular goiter: Secondary | ICD-10-CM

## 2015-04-05 DIAGNOSIS — E785 Hyperlipidemia, unspecified: Secondary | ICD-10-CM

## 2015-04-05 DIAGNOSIS — E559 Vitamin D deficiency, unspecified: Secondary | ICD-10-CM

## 2015-04-05 DIAGNOSIS — G250 Essential tremor: Secondary | ICD-10-CM

## 2015-04-05 LAB — HEPATIC FUNCTION PANEL
ALBUMIN: 4.3 g/dL (ref 3.5–5.2)
ALT: 17 U/L (ref 0–35)
AST: 18 U/L (ref 0–37)
Alkaline Phosphatase: 67 U/L (ref 39–117)
Bilirubin, Direct: 0.1 mg/dL (ref 0.0–0.3)
TOTAL PROTEIN: 7.4 g/dL (ref 6.0–8.3)
Total Bilirubin: 0.6 mg/dL (ref 0.2–1.2)

## 2015-04-05 LAB — BASIC METABOLIC PANEL
BUN: 14 mg/dL (ref 6–23)
CHLORIDE: 105 meq/L (ref 96–112)
CO2: 31 meq/L (ref 19–32)
CREATININE: 0.83 mg/dL (ref 0.40–1.20)
Calcium: 9.1 mg/dL (ref 8.4–10.5)
GFR: 71.92 mL/min (ref >60.00)
Glucose, Bld: 96 mg/dL (ref 70–99)
POTASSIUM: 4.8 meq/L (ref 3.5–5.1)
Sodium: 140 mEq/L (ref 135–145)

## 2015-04-05 LAB — MAGNESIUM: MAGNESIUM: 2 mg/dL (ref 1.5–2.5)

## 2015-04-05 LAB — VITAMIN D 25 HYDROXY (VIT D DEFICIENCY, FRACTURES): VITD: 29.15 ng/mL — AB (ref 30.00–100.00)

## 2015-04-05 LAB — CBC WITH DIFFERENTIAL/PLATELET
BASOS PCT: 0.7 % (ref 0.0–3.0)
Basophils Absolute: 0.1 10*3/uL (ref 0.0–0.1)
EOS ABS: 0.2 10*3/uL (ref 0.0–0.7)
EOS PCT: 2.7 % (ref 0.0–5.0)
HEMATOCRIT: 44.5 % (ref 36.0–46.0)
HEMOGLOBIN: 15 g/dL (ref 12.0–15.0)
LYMPHS PCT: 28.3 % (ref 12.0–46.0)
Lymphs Abs: 2.3 10*3/uL (ref 0.7–4.0)
MCHC: 33.7 g/dL (ref 30.0–36.0)
MCV: 90.4 fl (ref 78.0–100.0)
Monocytes Absolute: 0.7 10*3/uL (ref 0.1–1.0)
Monocytes Relative: 8.5 % (ref 3.0–12.0)
NEUTROS ABS: 4.8 10*3/uL (ref 1.4–7.7)
Neutrophils Relative %: 59.8 % (ref 43.0–77.0)
PLATELETS: 321 10*3/uL (ref 150.0–400.0)
RBC: 4.93 Mil/uL (ref 3.87–5.11)
RDW: 12.9 % (ref 11.5–15.5)
WBC: 8 10*3/uL (ref 4.0–10.5)

## 2015-04-05 LAB — LIPID PANEL
CHOLESTEROL: 174 mg/dL (ref 0–200)
HDL: 59.3 mg/dL (ref >39.00)
LDL Cholesterol: 89 mg/dL (ref 0–99)
NonHDL: 114.84
TRIGLYCERIDES: 129 mg/dL (ref 0.0–149.0)
Total CHOL/HDL Ratio: 3
VLDL: 25.8 mg/dL (ref 0.0–40.0)

## 2015-04-05 LAB — TSH: TSH: 2.64 u[IU]/mL (ref 0.35–4.50)

## 2015-04-05 MED ORDER — SIMVASTATIN 20 MG PO TABS: ORAL_TABLET | ORAL | Status: DC

## 2015-04-05 MED ORDER — OMEPRAZOLE 20 MG PO CPDR: 20.0000 mg | DELAYED_RELEASE_CAPSULE | Freq: Every day | ORAL | Status: DC

## 2015-04-05 NOTE — Assessment & Plan Note (Signed)
TSH 

## 2015-04-05 NOTE — Progress Notes (Signed)
Pre visit review using our clinic review tool, if applicable. No additional management support is needed unless otherwise documented below in the visit note. 

## 2015-04-05 NOTE — Patient Instructions (Signed)
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 It is a tremor typically aggravated by purposeful activity. Additionally stimulants such as decongestants, diet pills, nicotine, or caffeine can aggravate it. If it is problematic the nonselective beta blocker propranolol could be prescribed.  To prevent sleep dysfunction follow these instructions for sleep hygiene. Do not read, watch TV, or eat in bed. Do not get into bed until you are ready to turn off the light &  to go to sleep. Do not ingest stimulants ( decongestants, diet pills, nicotine, caffeine) after the evening meal.Do not take daytime naps.Cardiovascular exercise, this can be as simple a program as walking, is recommended 30-45 minutes 3-4 times per week. If you're not exercising you should take 6-8 weeks to build up to this level.   Your next office appointment will be determined based upon review of your pending labs.  Those written interpretation of the lab results and instructions will be transmitted to you by mail for your records.  Critical results will be called.   Followup as needed for any active or acute issue. Please report any significant change in your symptoms.

## 2015-04-05 NOTE — Assessment & Plan Note (Signed)
Vitamin D level 

## 2015-04-05 NOTE — Assessment & Plan Note (Signed)
CBC and differential 

## 2015-04-05 NOTE — Assessment & Plan Note (Addendum)
TSH, BMET, magnesium  See AVS

## 2015-04-05 NOTE — Progress Notes (Signed)
   Subjective:    Patient ID: Tanya [name], female    DOB: 12-22-1945, 69 y.o.   MRN: [id number]  HPI The patient is here to assess status of active health conditions.  PMH, FH, & Social History reviewed & updated.No change in [place] as recorded.  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 She's been taking her mother to [facility] for her glaucoma. She's not been on heart healthy diet and not exercising. Previously she walked a mile every other day or rode a statinary bike. With exercise she had no cardiopulmonary  symptoms  Colonoscopy was negative in 2007; it will be due next year. She has no active GI symptoms  She has had an intention tremor for 4-5 years; this does appear to be getting worse recently. It affects her writing in the morning. She denies intake of any stimulants. She does have 3 cups of decaffeinated coffee in the morning.  She's had some vision changes related to cataracts.  She has nocturia nightly 1.  She has poor sleep and sleeps only 5 hours  Review of Systems  Chest pain, palpitations, tachycardia, exertional dyspnea, paroxysmal nocturnal dyspnea, claudication or edema are absent. No unexplained weight loss, abdominal pain, significant dyspepsia, dysphagia, melena, rectal bleeding, or persistently small caliber stools. Dysuria, pyuria, hematuria, frequency,  or polyuria are denied. Change in hair, skin, nails denied. No bowel changes of constipation or diarrhea. No intolerance to heat or cold.    Objective:   Physical Exam  Pertinent or positive findings include: BMI 44.26. Heart sounds are somewhat distant. She has varus changes in the knees with crepitus bilaterally.  General appearance :adequately nourished; in no distress.  Eyes: No conjunctival inflammation or scleral icterus is present.  Oral exam:  Lips and gums are healthy appearing.There is no oropharyngeal erythema or exudate noted. Dental hygiene is good.   Ear  exam is unremarkable with whisper at 6 feet.  Heart:  Normal rate and regular rhythm. S1 and S2 normal without gallop, murmur, click, rub or other extra sounds    Lungs:Chest clear to auscultation; no wheezes, rhonchi,rales ,or rubs present.No increased work of breathing.   Abdomen: bowel sounds normal, soft and non-tender without masses, organomegaly or hernias noted.  No guarding or rebound. Vascular : all pulses equal ; no bruits present.  Skin:Warm & dry.  Intact without suspicious lesions or rashes ; no tenting or jaundice   Lymphatic: No lymphadenopathy is noted about the head, neck, axilla.   Neuro: Strength, tone & DTRs normal.      Assessment & Plan:  See Current Assessment & Plan in Problem List under specific Diagnosis

## 2015-05-15 ENCOUNTER — Other Ambulatory Visit: Payer: Self-pay | Admitting: Internal Medicine

## 2015-05-15 ENCOUNTER — Telehealth: Payer: Self-pay | Admitting: Internal Medicine

## 2015-05-15 DIAGNOSIS — E2839 Other primary ovarian failure: Secondary | ICD-10-CM

## 2015-05-15 NOTE — Telephone Encounter (Signed)
done

## 2015-05-15 NOTE — Telephone Encounter (Signed)
Pt need order for [facility] for bone density, please call pt when order has been sent

## 2015-06-28 NOTE — Telephone Encounter (Signed)
faxed to [facility]  [phone] pt self scheduled 06/15/15 mp

## 2015-06-29 DIAGNOSIS — Z8262 Family history of osteoporosis: Secondary | ICD-10-CM | POA: Diagnosis not present

## 2015-06-29 DIAGNOSIS — M85851 Other specified disorders of bone density and structure, right thigh: Secondary | ICD-10-CM | POA: Diagnosis not present

## 2015-06-29 LAB — HM DEXA SCAN

## 2015-07-04 ENCOUNTER — Telehealth: Payer: Self-pay | Admitting: Internal Medicine

## 2015-07-04 NOTE — Telephone Encounter (Signed)
Let her know her dexa scan shows osteopenia.  There has been improvement in her bone density since 2014.  She should continue during what she is doing.

## 2015-07-06 NOTE — Telephone Encounter (Signed)
LVM for pt to call back for results.

## 2015-07-11 ENCOUNTER — Telehealth: Payer: Self-pay | Admitting: Internal Medicine

## 2015-07-11 DIAGNOSIS — M25569 Pain in unspecified knee: Secondary | ICD-10-CM

## 2015-07-11 NOTE — Telephone Encounter (Signed)
Spoke with pt to inform of Dexa Scan results. Pt stated that she lifted something yesterday on 07/10/15 and heard a pop in her knee. Pt is unable to walk without assistance.. Pt would like a referral to [place]. Please advise.

## 2015-07-11 NOTE — Telephone Encounter (Signed)
Spoke with pt's family member. Will have pt call back once she gets home.

## 2015-07-11 NOTE — Telephone Encounter (Signed)
Referral ordered

## 2015-07-13 ENCOUNTER — Encounter: Payer: Self-pay | Admitting: Internal Medicine

## 2015-07-25 ENCOUNTER — Encounter: Payer: Self-pay | Admitting: Internal Medicine

## 2015-08-14 ENCOUNTER — Telehealth: Payer: Self-pay | Admitting: Internal Medicine

## 2015-08-14 NOTE — Telephone Encounter (Signed)
Patient is calling referring to ENT referral. States that they have not received it at this point. She states that it is [facility] ENT- Dr Moody Bruins.

## 2015-08-15 ENCOUNTER — Encounter: Payer: Self-pay | Admitting: Internal Medicine

## 2015-08-15 ENCOUNTER — Ambulatory Visit (INDEPENDENT_AMBULATORY_CARE_PROVIDER_SITE_OTHER): Payer: Commercial Managed Care - HMO | Admitting: Internal Medicine

## 2015-08-15 VITALS — BP 118/64 | HR 61 | Temp 97.6°F | Resp 16 | Wt 257.0 lb

## 2015-08-15 DIAGNOSIS — R519 Headache, unspecified: Secondary | ICD-10-CM

## 2015-08-15 DIAGNOSIS — R51 Headache: Secondary | ICD-10-CM | POA: Diagnosis not present

## 2015-08-15 DIAGNOSIS — E785 Hyperlipidemia, unspecified: Secondary | ICD-10-CM

## 2015-08-15 MED ORDER — SIMVASTATIN 20 MG PO TABS: ORAL_TABLET | ORAL | Status: DC

## 2015-08-15 NOTE — Telephone Encounter (Signed)
Referral has been completed.

## 2015-08-15 NOTE — Progress Notes (Signed)
Subjective:    Patient ID: Tanya [name], female    DOB: 19-May-1946, 70 y.o.   MRN: [id number]  HPI She is here for an acute visit for left ear pain.   Her ear pain started three days ago.  The pain is around her left ear and radiates up to her approximately 3 inches. The pain is intermittent and can potentially radiation 6/10.  She describes the pain is sharp in nature. She thinks the frequency of the pain has decreased, but the intensity has not decreased. She has tried laying on that side and it has not helped. She has also tried a heat pack. The pain can be severe enough to wake her up all night. She did take Advil and it did help. She also took one of her husband strength all and seemed to help as well. She does have an appointment with her ear nose and throat doctor within the next month.   No pain with with eating. No hisotry of TMJ.  No swelling or redness.  She denies prior episodes.    Medications and allergies reviewed with patient and updated if appropriate.  Patient Active Problem List   Diagnosis Date Noted  . GERD (gastroesophageal reflux disease) 12/14/2013  . Essential tremor 12/14/2013  . Osteopenia 06/11/2013  . OA (osteoarthritis) of knee 02/24/2012  . FASTING HYPERGLYCEMIA 04/06/2009  . NONTOXIC MULTINODULAR GOITER 03/03/2008  . Hyperlipidemia 12/02/2007  . ALLERGIC RHINITIS 12/02/2007  . Personal history of malignant neoplasm of breast 12/02/2007  . SKIN CANCER, HX OF 12/02/2007  . Vitamin D deficiency 12/02/2007    Current Outpatient Prescriptions on File Prior to Visit  Medication Sig Dispense Refill  . aspirin EC 81 MG tablet Take 81 mg by mouth daily.    . Calcium Carbonate-Vitamin D (CALCIUM 600 + D PO) Take by mouth 2 (two) times daily.    . cholecalciferol (VITAMIN D) 1000 UNITS tablet Take 2,000 Units by mouth daily.    Marland Kitchen GARLIC PO Take by mouth.    . Omega-3 Fatty Acids (FISH OIL) 1000 MG CAPS Take 1 capsule by mouth daily.    Marland Kitchen omeprazole  (PRILOSEC) 20 MG capsule Take 1 capsule (20 mg total) by mouth daily. 90 capsule 3  . simvastatin (ZOCOR) 20 MG tablet TAKE 1 TABLET IN THE EVENING 90 tablet 3   No current facility-administered medications on file prior to visit.    Past Medical History  Diagnosis Date  . Lymphedema 2000    LUE  . Hyperlipidemia   . perennial allergies   . PONV (postoperative nausea and vomiting)   . Cancer 1986    breast cancer R  . Arthritis   . Bursitis 2014    Left; Dr Maureen Ralphs    Past Surgical History  Procedure Laterality Date  . 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 Breast reconstruction  1986  . Abdominal hysterectomy  1990    & BSO for fibroids  . Knee arthroscopy  2001 & 2011    L & R  . Ankle fracture surgery  2006  . Total knee arthroplasty  02/24/2012    Procedure: TOTAL KNEE ARTHROPLASTY;  Surgeon: Gearlean Alf, MD;  Location: [facility];  Service: Orthopedics;  Laterality: Right;  . Colonoscopy  04/14/2006    Tics ; Dr Earlean Shawl    Social History   Social History  . Marital Status: Married    Spouse Name: N/A  .  Number of Children: N/A  . Years of Education: N/A   Social History Main Topics  . Smoking status: Never Smoker   . Smokeless tobacco: Never Used  . Alcohol Use: Yes     Comment:  very rarely, 1-2 per month  . Drug Use: No  . Sexual Activity: Not on file   Other Topics Concern  . Not on file   Social History Narrative    Family History  Problem Relation Age of Onset  . Hypertension Mother   . Heart disease Mother     pacer  . Heart attack Maternal Grandmother 94  . Hypertension Maternal Grandmother   . Stroke Maternal Grandfather 15  . Hypertension Maternal Grandfather   . Cancer Neg Hx   . Diabetes Neg Hx   . Heart attack Maternal Uncle 65  . Colon polyps Mother     pre cancerous  . Glaucoma Mother     Review of Systems  Constitutional: Negative for fever and chills.  HENT: Negative for congestion, ear discharge,  ear pain, hearing loss, postnasal drip, sore throat and trouble swallowing.        No plugged sensation in ear  Musculoskeletal: Negative for neck pain and neck stiffness.  Skin: Negative for color change and rash.  Neurological: Negative for dizziness, light-headedness, numbness and headaches.       Objective:   Filed Vitals:   08/15/15 1538  BP: 118/64  Pulse: 61  Temp: 97.6 F (36.4 C)  Resp: 16   Filed Weights   08/15/15 1538  Weight: 257 lb (116.574 kg)   Body mass index is 44.09 kg/(m^2).   Physical Exam GENERAL APPEARANCE: Appears stated age, well appearing, NAD EYES: conjunctiva clear, no icterus HEENT: bilateral tympanic membranes and ear canals normal, oropharynx with no erythema, no thyromegaly, trachea midline, no cervical or supraclavicular lymphadenopathy,no tenderness with palpation on forehead,  temple region or near/around left ear, no parotid gland swelling or tenderness EXTREMITIES: Without clubbing, cyanosis, or edema MSK: no neck or base of skull tenderness with palpation SKIN: no redness or rash      Assessment & Plan:   Left-sided head pain Her exam is normal and the pain is not reproducible No evidence of infection Likely neurological nature-neuralgia. She does have an appointment with ENT next month and she will keep that appointment She will call if her symptoms worsen between now and then The Advil seems to help so she will take Advil or Aleve as needed She will monitor for any signs of redness, rash or skin changes

## 2015-08-15 NOTE — Patient Instructions (Signed)
  Try taking advil or aleve for your pain.    See your ENT as scheduled.   Call if your symptoms worsen or change.

## 2015-08-15 NOTE — Progress Notes (Signed)
Pre visit review using our clinic review tool, if applicable. No additional management support is needed unless otherwise documented below in the visit note. 

## 2015-08-29 DIAGNOSIS — J300 Vasomotor rhinitis: Secondary | ICD-10-CM | POA: Insufficient documentation

## 2015-08-29 DIAGNOSIS — J310 Chronic rhinitis: Secondary | ICD-10-CM | POA: Insufficient documentation

## 2015-08-29 DIAGNOSIS — H9202 Otalgia, left ear: Secondary | ICD-10-CM | POA: Diagnosis not present

## 2016-01-05 DIAGNOSIS — M7062 Trochanteric bursitis, left hip: Secondary | ICD-10-CM | POA: Diagnosis not present

## 2016-01-18 DIAGNOSIS — M7062 Trochanteric bursitis, left hip: Secondary | ICD-10-CM | POA: Diagnosis not present

## 2016-01-22 DIAGNOSIS — M7062 Trochanteric bursitis, left hip: Secondary | ICD-10-CM | POA: Diagnosis not present

## 2016-01-29 DIAGNOSIS — M7062 Trochanteric bursitis, left hip: Secondary | ICD-10-CM | POA: Diagnosis not present

## 2016-02-01 DIAGNOSIS — M7062 Trochanteric bursitis, left hip: Secondary | ICD-10-CM | POA: Diagnosis not present

## 2016-02-07 DIAGNOSIS — M7062 Trochanteric bursitis, left hip: Secondary | ICD-10-CM | POA: Diagnosis not present

## 2016-02-09 DIAGNOSIS — M7062 Trochanteric bursitis, left hip: Secondary | ICD-10-CM | POA: Diagnosis not present

## 2016-04-02 ENCOUNTER — Other Ambulatory Visit: Payer: Self-pay | Admitting: Internal Medicine

## 2016-04-02 DIAGNOSIS — K219 Gastro-esophageal reflux disease without esophagitis: Secondary | ICD-10-CM

## 2016-04-14 LAB — HM COLONOSCOPY

## 2016-04-18 ENCOUNTER — Encounter: Payer: Self-pay | Admitting: Internal Medicine

## 2016-04-18 ENCOUNTER — Other Ambulatory Visit (INDEPENDENT_AMBULATORY_CARE_PROVIDER_SITE_OTHER): Payer: Commercial Managed Care - HMO

## 2016-04-18 ENCOUNTER — Ambulatory Visit (INDEPENDENT_AMBULATORY_CARE_PROVIDER_SITE_OTHER): Payer: Commercial Managed Care - HMO | Admitting: Internal Medicine

## 2016-04-18 VITALS — BP 122/88 | HR 75 | Temp 97.8°F | Resp 16 | Ht 64.0 in | Wt 236.0 lb

## 2016-04-18 DIAGNOSIS — Z853 Personal history of malignant neoplasm of breast: Secondary | ICD-10-CM

## 2016-04-18 DIAGNOSIS — M8580 Other specified disorders of bone density and structure, unspecified site: Secondary | ICD-10-CM

## 2016-04-18 DIAGNOSIS — E559 Vitamin D deficiency, unspecified: Secondary | ICD-10-CM

## 2016-04-18 DIAGNOSIS — G250 Essential tremor: Secondary | ICD-10-CM

## 2016-04-18 DIAGNOSIS — Z0000 Encounter for general adult medical examination without abnormal findings: Secondary | ICD-10-CM

## 2016-04-18 DIAGNOSIS — R7309 Other abnormal glucose: Secondary | ICD-10-CM

## 2016-04-18 DIAGNOSIS — Z1211 Encounter for screening for malignant neoplasm of colon: Secondary | ICD-10-CM

## 2016-04-18 DIAGNOSIS — Z85828 Personal history of other malignant neoplasm of skin: Secondary | ICD-10-CM

## 2016-04-18 DIAGNOSIS — Z1159 Encounter for screening for other viral diseases: Secondary | ICD-10-CM

## 2016-04-18 DIAGNOSIS — E7800 Pure hypercholesterolemia, unspecified: Secondary | ICD-10-CM | POA: Diagnosis not present

## 2016-04-18 DIAGNOSIS — K219 Gastro-esophageal reflux disease without esophagitis: Secondary | ICD-10-CM

## 2016-04-18 LAB — CBC WITH DIFFERENTIAL/PLATELET
BASOS ABS: 0.1 10*3/uL (ref 0.0–0.1)
Basophils Relative: 0.7 % (ref 0.0–3.0)
Eosinophils Absolute: 0.3 10*3/uL (ref 0.0–0.7)
Eosinophils Relative: 2.9 % (ref 0.0–5.0)
HCT: 44.7 % (ref 36.0–46.0)
Hemoglobin: 15.2 g/dL — ABNORMAL HIGH (ref 12.0–15.0)
LYMPHS ABS: 2.2 10*3/uL (ref 0.7–4.0)
LYMPHS PCT: 25 % (ref 12.0–46.0)
MCHC: 33.9 g/dL (ref 30.0–36.0)
MCV: 91.3 fl (ref 78.0–100.0)
MONOS PCT: 7.1 % (ref 3.0–12.0)
Monocytes Absolute: 0.6 10*3/uL (ref 0.1–1.0)
NEUTROS PCT: 64.3 % (ref 43.0–77.0)
Neutro Abs: 5.6 10*3/uL (ref 1.4–7.7)
PLATELETS: 323 10*3/uL (ref 150.0–400.0)
RBC: 4.9 Mil/uL (ref 3.87–5.11)
RDW: 13.3 % (ref 11.5–15.5)
WBC: 8.8 10*3/uL (ref 4.0–10.5)

## 2016-04-18 LAB — COMPREHENSIVE METABOLIC PANEL
ALT: 17 U/L (ref 0–35)
AST: 16 U/L (ref 0–37)
Albumin: 4.1 g/dL (ref 3.5–5.2)
Alkaline Phosphatase: 72 U/L (ref 39–117)
BILIRUBIN TOTAL: 0.6 mg/dL (ref 0.2–1.2)
BUN: 21 mg/dL (ref 6–23)
CO2: 30 meq/L (ref 19–32)
CREATININE: 0.91 mg/dL (ref 0.40–1.20)
Calcium: 9.6 mg/dL (ref 8.4–10.5)
Chloride: 104 mEq/L (ref 96–112)
GFR: 64.48 mL/min (ref >60.00)
GLUCOSE: 97 mg/dL (ref 70–99)
Potassium: 5.2 mEq/L — ABNORMAL HIGH (ref 3.5–5.1)
Sodium: 142 mEq/L (ref 135–145)
Total Protein: 7.3 g/dL (ref 6.0–8.3)

## 2016-04-18 LAB — LIPID PANEL
CHOL/HDL RATIO: 3
CHOLESTEROL: 223 mg/dL — AB (ref 0–200)
HDL: 68.8 mg/dL (ref >39.00)
LDL CALC: 130 mg/dL — AB (ref 0–99)
NONHDL: 154.49
Triglycerides: 123 mg/dL (ref 0.0–149.0)
VLDL: 24.6 mg/dL (ref 0.0–40.0)

## 2016-04-18 LAB — TSH: TSH: 3.21 u[IU]/mL (ref 0.35–4.50)

## 2016-04-18 LAB — HEMOGLOBIN A1C: HEMOGLOBIN A1C: 5.6 % (ref 4.6–6.5)

## 2016-04-18 MED ORDER — SIMVASTATIN 20 MG PO TABS: ORAL_TABLET | ORAL | 3 refills | Status: DC

## 2016-04-18 NOTE — Assessment & Plan Note (Signed)
Taking vit d

## 2016-04-18 NOTE — Assessment & Plan Note (Signed)
a1c

## 2016-04-18 NOTE — Progress Notes (Signed)
Subjective:    Patient ID: Tanya [name], female    DOB: Aug 26, 1945, 70 y.o.   MRN: [id number]  HPI Here for medicare wellness exam and annual physical exam.   I have personally reviewed and have noted 1.The patient's medical and social history 2.Their use of alcohol, tobacco or illicit drugs 3.Their current medications and supplements 4.The patient's functional ability including ADL's, fall risks, home safety risks and hearing or visual impairment. 5.Diet and physical activities 6.Evidence for depression or mood disorders 7.Care team reviewed: eye - Dr Marica Otter, Dr lomax- derm   Are there smokers in your home (other than you)? No  Risk Factors Exercise: rides bike three times a week, walks a little Dietary issues discussed: struggles with sweets, considering weight watchers  Cardiac risk factors: advanced age, hyperlipidemia, and obesity (BMI >= 30 kg/m2).  Depression Screen  Have you felt down, depressed or hopeless? No  Have you felt little interest or pleasure in doing things?  No  Activities of Daily Living In your present state of health, do you have any difficulty performing the following activities?:  Driving? No Managing money?  No Feeding yourself? No Getting from bed to chair? No Climbing a flight of stairs? No Preparing food and eating?: No Bathing or showering? No Getting dressed: No Getting to/using the toilet? No Moving around from place to place: No In the past year have you fallen or had a near fall?: No   Are you sexually active?  No  Do you have more than one partner?  N/A  Hearing Difficulties: No Do you often ask people to speak up or repeat themselves? No Do you experience ringing or noises in your ears? No Do you have difficulty understanding soft or whispered voices? No Vision:              Any change in vision:  No             Up to date with eye exam:  Up to date    Memory:  Do you feel that you have a problem with memory? No  Do you often misplace items? No  Do you feel safe at home?  Yes  Cognitive Testing  Alert, Orientated? Yes  Normal Appearance? Yes  Recall of three objects?  Yes  Can perform simple calculations? Yes  Displays appropriate judgment? Yes  Can read the correct time from a watch face? Yes   Advanced Directives have been discussed with the patient? Yes   Medications and allergies reviewed with patient and updated if appropriate.  Patient Active Problem List   Diagnosis Date Noted  . GERD (gastroesophageal reflux disease) 12/14/2013  . Essential tremor 12/14/2013  . Osteopenia 06/11/2013  . OA (osteoarthritis) of knee 02/24/2012  . FASTING HYPERGLYCEMIA 04/06/2009  . NONTOXIC MULTINODULAR GOITER 03/03/2008  . Hyperlipidemia 12/02/2007  . ALLERGIC RHINITIS 12/02/2007  . Personal history of malignant neoplasm of breast 12/02/2007  . SKIN CANCER, HX OF 12/02/2007  . Vitamin D deficiency 12/02/2007    Current Outpatient Prescriptions on File Prior to Visit  Medication Sig Dispense Refill  . aspirin EC 81 MG tablet Take 81 mg by mouth daily.    Marland Kitchen azelastine (ASTELIN) 0.1 % nasal spray Place into both nostrils 2 (two) times daily. Use in each nostril as directed    . Calcium Carbonate-Vitamin D (CALCIUM 600 + D PO) Take by mouth 2 (two) times daily.    . Cetirizine HCl (KLS ALLER-TEC PO) Take by  mouth daily.    . cholecalciferol (VITAMIN D) 1000 UNITS tablet Take 2,000 Units by mouth daily.    Marland Kitchen FLUTICASONE PROPIONATE NA Place into the nose daily as needed.    Marland Kitchen GARLIC PO Take by mouth.    . Omega-3 Fatty Acids (FISH OIL) 1000 MG CAPS Take 1 capsule by mouth daily.    Marland Kitchen omeprazole (PRILOSEC) 20 MG capsule Take 1 capsule (20 mg total) by mouth daily. 90 capsule 3   No current facility-administered medications on file prior to visit.     Past Medical History:  Diagnosis Date  . Arthritis   . Bursitis 2014   Left; Dr  Maureen Ralphs  . Cancer [facility]) 1986   breast cancer R  . Hyperlipidemia   . Lymphedema 2000   LUE  . perennial allergies   . PONV (postoperative nausea and vomiting)     Past Surgical History:  Procedure Laterality Date  . ABDOMINAL HYSTERECTOMY  1990   & BSO for fibroids  . ANKLE FRACTURE SURGERY  2006  . BREAST RECONSTRUCTION  1986  . BREAST SURGERY  1986   double mastectonmy fror cancer on L  . COLONOSCOPY  04/14/2006   Tics ; Dr Earlean Shawl  . KNEE ARTHROSCOPY  2001 & 2011   L & R  . TOTAL KNEE ARTHROPLASTY  02/24/2012   Procedure: TOTAL KNEE ARTHROPLASTY;  Surgeon: Gearlean Alf, MD;  Location: [facility];  Service: Orthopedics;  Laterality: Right;    Social History   Social History  . Marital status: Married    Spouse name: N/A  . Number of children: N/A  . Years of education: N/A   Social History Main Topics  . Smoking status: Never Smoker  . Smokeless tobacco: Never Used  . Alcohol use Yes     Comment:  very rarely, 1-2 per month  . Drug use: No  . Sexual activity: Not on file   Other Topics Concern  . Not on file   Social History Narrative  . No narrative on file    Family History  Problem Relation Age of Onset  . Hypertension Mother   . Heart disease Mother     pacer  . Heart attack Maternal Grandmother 94  . Hypertension Maternal Grandmother   . Stroke Maternal Grandfather 29  . Hypertension Maternal Grandfather   . Cancer Neg Hx   . Diabetes Neg Hx   . Heart attack Maternal Uncle 65  . Colon polyps Mother     pre cancerous  . Glaucoma Mother     Review of Systems  Constitutional: Negative for appetite change, chills, fatigue, fever and unexpected weight change.  HENT: Negative for hearing loss and tinnitus.   Eyes: Negative for visual disturbance.  Respiratory: Negative for cough, shortness of breath and wheezing.   Cardiovascular: Negative for chest pain, palpitations and leg swelling.  Gastrointestinal: Positive for constipation (metamucil).  Negative for abdominal pain, blood in stool, diarrhea and nausea.       Gerd occ  Endocrine: Negative for polydipsia and polyuria.  Genitourinary: Negative for dysuria and hematuria.  Musculoskeletal: Positive for arthralgias (left hip - bursitis). Negative for back pain.  Skin: Negative for color change and rash.  Neurological: Negative for dizziness, light-headedness, numbness and headaches.  Psychiatric/Behavioral: Negative for dysphoric mood. The patient is not nervous/anxious.        Objective:   Vitals:   04/18/16 0757  BP: 122/88  Pulse: 75  Resp: 16  Temp: 97.8 F (36.6  C)   Filed Weights   04/18/16 0757  Weight: 236 lb (107 kg)   Body mass index is 40.51 kg/m.   Physical Exam Constitutional: She appears well-developed and well-nourished. No distress.  HENT:  Head: Normocephalic and atraumatic.  Right Ear: External ear normal. Normal ear canal and TM Left Ear: External ear normal.  Normal ear canal and TM Mouth/Throat: Oropharynx is clear and moist.  Eyes: Conjunctivae and EOM are normal.  Neck: Neck supple. No tracheal deviation present. No thyromegaly present.  No carotid bruit  Cardiovascular: Normal rate, regular rhythm and normal heart sounds.   No murmur heard.  No edema. Pulmonary/Chest: Effort normal and breath sounds normal. No respiratory distress. She has no wheezes. She has no rales.  Breast: deferred  - s/p b/l mastectomies Abdominal: Soft. She exhibits no distension. There is no tenderness.  Lymphadenopathy: She has no cervical adenopathy.  Skin: Skin is warm and dry. She is not diaphoretic.  Psychiatric: She has a normal mood and affect. Her behavior is normal.         Assessment & Plan:   Wellness Exam: Immunizations  Flu shot at [facility] soon, others up to date Colonoscopy - due  - will refer Mammogram   Up to date  Dexa   Up to date  Gyn - no longer seeing Eye exam  Up to date  Hearing loss  none Memory concerns/difficulties    none Independent of ADLs  fully Stressed the importance of regular exercise   Patient received copy of preventative screening tests/immunizations recommended for the next 5-10 years.   Physical exam: Screening blood work  ordered Immunizations  Flu vaccine will be given at [facility], other up to date Colonoscopy  Due - will refer Mammogram   Up to date  Gyn - no longer seeing Dexa  Up to date  Eye exams  Up to date  Exercise - some exercise - encouraged increasing her exercise Weight  - advised weight loss - may join weight watchers again Skin  - none - sees derm annually  - none Substance abuse  See Problem List for Assessment and Plan of chronic medical problems.

## 2016-04-18 NOTE — Patient Instructions (Addendum)
Ms. Tanya [name] , Thank you for taking time to come for your Medicare Wellness Visit. I appreciate your ongoing commitment to your health goals. Please review the following plan we discussed and let me know if I can assist you in the future.   These are the goals we discussed: Goals    Exercise and weight loss      This is a list of the screening recommended for you and due dates:  Health Maintenance  Topic Date Due  .  Hepatitis C: One time screening is recommended by Center for Disease Control  (CDC) for  adults born from [birth year] through 1967.   March 23, 1946  . Colon Cancer Screening  04/14/2016  . Flu Shot  10/13/2016*  . Mammogram  09/27/2016  . DEXA scan (bone density measurement)  06/28/2017  . Tetanus Vaccine  04/07/2019  . Shingles Vaccine  Completed  . Pneumonia vaccines  Completed  *Topic was postponed. The date shown is not the original due date.     Test(s) ordered today. Your results will be released to [place] (or called to you) after review, usually within 72hours after test completion. If any changes need to be made, you will be notified at that same time.  All other Health Maintenance issues reviewed.   All recommended immunizations and age-appropriate screenings are up-to-date or discussed.  No immunizations administered today.   Medications reviewed and updated.   No changes recommended at this time.  Your prescription(s) have been submitted to your pharmacy. Please take as directed and contact our office if you believe you are having problem(s) with the medication(s).  A referral was ordered for GI for a colonoscopy  Please followup in one year  Health Maintenance, Female Adopting a healthy lifestyle and getting preventive care can go a long way to promote health and wellness. Talk with your health care provider about what schedule of regular examinations is right for you. This is a good chance for you to check in with your provider about disease prevention and  staying healthy. In between checkups, there are plenty of things you can do on your own. Experts have done a lot of research about which lifestyle changes and preventive measures are most likely to keep you healthy. Ask your health care provider for more information. WEIGHT AND DIET  Eat a healthy diet  Be sure to include plenty of vegetables, fruits, low-fat dairy products, and lean protein.  Do not eat a lot of foods high in solid fats, added sugars, or salt.  Get regular exercise. This is one of the most important things you can do for your health.  Most adults should exercise for at least 150 minutes each week. The exercise should increase your heart rate and make you sweat (moderate-intensity exercise).  Most adults should also do strengthening exercises at least twice a week. This is in addition to the moderate-intensity exercise.  Maintain a healthy weight  Body mass index (BMI) is a measurement that can be used to identify possible weight problems. It estimates body fat based on height and weight. Your health care provider can help determine your BMI and help you achieve or maintain a healthy weight.  For females 72 years of age and older:   A BMI below 18.5 is considered underweight.  A BMI of 18.5 to 24.9 is normal.  A BMI of 25 to 29.9 is considered overweight.  A BMI of 30 and above is considered obese.  Watch levels of cholesterol and blood lipids  You should start having your blood tested for lipids and cholesterol at 72 years of age, then have this test every 5 years.  You may need to have your cholesterol levels checked more often if:  Your lipid or cholesterol levels are high.  You are older than 72 years of age.  You are at high risk for heart disease.  CANCER SCREENING   Lung Cancer  Lung cancer screening is recommended for adults 72-75 years old who are at high risk for lung cancer because of a history of smoking.  A yearly low-dose CT scan of the  lungs is recommended for people who:  Currently smoke.  Have quit within the past 15 years.  Have at least a 30-pack-year history of smoking. A pack year is smoking an average of one pack of cigarettes a day for 1 year.  Yearly screening should continue until it has been 15 years since you quit.  Yearly screening should stop if you develop a health problem that would prevent you from having lung cancer treatment.  Breast Cancer  Practice breast self-awareness. This means understanding how your breasts normally appear and feel.  It also means doing regular breast self-exams. Let your health care provider know about any changes, no matter how small.  If you are in your 72s or 30s, you should have a clinical breast exam (CBE) by a health care provider every 1-3 years as part of a regular health exam.  If you are 72 or older, have a CBE every year. Also consider having a breast X-ray (mammogram) every year.  If you have a family history of breast cancer, talk to your health care provider about genetic screening.  If you are at high risk for breast cancer, talk to your health care provider about having an MRI and a mammogram every year.  Breast cancer gene (BRCA) assessment is recommended for women who have family members with BRCA-related cancers. BRCA-related cancers include:  Breast.  Ovarian.  Tubal.  Peritoneal cancers.  Results of the assessment will determine the need for genetic counseling and BRCA1 and BRCA2 testing. Cervical Cancer Your health care provider may recommend that you be screened regularly for cancer of the pelvic organs (ovaries, uterus, and vagina). This screening involves a pelvic examination, including checking for microscopic changes to the surface of your cervix (Pap test). You may be encouraged to have this screening done every 3 years, beginning at age 72.  For women ages 27-65, health care providers may recommend pelvic exams and Pap testing every 3  years, or they may recommend the Pap and pelvic exam, combined with testing for human papilloma virus (HPV), every 5 years. Some types of HPV increase your risk of cervical cancer. Testing for HPV may also be done on women of any age with unclear Pap test results.  Other health care providers may not recommend any screening for nonpregnant women who are considered low risk for pelvic cancer and who do not have symptoms. Ask your health care provider if a screening pelvic exam is right for you.  If you have had past treatment for cervical cancer or a condition that could lead to cancer, you need Pap tests and screening for cancer for at least 20 years after your treatment. If Pap tests have been discontinued, your risk factors (such as having a new sexual partner) need to be reassessed to determine if screening should resume. Some women have medical problems that increase the chance of getting cervical cancer. In  these cases, your health care provider may recommend more frequent screening and Pap tests. Colorectal Cancer  This type of cancer can be detected and often prevented.  Routine colorectal cancer screening usually begins at 72 years of age and continues through 72 years of age.  Your health care provider may recommend screening at an earlier age if you have risk factors for colon cancer.  Your health care provider may also recommend using home test kits to check for hidden blood in the stool.  A small camera at the end of a tube can be used to examine your colon directly (sigmoidoscopy or colonoscopy). This is done to check for the earliest forms of colorectal cancer.  Routine screening usually begins at age 72.  Direct examination of the colon should be repeated every 5-10 years through 72 years of age. However, you may need to be screened more often if early forms of precancerous polyps or small growths are found. Skin Cancer  Check your skin from head to toe regularly.  Tell your  health care provider about any new moles or changes in moles, especially if there is a change in a mole's shape or color.  Also tell your health care provider if you have a mole that is larger than the size of a pencil eraser.  Always use sunscreen. Apply sunscreen liberally and repeatedly throughout the day.  Protect yourself by wearing long sleeves, pants, a wide-brimmed hat, and sunglasses whenever you are outside. HEART DISEASE, DIABETES, AND HIGH BLOOD PRESSURE   High blood pressure causes heart disease and increases the risk of stroke. High blood pressure is more likely to develop in:  People who have blood pressure in the high end of the normal range (130-139/85-89 mm Hg).  People who are overweight or obese.  People who are African American.  If you are 19-82 years of age, have your blood pressure checked every 3-5 years. If you are 59 years of age or older, have your blood pressure checked every year. You should have your blood pressure measured twice--once when you are at a hospital or clinic, and once when you are not at a hospital or clinic. Record the average of the two measurements. To check your blood pressure when you are not at a hospital or clinic, you can use:  An automated blood pressure machine at a pharmacy.  A home blood pressure monitor.  If you are between 12 years and 82 years old, ask your health care provider if you should take aspirin to prevent strokes.  Have regular diabetes screenings. This involves taking a blood sample to check your fasting blood sugar level.  If you are at a normal weight and have a low risk for diabetes, have this test once every three years after 72 years of age.  If you are overweight and have a high risk for diabetes, consider being tested at a younger age or more often. PREVENTING INFECTION  Hepatitis B  If you have a higher risk for hepatitis B, you should be screened for this virus. You are considered at high risk for  hepatitis B if:  You were born in a country where hepatitis B is common. Ask your health care provider which countries are considered high risk.  Your parents were born in a high-risk country, and you have not been immunized against hepatitis B (hepatitis B vaccine).  You have HIV or AIDS.  You use needles to inject street drugs.  You live with someone who has  hepatitis B.  You have had sex with someone who has hepatitis B.  You get hemodialysis treatment.  You take certain medicines for conditions, including cancer, organ transplantation, and autoimmune conditions. Hepatitis C  Blood testing is recommended for:  Everyone born from [birth year] through 1967.  Anyone with known risk factors for hepatitis C. Sexually transmitted infections (STIs)  You should be screened for sexually transmitted infections (STIs) including gonorrhea and chlamydia if:  You are sexually active and are younger than 72 years of age.  You are older than 72 years of age and your health care provider tells you that you are at risk for this type of infection.  Your sexual activity has changed since you were last screened and you are at an increased risk for chlamydia or gonorrhea. Ask your health care provider if you are at risk.  If you do not have HIV, but are at risk, it may be recommended that you take a prescription medicine daily to prevent HIV infection. This is called pre-exposure prophylaxis (PrEP). You are considered at risk if:  You are sexually active and do not regularly use condoms or know the HIV status of your partner(s).  You take drugs by injection.  You are sexually active with a partner who has HIV. Talk with your health care provider about whether you are at high risk of being infected with HIV. If you choose to begin PrEP, you should first be tested for HIV. You should then be tested every 3 months for as long as you are taking PrEP.  PREGNANCY   If you are premenopausal and you may  become pregnant, ask your health care provider about preconception counseling.  If you may become pregnant, take 400 to 800 micrograms (mcg) of folic acid every day.  If you want to prevent pregnancy, talk to your health care provider about birth control (contraception). OSTEOPOROSIS AND MENOPAUSE   Osteoporosis is a disease in which the bones lose minerals and strength with aging. This can result in serious bone fractures. Your risk for osteoporosis can be identified using a bone density scan.  If you are 69 years of age or older, or if you are at risk for osteoporosis and fractures, ask your health care provider if you should be screened.  Ask your health care provider whether you should take a calcium or vitamin D supplement to lower your risk for osteoporosis.  Menopause may have certain physical symptoms and risks.  Hormone replacement therapy may reduce some of these symptoms and risks. Talk to your health care provider about whether hormone replacement therapy is right for you.  HOME CARE INSTRUCTIONS   Schedule regular health, dental, and eye exams.  Stay current with your immunizations.   Do not use any tobacco products including cigarettes, chewing tobacco, or electronic cigarettes.  If you are pregnant, do not drink alcohol.  If you are breastfeeding, limit how much and how often you drink alcohol.  Limit alcohol intake to no more than 1 drink per day for nonpregnant women. One drink equals 12 ounces of beer, 5 ounces of wine, or 1 ounces of hard liquor.  Do not use street drugs.  Do not share needles.  Ask your health care provider for help if you need support or information about quitting drugs.  Tell your health care provider if you often feel depressed.  Tell your health care provider if you have ever been abused or do not feel safe at home.   This information  is not intended to replace advice given to you by your health care provider. Make sure you discuss  any questions you have with your health care provider.   Document Released: 01/14/2011 Document Revised: 07/22/2014 Document Reviewed: 06/02/2013 Elsevier Interactive Patient Education [employer].

## 2016-04-18 NOTE — Assessment & Plan Note (Signed)
Controlled Takes omeprazole prn only Continue same

## 2016-04-18 NOTE — Assessment & Plan Note (Signed)
dexa up to date Stressed exercise Taking cal/vit d

## 2016-04-18 NOTE — Progress Notes (Signed)
Pre visit review using our clinic review tool, if applicable. No additional management support is needed unless otherwise documented below in the visit note. 

## 2016-04-18 NOTE — Assessment & Plan Note (Signed)
Mild, occasional Does not bother her enough to consider medication

## 2016-04-18 NOTE — Assessment & Plan Note (Signed)
No evidence of recurrence 

## 2016-04-18 NOTE — Assessment & Plan Note (Signed)
Sees Dr Lomax annually 

## 2016-04-18 NOTE — Assessment & Plan Note (Signed)
Taking simvastatin Check lipids

## 2016-04-19 LAB — HEPATITIS C ANTIBODY: HCV AB: NEGATIVE

## 2016-04-29 ENCOUNTER — Encounter: Payer: Self-pay | Admitting: Internal Medicine

## 2016-04-30 ENCOUNTER — Encounter: Payer: Self-pay | Admitting: Internal Medicine

## 2016-05-15 DIAGNOSIS — D1801 Hemangioma of skin and subcutaneous tissue: Secondary | ICD-10-CM | POA: Diagnosis not present

## 2016-05-15 DIAGNOSIS — L821 Other seborrheic keratosis: Secondary | ICD-10-CM | POA: Diagnosis not present

## 2016-05-15 DIAGNOSIS — B351 Tinea unguium: Secondary | ICD-10-CM | POA: Diagnosis not present

## 2016-05-15 DIAGNOSIS — Z85828 Personal history of other malignant neoplasm of skin: Secondary | ICD-10-CM | POA: Diagnosis not present

## 2016-05-15 DIAGNOSIS — D225 Melanocytic nevi of trunk: Secondary | ICD-10-CM | POA: Diagnosis not present

## 2016-05-15 DIAGNOSIS — L820 Inflamed seborrheic keratosis: Secondary | ICD-10-CM | POA: Diagnosis not present

## 2016-05-15 DIAGNOSIS — L570 Actinic keratosis: Secondary | ICD-10-CM | POA: Diagnosis not present

## 2016-06-12 ENCOUNTER — Ambulatory Visit (AMBULATORY_SURGERY_CENTER): Payer: Self-pay | Admitting: *Deleted

## 2016-06-12 VITALS — Ht 65.0 in | Wt 244.0 lb

## 2016-06-12 DIAGNOSIS — Z1211 Encounter for screening for malignant neoplasm of colon: Secondary | ICD-10-CM

## 2016-06-12 MED ORDER — NA SULFATE-K SULFATE-MG SULF 17.5-3.13-1.6 GM/177ML PO SOLN: ORAL | 0 refills | Status: DC

## 2016-06-12 NOTE — Progress Notes (Signed)
Patient denies any allergies to eggs or soy. Patient had nausea with anesthesia/sedation. Patient denies any oxygen use at home and does not take any diet/weight loss medications. EMMI education assisgned to patient on colonoscopy, this was explained and instructions given to patient.

## 2016-06-13 ENCOUNTER — Encounter: Payer: Self-pay | Admitting: Internal Medicine

## 2016-06-13 DIAGNOSIS — B351 Tinea unguium: Secondary | ICD-10-CM | POA: Diagnosis not present

## 2016-06-13 DIAGNOSIS — L603 Nail dystrophy: Secondary | ICD-10-CM | POA: Diagnosis not present

## 2016-06-13 DIAGNOSIS — Z09 Encounter for follow-up examination after completed treatment for conditions other than malignant neoplasm: Secondary | ICD-10-CM | POA: Diagnosis not present

## 2016-06-13 DIAGNOSIS — L609 Nail disorder, unspecified: Secondary | ICD-10-CM | POA: Diagnosis not present

## 2016-06-26 ENCOUNTER — Encounter: Payer: Self-pay | Admitting: Internal Medicine

## 2016-06-26 ENCOUNTER — Ambulatory Visit (AMBULATORY_SURGERY_CENTER): Payer: Commercial Managed Care - HMO | Admitting: Internal Medicine

## 2016-06-26 VITALS — BP 154/73 | HR 71 | Temp 96.9°F | Resp 13 | Ht 64.0 in | Wt 236.0 lb

## 2016-06-26 DIAGNOSIS — Z1212 Encounter for screening for malignant neoplasm of rectum: Secondary | ICD-10-CM

## 2016-06-26 DIAGNOSIS — D122 Benign neoplasm of ascending colon: Secondary | ICD-10-CM | POA: Diagnosis not present

## 2016-06-26 DIAGNOSIS — Z1211 Encounter for screening for malignant neoplasm of colon: Secondary | ICD-10-CM | POA: Diagnosis present

## 2016-06-26 DIAGNOSIS — D123 Benign neoplasm of transverse colon: Secondary | ICD-10-CM | POA: Diagnosis not present

## 2016-06-26 MED ORDER — SODIUM CHLORIDE 0.9 % IV SOLN: 500.0000 mL | INTRAVENOUS | Status: DC

## 2016-06-26 NOTE — Patient Instructions (Signed)
YOU HAD AN ENDOSCOPIC PROCEDURE TODAY AT THE [facility]:   Refer to the procedure report that was given to you for any specific questions about what was found during the examination.  If the procedure report does not answer your questions, please call your gastroenterologist to clarify.  If you requested that your care partner not be given the details of your procedure findings, then the procedure report has been included in a sealed envelope for you to review at your convenience later.  YOU SHOULD EXPECT: Some feelings of bloating in the abdomen. Passage of more gas than usual.  Walking can help get rid of the air that was put into your GI tract during the procedure and reduce the bloating. If you had a lower endoscopy (such as a colonoscopy or flexible sigmoidoscopy) you may notice spotting of blood in your stool or on the toilet paper. If you underwent a bowel prep for your procedure, you may not have a normal bowel movement for a few days.  Please Note:  You might notice some irritation and congestion in your nose or some drainage.  This is from the oxygen used during your procedure.  There is no need for concern and it should clear up in a day or so.  SYMPTOMS TO REPORT IMMEDIATELY:   Following lower endoscopy (colonoscopy or flexible sigmoidoscopy):  Excessive amounts of blood in the stool  Significant tenderness or worsening of abdominal pains  Swelling of the abdomen that is new, acute  Fever of 100F or higher   For urgent or emergent issues, a gastroenterologist can be reached at any hour by calling [phone]. Please read all handouts given to you by your recovery nurse.   DIET:  We do recommend a small meal at first, but then you may proceed to your regular diet.  Drink plenty of fluids but you should avoid alcoholic beverages for 24 hours.  ACTIVITY:  You should plan to take it easy for the rest of today and you should NOT DRIVE or use heavy machinery until  tomorrow (because of the sedation medicines used during the test).    FOLLOW UP: Our staff will call the number listed on your records the next business day following your procedure to check on you and address any questions or concerns that you may have regarding the information given to you following your procedure. If we do not reach you, we will leave a message.  However, if you are feeling well and you are not experiencing any problems, there is no need to return our call.  We will assume that you have returned to your regular daily activities without incident.  If any biopsies were taken you will be contacted by phone or by letter within the next 1-3 weeks.  Please call us at [phone] if you have not heard about the biopsies in 3 weeks.    SIGNATURES/CONFIDENTIALITY: You and/or your care partner have signed paperwork which will be entered into your electronic medical record.  These signatures attest to the fact that that the information above on your After Visit Summary has been reviewed and is understood.  Full responsibility of the confidentiality of this discharge information lies with you and/or your care-partner.  Thank you for letting us take care of your healthcare needs today. 

## 2016-06-26 NOTE — Progress Notes (Signed)
Called to room to assist during endoscopic procedure.  Patient ID and intended procedure confirmed with present staff. Received instructions for my participation in the procedure from the performing physician.  

## 2016-06-26 NOTE — Op Note (Signed)
[place] Patient Name: Tanya [name] Procedure Date: 06/26/2016 11:48 AM MRN: [id number] Endoscopist: Jerene Bears , MD Age: 70 Referring MD:  Date of Birth: [birth year] Gender: Female Account #: [id number] Procedure:                Colonoscopy Indications:              Screening for colorectal malignant neoplasm, Last                            colonoscopy 10 years ago Medicines:                Monitored Anesthesia Care Procedure:                Pre-Anesthesia Assessment:                           - Prior to the procedure, a History and Physical                            was performed, and patient medications and                            allergies were reviewed. The patient's tolerance of                            previous anesthesia was also reviewed. The risks                            and benefits of the procedure and the sedation                            options and risks were discussed with the patient.                            All questions were answered, and informed consent                            was obtained. Prior Anticoagulants: The patient has                            taken no previous anticoagulant or antiplatelet                            agents. ASA Grade Assessment: III - A patient with                            severe systemic disease. After reviewing the risks                            and benefits, the patient was deemed in                            satisfactory condition to undergo the procedure.  After obtaining informed consent, the colonoscope                            was passed under direct vision. Throughout the                            procedure, the patient's blood pressure, pulse, and                            oxygen saturations were monitored continuously. The                            Model PCF-H190L [id number]) scope was introduced                            through the anus and advanced to  the the cecum,                            identified by appendiceal orifice and ileocecal                            valve. The colonoscopy was performed without                            difficulty. The patient tolerated the procedure                            well. The quality of the bowel preparation was                            good. The ileocecal valve, appendiceal orifice, and                            rectum were photographed. Scope In: 12:08:33 PM Scope Out: 12:28:47 PM Scope Withdrawal Time: 0 hours 13 minutes 9 seconds  Total Procedure Duration: 0 hours 20 minutes 14 seconds  Findings:                 The perianal and digital rectal examinations were                            normal.                           A 5 mm polyp was found in the ascending colon. The                            polyp was sessile. The polyp was removed with a                            cold snare. Resection and retrieval were complete.                           A 6 mm polyp was found in the hepatic flexure. The  polyp was sessile. The polyp was removed with a                            cold snare. Resection and retrieval were complete.                           A 7 mm polyp was found in the transverse colon. The                            polyp was flat. The polyp was removed with a cold                            snare. Resection and retrieval were complete.                           Multiple small and large-mouthed diverticula were                            found in the sigmoid colon and descending colon.                           Internal hemorrhoids were found during                            retroflexion. The hemorrhoids were small. Complications:            No immediate complications. Estimated Blood Loss:     Estimated blood loss was minimal. Impression:               - One 5 mm polyp in the ascending colon, removed                            with a cold snare.  Resected and retrieved.                           - One 6 mm polyp at the hepatic flexure, removed                            with a cold snare. Resected and retrieved.                           - One 7 mm polyp in the transverse colon, removed                            with a cold snare. Resected and retrieved.                           - Moderate diverticulosis in the sigmoid colon and                            in the descending colon.                           - Internal  hemorrhoids. Recommendation:           - Patient has a contact number available for                            emergencies. The signs and symptoms of potential                            delayed complications were discussed with the                            patient. Return to normal activities tomorrow.                            Written discharge instructions were provided to the                            patient.                           - Resume previous diet.                           - Continue present medications.                           - Await pathology results.                           - Repeat colonoscopy is recommended. The                            colonoscopy date will be determined after pathology                            results from today's exam become available for                            review. Jerene Bears, MD 06/26/2016 12:32:09 PM This report has been signed electronically.

## 2016-06-26 NOTE — Progress Notes (Signed)
A/ox3 pleased with MAC, report to Annette RN 

## 2016-06-27 ENCOUNTER — Telehealth: Payer: Self-pay

## 2016-06-27 NOTE — Telephone Encounter (Signed)
  Follow up Call-  Call back number 06/26/2016  Post procedure Call Back phone  # [phone] [facility] [employer] [facility]  Permission to leave phone message Yes  Some recent data might be hidden    Patient was called for follow up after her procedure on 06/26/2016. I spoke with Eddie Dibbles and he reports that Tanya [name] has returned to her normal daily activities without any complications.

## 2016-07-05 ENCOUNTER — Encounter: Payer: Self-pay | Admitting: Internal Medicine

## 2016-08-08 DIAGNOSIS — L988 Other specified disorders of the skin and subcutaneous tissue: Secondary | ICD-10-CM | POA: Diagnosis not present

## 2016-08-08 DIAGNOSIS — L570 Actinic keratosis: Secondary | ICD-10-CM | POA: Diagnosis not present

## 2016-08-08 DIAGNOSIS — L603 Nail dystrophy: Secondary | ICD-10-CM | POA: Diagnosis not present

## 2016-08-08 DIAGNOSIS — L814 Other melanin hyperpigmentation: Secondary | ICD-10-CM | POA: Diagnosis not present

## 2016-08-08 DIAGNOSIS — D485 Neoplasm of uncertain behavior of skin: Secondary | ICD-10-CM | POA: Diagnosis not present

## 2016-08-08 DIAGNOSIS — L820 Inflamed seborrheic keratosis: Secondary | ICD-10-CM | POA: Diagnosis not present

## 2016-08-08 DIAGNOSIS — Z85828 Personal history of other malignant neoplasm of skin: Secondary | ICD-10-CM | POA: Diagnosis not present

## 2016-11-06 DIAGNOSIS — L570 Actinic keratosis: Secondary | ICD-10-CM | POA: Diagnosis not present

## 2016-11-06 DIAGNOSIS — Z85828 Personal history of other malignant neoplasm of skin: Secondary | ICD-10-CM | POA: Diagnosis not present

## 2016-12-28 ENCOUNTER — Encounter: Payer: Self-pay | Admitting: Family Medicine

## 2016-12-28 ENCOUNTER — Ambulatory Visit (INDEPENDENT_AMBULATORY_CARE_PROVIDER_SITE_OTHER): Payer: Medicare HMO | Admitting: Family Medicine

## 2016-12-28 VITALS — BP 130/82 | HR 85 | Temp 97.6°F | Ht 64.0 in | Wt 253.0 lb

## 2016-12-28 DIAGNOSIS — J301 Allergic rhinitis due to pollen: Secondary | ICD-10-CM

## 2016-12-28 DIAGNOSIS — L2389 Allergic contact dermatitis due to other agents: Secondary | ICD-10-CM

## 2016-12-28 MED ORDER — PREDNISONE 20 MG PO TABS: ORAL_TABLET | ORAL | 0 refills | Status: DC

## 2016-12-28 MED ORDER — CETIRIZINE HCL 10 MG PO CAPS: 10.0000 mg | ORAL_CAPSULE | Freq: Every day | ORAL | 3 refills | Status: DC

## 2016-12-28 NOTE — Patient Instructions (Addendum)
Allergic reaction to eyebrow tinting  Can take benadryl 25mg  every 6-8 hours. I would take a dose this morning and then before bed this evening.   Start prednisone. We discussed your prior reaction of feeling "wired" on this years ago so we opted to try a lower dose of medication. We considered topicals but considering how sensitive your skin is we opted out.   If new or worsening symptoms return to care. Particularly if you have expanding redness from the site of presumed allergic reaction (does not look bacterial at present but its always possible you could get a bacterial infection.   Also sent in zyrtec and hopefully insurance will cover it the way we sent it in

## 2016-12-28 NOTE — Progress Notes (Addendum)
Subjective:  Tanya [name] is a 71 y.o. year old very pleasant female patient who presents for/with See problem oriented charting ROS- no lip or tongue swelling. No shortness of breath. No new medications recently   Past Medical History-  Patient Active Problem List   Diagnosis Date Noted  . Left ear pain 08/29/2015  . Rhinitis, chronic 08/29/2015  . 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/29/2015  . GERD (gastroesophageal reflux disease) 12/14/2013  . Essential tremor 12/14/2013  . Osteopenia 06/11/2013  . OA (osteoarthritis) of knee 02/24/2012  . FASTING HYPERGLYCEMIA 04/06/2009  . NONTOXIC MULTINODULAR GOITER 03/03/2008  . Hyperlipidemia 12/02/2007  . ALLERGIC RHINITIS 12/02/2007  . Personal history of malignant neoplasm of breast 12/02/2007  . History of skin cancer 12/02/2007  . Vitamin D deficiency 12/02/2007    Medications- reviewed and updated Current Outpatient Prescriptions  Medication Sig Dispense Refill  . aspirin EC 81 MG tablet Take 81 mg by mouth daily.    Marland Kitchen azelastine (ASTELIN) 0.1 % nasal spray Place into both nostrils 2 (two) times daily. Use in each nostril as directed    . Calcium Carbonate-Vitamin D (CALCIUM 600 + D PO) Take by mouth 2 (two) times daily.    . Cetirizine HCl (KLS ALLER-TEC PO) Take by mouth daily.    . cholecalciferol (VITAMIN D) 1000 UNITS tablet Take 2,000 Units by mouth daily.    Marland Kitchen FLUTICASONE PROPIONATE NA Place into the nose daily as needed.    Marland Kitchen ipratropium (ATROVENT) 0.06 % nasal spray Place 2 sprays into both nostrils 4 (four) times daily.    . Multiple Vitamin (MULTIVITAMIN) tablet Take 1 tablet by mouth daily.    Marland Kitchen omeprazole (PRILOSEC) 20 MG capsule Take 1 capsule (20 mg total) by mouth daily. (Patient not taking: Reported on 06/26/2016) 90 capsule 3  . simvastatin (ZOCOR) 20 MG tablet TAKE 1 TABLET IN THE EVENING 90 tablet 3   Current Facility-Administered Medications  Medication Dose Route Frequency Provider Last Rate Last Dose  . 0.9 %  sodium  chloride infusion  500 mL Intravenous Continuous Pyrtle, Lajuan Lines, MD        Objective: BP 130/82 (BP Location: Left Arm, Patient Position: Sitting, Cuff Size: Large)   Pulse 85   Temp 97.6 F (36.4 C) (Oral)   Ht 5\' 4"  (1.626 m)   Wt 253 lb (114.8 kg)   SpO2 100%   BMI 43.43 kg/m  Gen: NAD, resting comfortably Bilateral eyebrows with mild erythema and swelling. PERRLA. Conjunctiva normal. No other rash on the face. No lip or tongue sweling and oropharynx normal CV: RRR no murmurs rubs or gallops Lungs: CTAB no crackles, wheeze, rhonchi.  Ext: no edema Skin: warm, dry  Assessment/Plan:  Allergic contact dermatitis due to other agents S: Tuesday had her eyebrows tinted (first time). She noted some irritation around the eyebrows.  Paitent is concerned about allergic reaction in her eyebrows. She states eyes swollen shut in AM and had to use ice pack to help calm this down. She did take her zyrtec last night. Has not tried benadryl. Seems to be getting slightly worse each day. Last a1c not elevated at 5.6.   Very sensitive skin at baseline. Wants to avoid topicals. Has had reactions to other topicals in the past.   Not on ace-I.  A/P: 45 F with hyperlipidemia, essential tremor, seasaonal allergies on zyrtec astelin and flonase, GERD presents with likely... Patient Instructions  Allergic reaction to eyebrow tinting  Can take benadryl 25mg  every 6-8 hours. I  would take a dose this morning and then before bed this evening.   Start prednisone. We discussed your prior reaction of feeling "wired" on this years ago so we opted to try a lower dose of medication. We considered topicals but considering how sensitive your skin is we opted out.   If new or worsening symptoms return to care. Particularly if you have expanding redness from the site of presumed allergic reaction (does not look bacterial at present but its always possible you could get a bacterial infection.   Allergic rhinitis- asks  for prescription for zyrtec as insurance will cover it. States symtoms have been well controlled on this medication. May also help some with current allergic reaction.   Meds ordered this encounter  Medications  . Cetirizine HCl (ZYRTEC ALLERGY) 10 MG CAPS    Sig: Take 1 capsule (10 mg total) by mouth daily.    Dispense:  90 capsule    Refill:  3  . predniSONE (DELTASONE) 20 MG tablet    Sig: Take 1 tablet by mouth daily for 5 days, then 1/2 tablet daily for 2 days    Dispense:  6 tablet    Refill:  0    Return precautions advised.  Garret Reddish, MD

## 2017-01-23 DIAGNOSIS — M7062 Trochanteric bursitis, left hip: Secondary | ICD-10-CM | POA: Diagnosis not present

## 2017-03-31 DIAGNOSIS — H5212 Myopia, left eye: Secondary | ICD-10-CM | POA: Diagnosis not present

## 2017-04-21 ENCOUNTER — Telehealth: Payer: Self-pay | Admitting: Internal Medicine

## 2017-04-21 ENCOUNTER — Encounter: Payer: Self-pay | Admitting: Internal Medicine

## 2017-04-21 ENCOUNTER — Ambulatory Visit (INDEPENDENT_AMBULATORY_CARE_PROVIDER_SITE_OTHER): Payer: Medicare HMO | Admitting: Internal Medicine

## 2017-04-21 VITALS — BP 122/80 | HR 89 | Temp 98.2°F | Resp 16 | Wt 250.0 lb

## 2017-04-21 DIAGNOSIS — B029 Zoster without complications: Secondary | ICD-10-CM | POA: Diagnosis not present

## 2017-04-21 MED ORDER — GABAPENTIN 100 MG PO CAPS: 100.0000 mg | ORAL_CAPSULE | Freq: Every day | ORAL | 1 refills | Status: DC

## 2017-04-21 NOTE — Progress Notes (Signed)
Subjective:    Patient ID: Tanya [name], female    DOB: [birth year], 71 y.o.   MRN: [id number]  HPI She is here for an acute visit.   Pain right side of back:  It started two weeks ago. She has never had back problems.  It started in the right lower back and moved to the right middle back and radiates around to below the right breast.  The pain is intermittent and can be intense at times reaching a 7/10.  Today the pain is 4/10.   Sitting up and slouching makes the pain worse.  Turning over in bed makes it worse. Sometimes lying flat will decrease the pain.  Ice helps a little. She has been taking tylenol ES or her husband's tramadol and they do not help.    She has been on a keto diet x 2 weeks.  She denies urinary symptoms and has never had kidney stones.   Medications and allergies reviewed with patient and updated if appropriate.  Patient Active Problem List   Diagnosis Date Noted  . Left ear pain 08/29/2015  . Rhinitis, chronic 08/29/2015  . 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/29/2015  . GERD (gastroesophageal reflux disease) 12/14/2013  . Essential tremor 12/14/2013  . Osteopenia 06/11/2013  . OA (osteoarthritis) of knee 02/24/2012  . FASTING HYPERGLYCEMIA 04/06/2009  . NONTOXIC MULTINODULAR GOITER 03/03/2008  . Hyperlipidemia 12/02/2007  . ALLERGIC RHINITIS 12/02/2007  . Personal history of malignant neoplasm of breast 12/02/2007  . History of skin cancer 12/02/2007  . Vitamin D deficiency 12/02/2007    Current Outpatient Prescriptions on File Prior to Visit  Medication Sig Dispense Refill  . aspirin EC 81 MG tablet Take 81 mg by mouth daily.    Marland Kitchen azelastine (ASTELIN) 0.1 % nasal spray Place into both nostrils 2 (two) times daily. Use in each nostril as directed    . Calcium Carbonate-Vitamin D (CALCIUM 600 + D PO) Take by mouth 2 (two) times daily.    . Cetirizine HCl (ZYRTEC ALLERGY) 10 MG CAPS Take 1 capsule (10 mg total) by mouth daily. 90 capsule 3  . cholecalciferol (VITAMIN  D) 1000 UNITS tablet Take 2,000 Units by mouth daily.    Marland Kitchen FLUTICASONE PROPIONATE NA Place into the nose daily as needed.    Marland Kitchen ipratropium (ATROVENT) 0.06 % nasal spray Place 2 sprays into both nostrils 4 (four) times daily.    . Multiple Vitamin (MULTIVITAMIN) tablet Take 1 tablet by mouth daily.    . simvastatin (ZOCOR) 20 MG tablet TAKE 1 TABLET IN THE EVENING 90 tablet 3  . omeprazole (PRILOSEC) 20 MG capsule Take 1 capsule (20 mg total) by mouth daily. (Patient not taking: Reported on 04/21/2017) 90 capsule 3   No current facility-administered medications on file prior to visit.     Past Medical History:  Diagnosis Date  . Arthritis   . Bursitis 2014   Left; Dr Maureen Ralphs  . Cancer ([place]) 1986   breast cancer L  . Hyperlipidemia   . Lymphedema 2000   LUE  . perennial allergies   . PONV (postoperative nausea and vomiting)   . Skin cancer    basil cell    Past Surgical History:  Procedure Laterality Date  . ABDOMINAL HYSTERECTOMY  1990   & BSO for fibroids  . ANKLE FRACTURE SURGERY  2006  . BREAST RECONSTRUCTION  1986  . BREAST SURGERY  1986   double mastectonmy fror cancer on L  . COLONOSCOPY  04/14/2006  Tics ; Dr Earlean Shawl  . KNEE ARTHROSCOPY  2001 & 2011   L & R  . TOTAL KNEE ARTHROPLASTY  02/24/2012   Procedure: TOTAL KNEE ARTHROPLASTY;  Surgeon: Gearlean Alf, MD;  Location: [facility];  Service: Orthopedics;  Laterality: Right;  . WISDOM TOOTH EXTRACTION      Social History   Social History  . Marital status: Married    Spouse name: N/A  . Number of children: N/A  . Years of education: N/A   Social History Main Topics  . Smoking status: Never Smoker  . Smokeless tobacco: Never Used  . Alcohol use Yes     Comment:  very rarely, 1-2 per month  . Drug use: No  . Sexual activity: Not on file   Other Topics Concern  . Not on file   Social History Narrative  . No narrative on file    Family History  Problem Relation Age of Onset  . Hypertension Mother   .  Heart disease Mother        pacer  . Colon polyps Mother        pre cancerous  . Glaucoma Mother   . Heart attack Maternal Grandmother 94  . Hypertension Maternal Grandmother   . Stroke Maternal Grandfather 82  . Hypertension Maternal Grandfather   . Heart attack Maternal Uncle 65  . Cancer Neg Hx   . Colon cancer Neg Hx   . Esophageal cancer Neg Hx   . Rectal cancer Neg Hx   . Stomach cancer Neg Hx   . Pancreatic cancer Neg Hx     Review of Systems  Constitutional: Negative for chills and fever.  Gastrointestinal: Negative for abdominal pain, blood in stool, constipation, diarrhea and nausea.  Genitourinary: Negative for dysuria, frequency and hematuria.       Objective:   Vitals:   04/21/17 1046  BP: 122/80  Pulse: 89  Resp: 16  Temp: 98.2 F (36.8 C)  SpO2: 97%   Filed Weights   04/21/17 1046  Weight: 250 lb (113.4 kg)   Body mass index is 42.91 kg/m.  Wt Readings from Last 3 Encounters:  04/21/17 250 lb (113.4 kg)  12/28/16 253 lb (114.8 kg)  06/26/16 236 lb (107 kg)     Physical Exam  Constitutional: She appears well-developed and well-nourished. No distress.  HENT:  Head: Normocephalic and atraumatic.  Skin: Skin is warm and dry. Rash (right mid back and anterior right mid thorax - clusters of blisters and papules w/o surrouding erythema) noted. She is not diaphoretic.          Assessment & Plan:   See Problem List for Assessment and Plan of chronic medical problems.

## 2017-04-21 NOTE — Assessment & Plan Note (Signed)
Resolving shingles rash on right mid back and right anterior mid thorax - no evidence of infection Will start gabapentin at night - will titrate if tolerated Tylenol as needed Call if any questions or concerns or if pain is not improving Will get new shingles vaccine once this episode as completely resolved.

## 2017-04-21 NOTE — Telephone Encounter (Addendum)
[id number] - [place], [address] [place] [phone] (Phone) [phone] (Fax)   Patient is requesting a cream to be called into the pharmacy for the Shingles. Please advise. Thank you.

## 2017-04-21 NOTE — Patient Instructions (Addendum)
Take gabapentin 100 mg at bedtime - we can increase this dose if tolerated.        Shingles Shingles, which is also known as herpes zoster, is an infection that causes a painful skin rash and fluid-filled blisters. Shingles is not related to genital herpes, which is a sexually transmitted infection. Shingles only develops in people who:  Have had chickenpox.  Have received the chickenpox vaccine. (This is rare.)  What are the causes? Shingles is caused by varicella-zoster virus (VZV). This is the same virus that causes chickenpox. After exposure to VZV, the virus stays in the body in an inactive (dormant) state. Shingles develops if the virus reactivates. This can happen many years after the initial exposure to VZV. It is not known what causes this virus to reactivate. What increases the risk? People who have had chickenpox or received the chickenpox vaccine are at risk for shingles. Infection is more common in people who:  Are older than age 48.  Have a weakened defense (immune) system, such as those with HIV, AIDS, or cancer.  Are taking medicines that weaken the immune system, such as transplant medicines.  Are under great stress.  What are the signs or symptoms? Early symptoms of this condition include itching, tingling, and pain in an area on your skin. Pain may be described as burning, stabbing, or throbbing. A few days or weeks after symptoms start, a painful red rash appears, usually on one side of the body in a bandlike or beltlike pattern. The rash eventually turns into fluid-filled blisters that break open, scab over, and dry up in about 2-3 weeks. At any time during the infection, you may also develop:  A fever.  Chills.  A headache.  An upset stomach.  How is this diagnosed? This condition is diagnosed with a skin exam. Sometimes, skin or fluid samples are taken from the blisters before a diagnosis is made. These samples are examined under a microscope or sent to  a lab for testing. How is this treated? There is no specific cure for this condition. Your health care provider will probably prescribe medicines to help you manage pain, recover more quickly, and avoid long-term problems. Medicines may include:  Antiviral drugs.  Anti-inflammatory drugs.  Pain medicines.  If the area involved is on your face, you may be referred to a specialist, such as an eye doctor (ophthalmologist) or an ear, nose, and throat (ENT) doctor to help you avoid eye problems, chronic pain, or disability. Follow these instructions at home: Medicines  Take medicines only as directed by your health care provider.  Apply an anti-itch or numbing cream to the affected area as directed by your health care provider. Blister and Rash Care  Take a cool bath or apply cool compresses to the area of the rash or blisters as directed by your health care provider. This may help with pain and itching.  Keep your rash covered with a loose bandage (dressing). Wear loose-fitting clothing to help ease the pain of material rubbing against the rash.  Keep your rash and blisters clean with mild soap and cool water or as directed by your health care provider.  Check your rash every day for signs of infection. These include redness, swelling, and pain that lasts or increases.  Do not pick your blisters.  Do not scratch your rash. General instructions  Rest as directed by your health care provider.  Keep all follow-up visits as directed by your health care provider. This is important.  Until your blisters scab over, your infection can cause chickenpox in people who have never had it or been vaccinated against it. To prevent this from happening, avoid contact with other people, especially: ? Babies. ? Pregnant women. ? Children who have eczema. ? Elderly people who have transplants. ? People who have chronic illnesses, such as leukemia or AIDS. Contact a health care provider if:  Your  pain is not relieved with prescribed medicines.  Your pain does not get better after the rash heals.  Your rash looks infected. Signs of infection include redness, swelling, and pain that lasts or increases. Get help right away if:  The rash is on your face or nose.  You have facial pain, pain around your eye area, or loss of feeling on one side of your face.  You have ear pain or you have ringing in your ear.  You have loss of taste.  Your condition gets worse. This information is not intended to replace advice given to you by your health care provider. Make sure you discuss any questions you have with your health care provider. Document Released: 07/01/2005 Document Revised: 02/25/2016 Document Reviewed: 05/12/2014 Elsevier Interactive Patient Education  2017 [employer].

## 2017-04-22 NOTE — Telephone Encounter (Signed)
Spoke with pt to inform. Pt was able to find an OTC Cream that has given her a lot of relief.

## 2017-05-12 DIAGNOSIS — R05 Cough: Secondary | ICD-10-CM | POA: Diagnosis not present

## 2017-05-12 DIAGNOSIS — B029 Zoster without complications: Secondary | ICD-10-CM | POA: Diagnosis not present

## 2017-05-12 DIAGNOSIS — R0982 Postnasal drip: Secondary | ICD-10-CM | POA: Diagnosis not present

## 2017-05-17 DIAGNOSIS — R059 Cough, unspecified: Secondary | ICD-10-CM | POA: Insufficient documentation

## 2017-05-17 DIAGNOSIS — R052 Subacute cough: Secondary | ICD-10-CM | POA: Insufficient documentation

## 2017-05-17 DIAGNOSIS — R05 Cough: Secondary | ICD-10-CM | POA: Insufficient documentation

## 2017-06-13 ENCOUNTER — Other Ambulatory Visit: Payer: Self-pay | Admitting: Emergency Medicine

## 2017-06-13 MED ORDER — GABAPENTIN 100 MG PO CAPS: 100.0000 mg | ORAL_CAPSULE | Freq: Every day | ORAL | 0 refills | Status: DC

## 2017-06-18 DIAGNOSIS — D1801 Hemangioma of skin and subcutaneous tissue: Secondary | ICD-10-CM | POA: Diagnosis not present

## 2017-06-18 DIAGNOSIS — Z85828 Personal history of other malignant neoplasm of skin: Secondary | ICD-10-CM | POA: Diagnosis not present

## 2017-06-18 DIAGNOSIS — L821 Other seborrheic keratosis: Secondary | ICD-10-CM | POA: Diagnosis not present

## 2017-06-18 DIAGNOSIS — D225 Melanocytic nevi of trunk: Secondary | ICD-10-CM | POA: Diagnosis not present

## 2017-06-18 DIAGNOSIS — L814 Other melanin hyperpigmentation: Secondary | ICD-10-CM | POA: Diagnosis not present

## 2017-06-18 DIAGNOSIS — L570 Actinic keratosis: Secondary | ICD-10-CM | POA: Diagnosis not present

## 2017-06-30 ENCOUNTER — Other Ambulatory Visit: Payer: Self-pay | Admitting: Internal Medicine

## 2017-06-30 NOTE — Telephone Encounter (Signed)
Please advise on refill, I think pt was taking for Shingles.

## 2017-07-01 DIAGNOSIS — M85851 Other specified disorders of bone density and structure, right thigh: Secondary | ICD-10-CM | POA: Diagnosis not present

## 2017-07-01 LAB — HM DEXA SCAN

## 2017-07-06 ENCOUNTER — Other Ambulatory Visit: Payer: Self-pay | Admitting: Internal Medicine

## 2017-07-06 DIAGNOSIS — E7800 Pure hypercholesterolemia, unspecified: Secondary | ICD-10-CM

## 2017-07-16 ENCOUNTER — Telehealth: Payer: Self-pay | Admitting: Internal Medicine

## 2017-07-16 NOTE — Telephone Encounter (Signed)
dexa scan shows osteopenia --    Your bone density scan showed that you have osteopenia, which is some thinning of your bones. This does increase your risk of a fracture.   To maintain or improve your bone density you should exercise regularly and make sure you are getting about 1000-2000 units of vitamin D daily. Ideally you should be getting approximately 1200 mg of calcium daily in a combination of food and supplements.   You should have another bone density scan in two years to reevaluate.

## 2017-07-17 NOTE — Telephone Encounter (Signed)
LVM for pt to return call for results.

## 2017-07-18 ENCOUNTER — Encounter: Payer: Self-pay | Admitting: Internal Medicine

## 2017-07-18 NOTE — Telephone Encounter (Signed)
Pt spoke with Aldona Bar, scheduler was was given results. Pt understod results.

## 2017-08-18 ENCOUNTER — Telehealth: Payer: Self-pay | Admitting: Internal Medicine

## 2017-08-18 NOTE — Telephone Encounter (Signed)
Attempted to call patient to schedule AWV. Patient did not answer and voicemail did not pick up. Will try to call patient at a later time. SF

## 2017-09-04 NOTE — Patient Instructions (Addendum)
Test(s) ordered today. Your results will be released to [facility] (or called to you) after review, usually within 72hours after test completion. If any changes need to be made, you will be notified at that same time.  All other Health Maintenance issues reviewed.   All recommended immunizations and age-appropriate screenings are up-to-date or discussed.  No immunizations administered today.   Medications reviewed and updated.  No changes recommended at this time.  A mammogram was ordered for [facility].     Please followup in one year for a physical / wellness    Health Maintenance, Female Adopting a healthy lifestyle and getting preventive care can go a long way to promote health and wellness. Talk with your health care provider about what schedule of regular examinations is right for you. This is a good chance for you to check in with your provider about disease prevention and staying healthy. In between checkups, there are plenty of things you can do on your own. Experts have done a lot of research about which lifestyle changes and preventive measures are most likely to keep you healthy. Ask your health care provider for more information. Weight and diet Eat a healthy diet  Be sure to include plenty of vegetables, fruits, low-fat dairy products, and lean protein.  Do not eat a lot of foods high in solid fats, added sugars, or salt.  Get regular exercise. This is one of the most important things you can do for your health. ? Most adults should exercise for at least 150 minutes each week. The exercise should increase your heart rate and make you sweat (moderate-intensity exercise). ? Most adults should also do strengthening exercises at least twice a week. This is in addition to the moderate-intensity exercise.  Maintain a healthy weight  Body mass index (BMI) is a measurement that can be used to identify possible weight problems. It estimates body fat based on height and weight. Your  health care provider can help determine your BMI and help you achieve or maintain a healthy weight.  For females 37 years of age and older: ? A BMI below 18.5 is considered underweight. ? A BMI of 18.5 to 24.9 is normal. ? A BMI of 25 to 29.9 is considered overweight. ? A BMI of 30 and above is considered obese.  Watch levels of cholesterol and blood lipids  You should start having your blood tested for lipids and cholesterol at 74 years of age, then have this test every 5 years.  You may need to have your cholesterol levels checked more often if: ? Your lipid or cholesterol levels are high. ? You are older than 74 years of age. ? You are at high risk for heart disease.  Cancer screening Lung Cancer  Lung cancer screening is recommended for adults 80-47 years old who are at high risk for lung cancer because of a history of smoking.  A yearly low-dose CT scan of the lungs is recommended for people who: ? Currently smoke. ? Have quit within the past 15 years. ? Have at least a 30-pack-year history of smoking. A pack year is smoking an average of one pack of cigarettes a day for 1 year.  Yearly screening should continue until it has been 15 years since you quit.  Yearly screening should stop if you develop a health problem that would prevent you from having lung cancer treatment.  Breast Cancer  Practice breast self-awareness. This means understanding how your breasts normally appear and feel.  It  also means doing regular breast self-exams. Let your health care provider know about any changes, no matter how small.  If you are in your 20s or 30s, you should have a clinical breast exam (CBE) by a health care provider every 1-3 years as part of a regular health exam.  If you are 67 or older, have a CBE every year. Also consider having a breast X-ray (mammogram) every year.  If you have a family history of breast cancer, talk to your health care provider about genetic  screening.  If you are at high risk for breast cancer, talk to your health care provider about having an MRI and a mammogram every year.  Breast cancer gene (BRCA) assessment is recommended for women who have family members with BRCA-related cancers. BRCA-related cancers include: ? Breast. ? Ovarian. ? Tubal. ? Peritoneal cancers.  Results of the assessment will determine the need for genetic counseling and BRCA1 and BRCA2 testing.  Cervical Cancer Your health care provider may recommend that you be screened regularly for cancer of the pelvic organs (ovaries, uterus, and vagina). This screening involves a pelvic examination, including checking for microscopic changes to the surface of your cervix (Pap test). You may be encouraged to have this screening done every 3 years, beginning at age 50.  For women ages 2-65, health care providers may recommend pelvic exams and Pap testing every 3 years, or they may recommend the Pap and pelvic exam, combined with testing for human papilloma virus (HPV), every 5 years. Some types of HPV increase your risk of cervical cancer. Testing for HPV may also be done on women of any age with unclear Pap test results.  Other health care providers may not recommend any screening for nonpregnant women who are considered low risk for pelvic cancer and who do not have symptoms. Ask your health care provider if a screening pelvic exam is right for you.  If you have had past treatment for cervical cancer or a condition that could lead to cancer, you need Pap tests and screening for cancer for at least 20 years after your treatment. If Pap tests have been discontinued, your risk factors (such as having a new sexual partner) need to be reassessed to determine if screening should resume. Some women have medical problems that increase the chance of getting cervical cancer. In these cases, your health care provider may recommend more frequent screening and Pap  tests.  Colorectal Cancer  This type of cancer can be detected and often prevented.  Routine colorectal cancer screening usually begins at 74 years of age and continues through 74 years of age.  Your health care provider may recommend screening at an earlier age if you have risk factors for colon cancer.  Your health care provider may also recommend using home test kits to check for hidden blood in the stool.  A small camera at the end of a tube can be used to examine your colon directly (sigmoidoscopy or colonoscopy). This is done to check for the earliest forms of colorectal cancer.  Routine screening usually begins at age 36.  Direct examination of the colon should be repeated every 5-10 years through 74 years of age. However, you may need to be screened more often if early forms of precancerous polyps or small growths are found.  Skin Cancer  Check your skin from head to toe regularly.  Tell your health care provider about any new moles or changes in moles, especially if there is a change  in a mole's shape or color.  Also tell your health care provider if you have a mole that is larger than the size of a pencil eraser.  Always use sunscreen. Apply sunscreen liberally and repeatedly throughout the day.  Protect yourself by wearing long sleeves, pants, a wide-brimmed hat, and sunglasses whenever you are outside.  Heart disease, diabetes, and high blood pressure  High blood pressure causes heart disease and increases the risk of stroke. High blood pressure is more likely to develop in: ? People who have blood pressure in the high end of the normal range (130-139/85-89 mm Hg). ? People who are overweight or obese. ? People who are African American.  If you are 23-73 years of age, have your blood pressure checked every 3-5 years. If you are 75 years of age or older, have your blood pressure checked every year. You should have your blood pressure measured twice-once when you are at  a hospital or clinic, and once when you are not at a hospital or clinic. Record the average of the two measurements. To check your blood pressure when you are not at a hospital or clinic, you can use: ? An automated blood pressure machine at a pharmacy. ? A home blood pressure monitor.  If you are between 10 years and 63 years old, ask your health care provider if you should take aspirin to prevent strokes.  Have regular diabetes screenings. This involves taking a blood sample to check your fasting blood sugar level. ? If you are at a normal weight and have a low risk for diabetes, have this test once every three years after 74 years of age. ? If you are overweight and have a high risk for diabetes, consider being tested at a younger age or more often. Preventing infection Hepatitis B  If you have a higher risk for hepatitis B, you should be screened for this virus. You are considered at high risk for hepatitis B if: ? You were born in a country where hepatitis B is common. Ask your health care provider which countries are considered high risk. ? Your parents were born in a high-risk country, and you have not been immunized against hepatitis B (hepatitis B vaccine). ? You have HIV or AIDS. ? You use needles to inject street drugs. ? You live with someone who has hepatitis B. ? You have had sex with someone who has hepatitis B. ? You get hemodialysis treatment. ? You take certain medicines for conditions, including cancer, organ transplantation, and autoimmune conditions.  Hepatitis C  Blood testing is recommended for: ? Everyone born from [birth year] through 1967. ? Anyone with known risk factors for hepatitis C.  Sexually transmitted infections (STIs)  You should be screened for sexually transmitted infections (STIs) including gonorrhea and chlamydia if: ? You are sexually active and are younger than 75 years of age. ? You are older than 74 years of age and your health care provider tells  you that you are at risk for this type of infection. ? Your sexual activity has changed since you were last screened and you are at an increased risk for chlamydia or gonorrhea. Ask your health care provider if you are at risk.  If you do not have HIV, but are at risk, it may be recommended that you take a prescription medicine daily to prevent HIV infection. This is called pre-exposure prophylaxis (PrEP). You are considered at risk if: ? You are sexually active and do not regularly use  condoms or know the HIV status of your partner(s). ? You take drugs by injection. ? You are sexually active with a partner who has HIV.  Talk with your health care provider about whether you are at high risk of being infected with HIV. If you choose to begin PrEP, you should first be tested for HIV. You should then be tested every 3 months for as long as you are taking PrEP. Pregnancy  If you are premenopausal and you may become pregnant, ask your health care provider about preconception counseling.  If you may become pregnant, take 400 to 800 micrograms (mcg) of folic acid every day.  If you want to prevent pregnancy, talk to your health care provider about birth control (contraception). Osteoporosis and menopause  Osteoporosis is a disease in which the bones lose minerals and strength with aging. This can result in serious bone fractures. Your risk for osteoporosis can be identified using a bone density scan.  If you are 57 years of age or older, or if you are at risk for osteoporosis and fractures, ask your health care provider if you should be screened.  Ask your health care provider whether you should take a calcium or vitamin D supplement to lower your risk for osteoporosis.  Menopause may have certain physical symptoms and risks.  Hormone replacement therapy may reduce some of these symptoms and risks. Talk to your health care provider about whether hormone replacement therapy is right for  you. Follow these instructions at home:  Schedule regular health, dental, and eye exams.  Stay current with your immunizations.  Do not use any tobacco products including cigarettes, chewing tobacco, or electronic cigarettes.  If you are pregnant, do not drink alcohol.  If you are breastfeeding, limit how much and how often you drink alcohol.  Limit alcohol intake to no more than 1 drink per day for nonpregnant women. One drink equals 12 ounces of beer, 5 ounces of wine, or 1 ounces of hard liquor.  Do not use street drugs.  Do not share needles.  Ask your health care provider for help if you need support or information about quitting drugs.  Tell your health care provider if you often feel depressed.  Tell your health care provider if you have ever been abused or do not feel safe at home. This information is not intended to replace advice given to you by your health care provider. Make sure you discuss any questions you have with your health care provider. Document Released: 01/14/2011 Document Revised: 12/07/2015 Document Reviewed: 04/04/2015 Elsevier Interactive Patient Education  [employer].

## 2017-09-04 NOTE — Progress Notes (Signed)
Subjective:    Patient ID: Tanya [name], female    DOB: [birth year], 72 y.o.   MRN: [id number]  HPI She is here for a physical exam.   She has started a keto diet with Dr Tessa Lerner.   He would like some blood work done-she brought a list that he wrote and is not sure exactly why he wanted all of these different blood tests.  Overall she feels well.  She is still working.  She is exercising some and has lost some weight.  She has no specific concerns or questions.  Medications and allergies reviewed with patient and updated if appropriate.  Patient Active Problem List   Diagnosis Date Noted  . Herpes zoster without complication 17/51/0258  . Left ear pain 08/29/2015  . Rhinitis, chronic 08/29/2015  . 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/29/2015  . GERD (gastroesophageal reflux disease) 12/14/2013  . Essential tremor 12/14/2013  . Osteopenia 06/11/2013  . OA (osteoarthritis) of knee 02/24/2012  . FASTING HYPERGLYCEMIA 04/06/2009  . NONTOXIC MULTINODULAR GOITER 03/03/2008  . Hyperlipidemia 12/02/2007  . ALLERGIC RHINITIS 12/02/2007  . Personal history of malignant neoplasm of breast 12/02/2007  . History of skin cancer 12/02/2007  . Vitamin D deficiency 12/02/2007    Current Outpatient Medications on File Prior to Visit  Medication Sig Dispense Refill  . aspirin EC 81 MG tablet Take 81 mg by mouth daily.    Marland Kitchen azelastine (ASTELIN) 0.1 % nasal spray Place into both nostrils 2 (two) times daily. Use in each nostril as directed    . Calcium Carbonate-Vitamin D (CALCIUM 600 + D PO) Take by mouth 2 (two) times daily.    . Cetirizine HCl (ZYRTEC ALLERGY) 10 MG CAPS Take 1 capsule (10 mg total) by mouth daily. 90 capsule 3  . cholecalciferol (VITAMIN D) 1000 UNITS tablet Take 2,000 Units by mouth daily.    Marland Kitchen FLUTICASONE PROPIONATE NA Place into the nose daily as needed.    Marland Kitchen ipratropium (ATROVENT) 0.06 % nasal spray Place 2 sprays into both nostrils 4 (four) times daily.    . Multiple Vitamin  (MULTIVITAMIN) tablet Take 1 tablet by mouth daily.    . simvastatin (ZOCOR) 20 MG tablet TAKE 1 TABLET IN THE EVENING 90 tablet 2   No current facility-administered medications on file prior to visit.     Past Medical History:  Diagnosis Date  . Arthritis   . Bursitis 2014   Left; Dr Maureen Ralphs  . Cancer ([place]) 1986   breast cancer L  . Hyperlipidemia   . Lymphedema 2000   LUE  . perennial allergies   . PONV (postoperative nausea and vomiting)   . Skin cancer    basil cell    Past Surgical History:  Procedure Laterality Date  . ABDOMINAL HYSTERECTOMY  1990   & BSO for fibroids  . ANKLE FRACTURE SURGERY  2006  . BREAST RECONSTRUCTION  1986  . BREAST SURGERY  1986   double mastectonmy fror cancer on L  . COLONOSCOPY  04/14/2006   Tics ; Dr Earlean Shawl  . KNEE ARTHROSCOPY  2001 & 2011   L & R  . TOTAL KNEE ARTHROPLASTY  02/24/2012   Procedure: TOTAL KNEE ARTHROPLASTY;  Surgeon: Gearlean Alf, MD;  Location: [facility];  Service: Orthopedics;  Laterality: Right;  . WISDOM TOOTH EXTRACTION      Social History   Socioeconomic History  . Marital status: Married    Spouse name: Not on file  . Number of children: Not  on file  . Years of education: Not on file  . Highest education level: Not on file  Social Needs  . Financial resource strain: Not on file  . Food insecurity - worry: Not on file  . Food insecurity - inability: Not on file  . Transportation needs - medical: Not on file  . Transportation needs - non-medical: Not on file  Occupational History  . Not on file  Tobacco Use  . Smoking status: Never Smoker  . Smokeless tobacco: Never Used  Substance and Sexual Activity  . Alcohol use: Yes    Comment:  very rarely, 1-2 per month  . Drug use: No  . Sexual activity: Not on file  Other Topics Concern  . Not on file  Social History Narrative  . Not on file    Family History  Problem Relation Age of Onset  . Hypertension Mother   . Heart disease Mother        pacer   . Colon polyps Mother        pre cancerous  . Glaucoma Mother   . Heart attack Maternal Grandmother 94  . Hypertension Maternal Grandmother   . Stroke Maternal Grandfather 15  . Hypertension Maternal Grandfather   . Heart attack Maternal Uncle 65  . Cancer Neg Hx   . Colon cancer Neg Hx   . Esophageal cancer Neg Hx   . Rectal cancer Neg Hx   . Stomach cancer Neg Hx   . Pancreatic cancer Neg Hx     Review of Systems  Constitutional: Negative for chills, fatigue and fever.  Eyes: Negative for visual disturbance.  Respiratory: Negative for cough, shortness of breath and wheezing.   Cardiovascular: Negative for chest pain, palpitations and leg swelling.  Gastrointestinal: Positive for constipation (metamucil as needed). Negative for abdominal pain, blood in stool, diarrhea and nausea.       No gerd  Genitourinary: Negative for dysuria and hematuria.  Musculoskeletal: Positive for arthralgias (left hip bursitis). Negative for back pain.  Skin: Negative for color change and rash.  Neurological: Negative for dizziness, light-headedness and headaches.  Psychiatric/Behavioral: Negative for dysphoric mood. The patient is not nervous/anxious.        Objective:   Vitals:   09/05/17 0853  BP: 124/88  Pulse: 80  Resp: 16  Temp: 97.8 F (36.6 C)  SpO2: 98%   Filed Weights   09/05/17 0853  Weight: 246 lb (111.6 kg)   Body mass index is 42.23 kg/m.  Wt Readings from Last 3 Encounters:  09/05/17 246 lb (111.6 kg)  04/21/17 250 lb (113.4 kg)  12/28/16 253 lb (114.8 kg)     Physical Exam Constitutional: She appears well-developed and well-nourished. No distress.  HENT:  Head: Normocephalic and atraumatic.  Right Ear: External ear normal. Normal ear canal and TM Left Ear: External ear normal.  Normal ear canal and TM Mouth/Throat: Oropharynx is clear and moist.  Eyes: Conjunctivae and EOM are normal.  Neck: Neck supple. No tracheal deviation present. No thyromegaly  present.  No carotid bruit  Cardiovascular: Normal rate, regular rhythm and normal heart sounds.   No murmur heard.  No edema. Pulmonary/Chest: Effort normal and breath sounds normal. No respiratory distress. She has no wheezes. She has no rales.  Breast: deferred to [facility] exhibits no distension. There is no tenderness.  Lymphadenopathy: She has no cervical adenopathy.  Skin: Skin is warm and dry. She is not diaphoretic.  Psychiatric: She has a normal mood and affect.  Her behavior is normal.        Assessment & Plan:   Physical exam: Screening blood work  ordered Immunizations   Discussed shingrix, others up to date Colonoscopy   Up to date  Mammogram   do-she does need a referral, ordered Gyn  - no longer seeing Dex[facility]  Up to date  Eye exams   Up to date  EKG  Last done 2013 Exercise   Trainer 2 / week, walking some, rides the bike 15/day Weight  Working on weight loss Skin  Sees dr lomax annually, no concerns Substance abuse   none  See Problem List for Assessment and Plan of chronic medical problems.  Follow-up annually

## 2017-09-05 ENCOUNTER — Ambulatory Visit (INDEPENDENT_AMBULATORY_CARE_PROVIDER_SITE_OTHER): Payer: Medicare HMO | Admitting: Internal Medicine

## 2017-09-05 ENCOUNTER — Encounter: Payer: Self-pay | Admitting: Internal Medicine

## 2017-09-05 ENCOUNTER — Other Ambulatory Visit (INDEPENDENT_AMBULATORY_CARE_PROVIDER_SITE_OTHER): Payer: Medicare HMO

## 2017-09-05 VITALS — BP 124/88 | HR 80 | Temp 97.8°F | Resp 16 | Ht 64.0 in | Wt 246.0 lb

## 2017-09-05 DIAGNOSIS — Z1231 Encounter for screening mammogram for malignant neoplasm of breast: Secondary | ICD-10-CM | POA: Diagnosis not present

## 2017-09-05 DIAGNOSIS — R7309 Other abnormal glucose: Secondary | ICD-10-CM

## 2017-09-05 DIAGNOSIS — M8580 Other specified disorders of bone density and structure, unspecified site: Secondary | ICD-10-CM | POA: Diagnosis not present

## 2017-09-05 DIAGNOSIS — E559 Vitamin D deficiency, unspecified: Secondary | ICD-10-CM

## 2017-09-05 DIAGNOSIS — E7849 Other hyperlipidemia: Secondary | ICD-10-CM

## 2017-09-05 DIAGNOSIS — Z0000 Encounter for general adult medical examination without abnormal findings: Secondary | ICD-10-CM | POA: Diagnosis not present

## 2017-09-05 LAB — CBC WITH DIFFERENTIAL/PLATELET
Basophils Absolute: 0.1 10*3/uL (ref 0.0–0.1)
Basophils Relative: 0.8 % (ref 0.0–3.0)
EOS ABS: 0.2 10*3/uL (ref 0.0–0.7)
Eosinophils Relative: 3.2 % (ref 0.0–5.0)
HCT: 46.5 % — ABNORMAL HIGH (ref 36.0–46.0)
Hemoglobin: 15.6 g/dL — ABNORMAL HIGH (ref 12.0–15.0)
LYMPHS ABS: 2.1 10*3/uL (ref 0.7–4.0)
Lymphocytes Relative: 29.7 % (ref 12.0–46.0)
MCHC: 33.6 g/dL (ref 30.0–36.0)
MCV: 90.3 fl (ref 78.0–100.0)
MONO ABS: 0.8 10*3/uL (ref 0.1–1.0)
Monocytes Relative: 11.3 % (ref 3.0–12.0)
NEUTROS ABS: 3.8 10*3/uL (ref 1.4–7.7)
NEUTROS PCT: 55 % (ref 43.0–77.0)
PLATELETS: 336 10*3/uL (ref 150.0–400.0)
RBC: 5.15 Mil/uL — ABNORMAL HIGH (ref 3.87–5.11)
RDW: 13.3 % (ref 11.5–15.5)
WBC: 7 10*3/uL (ref 4.0–10.5)

## 2017-09-05 LAB — HEMOGLOBIN A1C: Hgb A1c MFr Bld: 5.7 % (ref 4.6–6.5)

## 2017-09-05 LAB — C-REACTIVE PROTEIN: CRP: 0.3 mg/dL — ABNORMAL LOW (ref 0.5–20.0)

## 2017-09-05 LAB — TSH: TSH: 3.36 u[IU]/mL (ref 0.35–4.50)

## 2017-09-05 LAB — COMPREHENSIVE METABOLIC PANEL
ALT: 17 U/L (ref 0–35)
AST: 19 U/L (ref 0–37)
Albumin: 4.4 g/dL (ref 3.5–5.2)
Alkaline Phosphatase: 56 U/L (ref 39–117)
BILIRUBIN TOTAL: 0.6 mg/dL (ref 0.2–1.2)
BUN: 15 mg/dL (ref 6–23)
CALCIUM: 10.2 mg/dL (ref 8.4–10.5)
CHLORIDE: 102 meq/L (ref 96–112)
CO2: 28 meq/L (ref 19–32)
CREATININE: 0.8 mg/dL (ref 0.40–1.20)
GFR: 74.53 mL/min (ref >60.00)
Glucose, Bld: 90 mg/dL (ref 70–99)
Potassium: 4.8 mEq/L (ref 3.5–5.1)
SODIUM: 141 meq/L (ref 135–145)
Total Protein: 7.4 g/dL (ref 6.0–8.3)

## 2017-09-05 LAB — LIPID PANEL
CHOL/HDL RATIO: 2
Cholesterol: 147 mg/dL (ref 0–200)
HDL: 65.6 mg/dL (ref >39.00)
LDL Cholesterol: 66 mg/dL (ref 0–99)
NonHDL: 81.45
Triglycerides: 79 mg/dL (ref 0.0–149.0)
VLDL: 15.8 mg/dL (ref 0.0–40.0)

## 2017-09-05 LAB — T4, FREE: FREE T4: 0.92 ng/dL (ref 0.60–1.60)

## 2017-09-05 LAB — VITAMIN D 25 HYDROXY (VIT D DEFICIENCY, FRACTURES): VITD: 47.56 ng/mL (ref 30.00–100.00)

## 2017-09-05 LAB — T3, FREE: T3, Free: 3.7 pg/mL (ref 2.3–4.2)

## 2017-09-05 NOTE — Progress Notes (Signed)
error 

## 2017-09-05 NOTE — Assessment & Plan Note (Signed)
Check lipid panel  Continue daily statin Regular exercise and healthy diet encouraged  

## 2017-09-05 NOTE — Assessment & Plan Note (Signed)
Bone density up-to-date She is exercising regularly Taking calcium and vitamin D daily

## 2017-09-05 NOTE — Assessment & Plan Note (Signed)
Check a1c Low sugar / carb diet Stressed regular exercise, weight loss  

## 2017-09-05 NOTE — Assessment & Plan Note (Signed)
Taking vitamin D daily, history of osteopenia Will check vitamin D level

## 2017-09-15 ENCOUNTER — Telehealth: Payer: Self-pay | Admitting: Internal Medicine

## 2017-09-15 NOTE — Telephone Encounter (Signed)
Were you mailing these to him? I can put a copy of them in the mail if you would like for me to.

## 2017-09-15 NOTE — Telephone Encounter (Signed)
Copied from [place]. Topic: Quick Communication - See Telephone Encounter >> Sep 15, 2017  4:16 PM Tanya [name], NT wrote: CRM for notification. See Telephone encounter for:   09/15/17. Pt. Calling she hasn't received her lab results in the mail. Would like to know the status.

## 2017-09-15 NOTE — Telephone Encounter (Signed)
Pt notified these were mailed one day last week via voicemail.

## 2017-10-29 DIAGNOSIS — Z1231 Encounter for screening mammogram for malignant neoplasm of breast: Secondary | ICD-10-CM | POA: Diagnosis not present

## 2017-10-29 DIAGNOSIS — Z853 Personal history of malignant neoplasm of breast: Secondary | ICD-10-CM | POA: Diagnosis not present

## 2017-10-29 LAB — HM MAMMOGRAPHY

## 2017-11-04 ENCOUNTER — Encounter: Payer: Self-pay | Admitting: Internal Medicine

## 2018-01-09 ENCOUNTER — Other Ambulatory Visit: Payer: Self-pay | Admitting: Emergency Medicine

## 2018-01-09 MED ORDER — CETIRIZINE HCL 10 MG PO CAPS: 10.0000 mg | ORAL_CAPSULE | Freq: Every day | ORAL | 3 refills | Status: DC

## 2018-03-20 DIAGNOSIS — M1711 Unilateral primary osteoarthritis, right knee: Secondary | ICD-10-CM | POA: Diagnosis not present

## 2018-03-20 DIAGNOSIS — M1712 Unilateral primary osteoarthritis, left knee: Secondary | ICD-10-CM | POA: Diagnosis not present

## 2018-04-03 DIAGNOSIS — H5213 Myopia, bilateral: Secondary | ICD-10-CM | POA: Diagnosis not present

## 2018-04-05 ENCOUNTER — Other Ambulatory Visit: Payer: Self-pay | Admitting: Internal Medicine

## 2018-04-05 DIAGNOSIS — E7800 Pure hypercholesterolemia, unspecified: Secondary | ICD-10-CM

## 2018-04-08 DIAGNOSIS — M1712 Unilateral primary osteoarthritis, left knee: Secondary | ICD-10-CM | POA: Diagnosis not present

## 2018-04-10 DIAGNOSIS — M1712 Unilateral primary osteoarthritis, left knee: Secondary | ICD-10-CM | POA: Diagnosis not present

## 2018-04-15 DIAGNOSIS — M1712 Unilateral primary osteoarthritis, left knee: Secondary | ICD-10-CM | POA: Diagnosis not present

## 2018-04-21 DIAGNOSIS — M1712 Unilateral primary osteoarthritis, left knee: Secondary | ICD-10-CM | POA: Diagnosis not present

## 2018-04-23 DIAGNOSIS — M179 Osteoarthritis of knee, unspecified: Secondary | ICD-10-CM | POA: Diagnosis not present

## 2018-04-27 NOTE — Progress Notes (Signed)
Subjective:    Patient ID: Tanya [name], female    DOB: 06-27-1946, 72 y.o.   MRN: [id number]  HPI The patient is here for an acute visit.   Left eye itching:  It started 3 days ago when she was working out in the yard.  She thinks she may have rubbed her eye. The eye was matted shut upon wakening the following day.  The inside of her eye was irritated and slightly itchy initially but that has resolved.  She has been rinsing out her eye with saline.  Her upper eye lid has been red and itchy.  She denies any bumps in the eye lid or styes.    She has been applying compresses to the eye and her symptoms have improved significantly.    She denies a feeling of a foreign body in the eye, there has been no redness of the white of theye., no eye pain or vision changes.  She denies any recent cold symptoms  .   Medications and allergies reviewed with patient and updated if appropriate.  Patient Active Problem List   Diagnosis Date Noted  . Herpes zoster without complication 11/91/4782  . Rhinitis, chronic 08/29/2015  . 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/29/2015  . Essential tremor 12/14/2013  . Osteopenia 06/11/2013  . OA (osteoarthritis) of knee 02/24/2012  . FASTING HYPERGLYCEMIA 04/06/2009  . NONTOXIC MULTINODULAR GOITER 03/03/2008  . Hyperlipidemia 12/02/2007  . ALLERGIC RHINITIS 12/02/2007  . Personal history of malignant neoplasm of breast 12/02/2007  . History of skin cancer 12/02/2007  . Vitamin D deficiency 12/02/2007    Current Outpatient Medications on File Prior to Visit  Medication Sig Dispense Refill  . aspirin EC 81 MG tablet Take 81 mg by mouth daily.    Marland Kitchen azelastine (ASTELIN) 0.1 % nasal spray Place into both nostrils 2 (two) times daily. Use in each nostril as directed    . Calcium Carbonate-Vitamin D (CALCIUM 600 + D PO) Take by mouth 2 (two) times daily.    . Cetirizine HCl (ZYRTEC ALLERGY) 10 MG CAPS Take 1 capsule (10 mg total) by mouth daily. 90 capsule 3  .  cholecalciferol (VITAMIN D) 1000 UNITS tablet Take 2,000 Units by mouth daily.    Marland Kitchen FLUTICASONE PROPIONATE NA Place into the nose daily as needed.    Marland Kitchen ipratropium (ATROVENT) 0.06 % nasal spray Place 2 sprays into both nostrils 4 (four) times daily.    . Multiple Vitamin (MULTIVITAMIN) tablet Take 1 tablet by mouth daily.    . simvastatin (ZOCOR) 20 MG tablet TAKE 1 TABLET IN THE EVENING 90 tablet 0   No current facility-administered medications on file prior to visit.     Past Medical History:  Diagnosis Date  . Arthritis   . Bursitis 2014   Left; Dr Maureen Ralphs  . Cancer ([place]) 1986   breast cancer L  . Hyperlipidemia   . Lymphedema 2000   LUE  . perennial allergies   . PONV (postoperative nausea and vomiting)   . Skin cancer    basil cell    Past Surgical History:  Procedure Laterality Date  . ABDOMINAL HYSTERECTOMY  1990   & BSO for fibroids  . ANKLE FRACTURE SURGERY  2006  . BREAST RECONSTRUCTION  1986  . BREAST SURGERY  1986   double mastectonmy fror cancer on L  . COLONOSCOPY  04/14/2006   Tics ; Dr Earlean Shawl  . KNEE ARTHROSCOPY  2001 & 2011   L & R  .  TOTAL KNEE ARTHROPLASTY  02/24/2012   Procedure: TOTAL KNEE ARTHROPLASTY;  Surgeon: Gearlean Alf, MD;  Location: [facility];  Service: Orthopedics;  Laterality: Right;  . WISDOM TOOTH EXTRACTION      Social History   Socioeconomic History  . Marital status: Married    Spouse name: Not on file  . Number of children: Not on file  . Years of education: Not on file  . Highest education level: Not on file  Occupational History  . Not on file  Social Needs  . Financial resource strain: Not on file  . Food insecurity:    Worry: Not on file    Inability: Not on file  . Transportation needs:    Medical: Not on file    Non-medical: Not on file  Tobacco Use  . Smoking status: Never Smoker  . Smokeless tobacco: Never Used  Substance and Sexual Activity  . Alcohol use: Yes    Comment:  very rarely, 1-2 per month  . Drug  use: No  . Sexual activity: Not on file  Lifestyle  . Physical activity:    Days per week: Not on file    Minutes per session: Not on file  . Stress: Not on file  Relationships  . Social connections:    Talks on phone: Not on file    Gets together: Not on file    Attends religious service: Not on file    Active member of club or organization: Not on file    Attends meetings of clubs or organizations: Not on file    Relationship status: Not on file  Other Topics Concern  . Not on file  Social History Narrative  . Not on file    Family History  Problem Relation Age of Onset  . Hypertension Mother   . Heart disease Mother        pacer  . Colon polyps Mother        pre cancerous  . Glaucoma Mother   . Dementia Mother   . Heart attack Maternal Grandmother 94  . Hypertension Maternal Grandmother   . Stroke Maternal Grandfather 44  . Hypertension Maternal Grandfather   . Heart attack Maternal Uncle 65  . Cancer Neg Hx   . Colon cancer Neg Hx   . Esophageal cancer Neg Hx   . Rectal cancer Neg Hx   . Stomach cancer Neg Hx   . Pancreatic cancer Neg Hx     Review of Systems Per HPI    Objective:   Vitals:   04/28/18 1013  BP: 132/86  Pulse: 70  Resp: 16  Temp: (!) 97.5 F (36.4 C)  SpO2: 96%   BP Readings from Last 3 Encounters:  04/28/18 132/86  09/05/17 124/88  04/21/17 122/80   Wt Readings from Last 3 Encounters:  04/28/18 230 lb 9.6 oz (104.6 kg)  09/05/17 246 lb (111.6 kg)  04/21/17 250 lb (113.4 kg)   Body mass index is 39.58 kg/m.   Physical Exam  Constitutional: She appears well-developed and well-nourished. No distress.  HENT:  Head: Normocephalic and atraumatic.  Right Ear: External ear normal.  Left Ear: External ear normal.  Eyes: Pupils are equal, round, and reactive to light. Conjunctivae and EOM are normal. Right eye exhibits no discharge. Left eye exhibits no discharge. No scleral icterus.  Lymphadenopathy:    She has no cervical  adenopathy.  Skin: Skin is warm and dry. She is not diaphoretic. There is erythema (minimal erythema distal  left uppper eye lid).         Assessment & Plan:    See Problem List for Assessment and Plan of chronic medical problems.

## 2018-04-28 ENCOUNTER — Encounter: Payer: Self-pay | Admitting: Internal Medicine

## 2018-04-28 ENCOUNTER — Ambulatory Visit (INDEPENDENT_AMBULATORY_CARE_PROVIDER_SITE_OTHER): Payer: Medicare HMO | Admitting: Internal Medicine

## 2018-04-28 VITALS — BP 132/86 | HR 70 | Temp 97.5°F | Resp 16 | Ht 64.0 in | Wt 230.6 lb

## 2018-04-28 DIAGNOSIS — M1712 Unilateral primary osteoarthritis, left knee: Secondary | ICD-10-CM | POA: Diagnosis not present

## 2018-04-28 DIAGNOSIS — H5789 Other specified disorders of eye and adnexa: Secondary | ICD-10-CM | POA: Diagnosis not present

## 2018-04-28 NOTE — Patient Instructions (Signed)
Continue to treat your left eye conservatively with compresses and saline.   If your symptoms do not go away completely please let me now and we will call you in something.

## 2018-04-28 NOTE — Assessment & Plan Note (Signed)
Irritation of left eye and upper eye lid No evidence of conjunctivitis Minimal to no redness upper eye lid on exam, which may be from rubbing the eye No treatment needed at this time Continue saline rinses and compresses Call if symptoms do not resolve competely

## 2018-04-30 DIAGNOSIS — M1712 Unilateral primary osteoarthritis, left knee: Secondary | ICD-10-CM | POA: Diagnosis not present

## 2018-05-07 DIAGNOSIS — M1712 Unilateral primary osteoarthritis, left knee: Secondary | ICD-10-CM | POA: Diagnosis not present

## 2018-05-13 DIAGNOSIS — M1712 Unilateral primary osteoarthritis, left knee: Secondary | ICD-10-CM | POA: Diagnosis not present

## 2018-05-15 DIAGNOSIS — M1712 Unilateral primary osteoarthritis, left knee: Secondary | ICD-10-CM | POA: Diagnosis not present

## 2018-05-21 DIAGNOSIS — M179 Osteoarthritis of knee, unspecified: Secondary | ICD-10-CM | POA: Diagnosis not present

## 2018-05-26 DIAGNOSIS — M1712 Unilateral primary osteoarthritis, left knee: Secondary | ICD-10-CM | POA: Diagnosis not present

## 2018-05-28 DIAGNOSIS — M1712 Unilateral primary osteoarthritis, left knee: Secondary | ICD-10-CM | POA: Diagnosis not present

## 2018-06-02 DIAGNOSIS — M179 Osteoarthritis of knee, unspecified: Secondary | ICD-10-CM | POA: Diagnosis not present

## 2018-06-02 DIAGNOSIS — M1712 Unilateral primary osteoarthritis, left knee: Secondary | ICD-10-CM | POA: Diagnosis not present

## 2018-06-19 ENCOUNTER — Telehealth: Payer: Self-pay | Admitting: Internal Medicine

## 2018-06-19 NOTE — Telephone Encounter (Signed)
Attempted to call patient to schedule AWV, but patient did not answer. Could not leave voicemail. Will try to call patient at a later time. SF

## 2018-07-01 ENCOUNTER — Other Ambulatory Visit: Payer: Self-pay | Admitting: Internal Medicine

## 2018-07-01 DIAGNOSIS — E7800 Pure hypercholesterolemia, unspecified: Secondary | ICD-10-CM

## 2018-07-16 DIAGNOSIS — M1712 Unilateral primary osteoarthritis, left knee: Secondary | ICD-10-CM | POA: Diagnosis not present

## 2018-07-24 ENCOUNTER — Telehealth: Payer: Self-pay | Admitting: Internal Medicine

## 2018-07-24 NOTE — Telephone Encounter (Signed)
We received a surgical clearance form via fax for patient. Patient was last seen on 04/2018.   LVM to inform patient she needs a surgical clearance appointment to have this form completed.   When making this appointment make sure it is in a 82min spot.

## 2018-08-05 DIAGNOSIS — D1801 Hemangioma of skin and subcutaneous tissue: Secondary | ICD-10-CM | POA: Diagnosis not present

## 2018-08-05 DIAGNOSIS — L821 Other seborrheic keratosis: Secondary | ICD-10-CM | POA: Diagnosis not present

## 2018-08-05 DIAGNOSIS — L570 Actinic keratosis: Secondary | ICD-10-CM | POA: Diagnosis not present

## 2018-08-05 DIAGNOSIS — Z85828 Personal history of other malignant neoplasm of skin: Secondary | ICD-10-CM | POA: Diagnosis not present

## 2018-08-05 DIAGNOSIS — D225 Melanocytic nevi of trunk: Secondary | ICD-10-CM | POA: Diagnosis not present

## 2018-08-05 DIAGNOSIS — L814 Other melanin hyperpigmentation: Secondary | ICD-10-CM | POA: Diagnosis not present

## 2018-08-05 DIAGNOSIS — L918 Other hypertrophic disorders of the skin: Secondary | ICD-10-CM | POA: Diagnosis not present

## 2018-09-14 NOTE — Patient Instructions (Addendum)
Tests ordered today. Your results will be released to MyChart (or called to you) after review, usually within 72hours after test completion. If any changes need to be made, you will be notified at that same time.  All other Health Maintenance issues reviewed.   All recommended immunizations and age-appropriate screenings are up-to-date or discussed.  No immunizations administered today.   Medications reviewed and updated.  Changes include :  none    Please followup in 1 year   Health Maintenance, Female Adopting a healthy lifestyle and getting preventive care can go a long way to promote health and wellness. Talk with your health care provider about what schedule of regular examinations is right for you. This is a good chance for you to check in with your provider about disease prevention and staying healthy. In between checkups, there are plenty of things you can do on your own. Experts have done a lot of research about which lifestyle changes and preventive measures are most likely to keep you healthy. Ask your health care provider for more information. Weight and diet Eat a healthy diet  Be sure to include plenty of vegetables, fruits, low-fat dairy products, and lean protein.  Do not eat a lot of foods high in solid fats, added sugars, or salt.  Get regular exercise. This is one of the most important things you can do for your health. ? Most adults should exercise for at least 150 minutes each week. The exercise should increase your heart rate and make you sweat (moderate-intensity exercise). ? Most adults should also do strengthening exercises at least twice a week. This is in addition to the moderate-intensity exercise. Maintain a healthy weight  Body mass index (BMI) is a measurement that can be used to identify possible weight problems. It estimates body fat based on height and weight. Your health care provider can help determine your BMI and help you achieve or maintain a  healthy weight.  For females 20 years of age and older: ? A BMI below 18.5 is considered underweight. ? A BMI of 18.5 to 24.9 is normal. ? A BMI of 25 to 29.9 is considered overweight. ? A BMI of 30 and above is considered obese. Watch levels of cholesterol and blood lipids  You should start having your blood tested for lipids and cholesterol at 75 years of age, then have this test every 5 years.  You may need to have your cholesterol levels checked more often if: ? Your lipid or cholesterol levels are high. ? You are older than 75 years of age. ? You are at high risk for heart disease. Cancer screening Lung Cancer  Lung cancer screening is recommended for adults 55-80 years old who are at high risk for lung cancer because of a history of smoking.  A yearly low-dose CT scan of the lungs is recommended for people who: ? Currently smoke. ? Have quit within the past 15 years. ? Have at least a 30-pack-year history of smoking. A pack year is smoking an average of one pack of cigarettes a day for 1 year.  Yearly screening should continue until it has been 15 years since you quit.  Yearly screening should stop if you develop a health problem that would prevent you from having lung cancer treatment. Breast Cancer  Practice breast self-awareness. This means understanding how your breasts normally appear and feel.  It also means doing regular breast self-exams. Let your health care provider know about any changes, no matter how small.    If you are in your 20s or 30s, you should have a clinical breast exam (CBE) by a health care provider every 1-3 years as part of a regular health exam.  If you are 40 or older, have a CBE every year. Also consider having a breast X-ray (mammogram) every year.  If you have a family history of breast cancer, talk to your health care provider about genetic screening.  If you are at high risk for breast cancer, talk to your health care provider about having  an MRI and a mammogram every year.  Breast cancer gene (BRCA) assessment is recommended for women who have family members with BRCA-related cancers. BRCA-related cancers include: ? Breast. ? Ovarian. ? Tubal. ? Peritoneal cancers.  Results of the assessment will determine the need for genetic counseling and BRCA1 and BRCA2 testing. Cervical Cancer Your health care provider may recommend that you be screened regularly for cancer of the pelvic organs (ovaries, uterus, and vagina). This screening involves a pelvic examination, including checking for microscopic changes to the surface of your cervix (Pap test). You may be encouraged to have this screening done every 3 years, beginning at age 21.  For women ages 30-65, health care providers may recommend pelvic exams and Pap testing every 3 years, or they may recommend the Pap and pelvic exam, combined with testing for human papilloma virus (HPV), every 5 years. Some types of HPV increase your risk of cervical cancer. Testing for HPV may also be done on women of any age with unclear Pap test results.  Other health care providers may not recommend any screening for nonpregnant women who are considered low risk for pelvic cancer and who do not have symptoms. Ask your health care provider if a screening pelvic exam is right for you.  If you have had past treatment for cervical cancer or a condition that could lead to cancer, you need Pap tests and screening for cancer for at least 20 years after your treatment. If Pap tests have been discontinued, your risk factors (such as having a new sexual partner) need to be reassessed to determine if screening should resume. Some women have medical problems that increase the chance of getting cervical cancer. In these cases, your health care provider may recommend more frequent screening and Pap tests. Colorectal Cancer  This type of cancer can be detected and often prevented.  Routine colorectal cancer screening  usually begins at 75 years of age and continues through 75 years of age.  Your health care provider may recommend screening at an earlier age if you have risk factors for colon cancer.  Your health care provider may also recommend using home test kits to check for hidden blood in the stool.  A small camera at the end of a tube can be used to examine your colon directly (sigmoidoscopy or colonoscopy). This is done to check for the earliest forms of colorectal cancer.  Routine screening usually begins at age 50.  Direct examination of the colon should be repeated every 5-10 years through 75 years of age. However, you may need to be screened more often if early forms of precancerous polyps or small growths are found. Skin Cancer  Check your skin from head to toe regularly.  Tell your health care provider about any new moles or changes in moles, especially if there is a change in a mole's shape or color.  Also tell your health care provider if you have a mole that is larger than the   size of a pencil eraser.  Always use sunscreen. Apply sunscreen liberally and repeatedly throughout the day.  Protect yourself by wearing long sleeves, pants, a wide-brimmed hat, and sunglasses whenever you are outside. Heart disease, diabetes, and high blood pressure  High blood pressure causes heart disease and increases the risk of stroke. High blood pressure is more likely to develop in: ? People who have blood pressure in the high end of the normal range (130-139/85-89 mm Hg). ? People who are overweight or obese. ? People who are African American.  If you are 18-39 years of age, have your blood pressure checked every 3-5 years. If you are 40 years of age or older, have your blood pressure checked every year. You should have your blood pressure measured twice-once when you are at a hospital or clinic, and once when you are not at a hospital or clinic. Record the average of the two measurements. To check your  blood pressure when you are not at a hospital or clinic, you can use: ? An automated blood pressure machine at a pharmacy. ? A home blood pressure monitor.  If you are between 55 years and 79 years old, ask your health care provider if you should take aspirin to prevent strokes.  Have regular diabetes screenings. This involves taking a blood sample to check your fasting blood sugar level. ? If you are at a normal weight and have a low risk for diabetes, have this test once every three years after 75 years of age. ? If you are overweight and have a high risk for diabetes, consider being tested at a younger age or more often. Preventing infection Hepatitis B  If you have a higher risk for hepatitis B, you should be screened for this virus. You are considered at high risk for hepatitis B if: ? You were born in a country where hepatitis B is common. Ask your health care provider which countries are considered high risk. ? Your parents were born in a high-risk country, and you have not been immunized against hepatitis B (hepatitis B vaccine). ? You have HIV or AIDS. ? You use needles to inject street drugs. ? You live with someone who has hepatitis B. ? You have had sex with someone who has hepatitis B. ? You get hemodialysis treatment. ? You take certain medicines for conditions, including cancer, organ transplantation, and autoimmune conditions. Hepatitis C  Blood testing is recommended for: ? Everyone born from 1945 through 1965. ? Anyone with known risk factors for hepatitis C. Sexually transmitted infections (STIs)  You should be screened for sexually transmitted infections (STIs) including gonorrhea and chlamydia if: ? You are sexually active and are younger than 75 years of age. ? You are older than 75 years of age and your health care provider tells you that you are at risk for this type of infection. ? Your sexual activity has changed since you were last screened and you are at an  increased risk for chlamydia or gonorrhea. Ask your health care provider if you are at risk.  If you do not have HIV, but are at risk, it may be recommended that you take a prescription medicine daily to prevent HIV infection. This is called pre-exposure prophylaxis (PrEP). You are considered at risk if: ? You are sexually active and do not regularly use condoms or know the HIV status of your partner(s). ? You take drugs by injection. ? You are sexually active with a partner who has HIV.   Talk with your health care provider about whether you are at high risk of being infected with HIV. If you choose to begin PrEP, you should first be tested for HIV. You should then be tested every 3 months for as long as you are taking PrEP. Pregnancy  If you are premenopausal and you may become pregnant, ask your health care provider about preconception counseling.  If you may become pregnant, take 400 to 800 micrograms (mcg) of folic acid every day.  If you want to prevent pregnancy, talk to your health care provider about birth control (contraception). Osteoporosis and menopause  Osteoporosis is a disease in which the bones lose minerals and strength with aging. This can result in serious bone fractures. Your risk for osteoporosis can be identified using a bone density scan.  If you are 65 years of age or older, or if you are at risk for osteoporosis and fractures, ask your health care provider if you should be screened.  Ask your health care provider whether you should take a calcium or vitamin D supplement to lower your risk for osteoporosis.  Menopause may have certain physical symptoms and risks.  Hormone replacement therapy may reduce some of these symptoms and risks. Talk to your health care provider about whether hormone replacement therapy is right for you. Follow these instructions at home:  Schedule regular health, dental, and eye exams.  Stay current with your immunizations.  Do not use  any tobacco products including cigarettes, chewing tobacco, or electronic cigarettes.  If you are pregnant, do not drink alcohol.  If you are breastfeeding, limit how much and how often you drink alcohol.  Limit alcohol intake to no more than 1 drink per day for nonpregnant women. One drink equals 12 ounces of beer, 5 ounces of wine, or 1 ounces of hard liquor.  Do not use street drugs.  Do not share needles.  Ask your health care provider for help if you need support or information about quitting drugs.  Tell your health care provider if you often feel depressed.  Tell your health care provider if you have ever been abused or do not feel safe at home. This information is not intended to replace advice given to you by your health care provider. Make sure you discuss any questions you have with your health care provider. Document Released: 01/14/2011 Document Revised: 12/07/2015 Document Reviewed: 04/04/2015 [employer] Interactive Patient Education  2019 [employer]  

## 2018-09-14 NOTE — Progress Notes (Signed)
Subjective:    Patient ID: Tanya [name], female    DOB: 08-20-1945, 73 y.o.   MRN: [id number]  HPI She is here for a physical exam and pre-op evaluation.   If she does not chew her meat (steak or pork) well it will get stuck in her esophagus.  She denies problems with any other food.  She thinks she just needs to chew more and eat slower.  She has never had any issues with her esophagus.   She is having surgery with Dr Wynelle Link on 10/26/18 - left total knee replacement and needs pre-op clearance.  She denies chest pain, sob and palpitations with activity and at rest.  She does exercise regularly.  She has tolerated anesthesia in the past and denies any bleeding or blood clot issues for her or family.     Medications and allergies reviewed with patient and updated if appropriate.  Patient Active Problem List   Diagnosis Date Noted  . Hyperglycemia 09/15/2018  . Pre-op examination 09/15/2018  . Irritation of left eye 04/28/2018  . Herpes zoster without complication 16/55/3748  . Rhinitis, chronic 08/29/2015  . 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/29/2015  . Essential tremor 12/14/2013  . Osteopenia 06/11/2013  . OA (osteoarthritis) of knee 02/24/2012  . Prediabetes 04/06/2009  . NONTOXIC MULTINODULAR GOITER 03/03/2008  . Hyperlipidemia 12/02/2007  . ALLERGIC RHINITIS 12/02/2007  . Personal history of malignant neoplasm of breast 12/02/2007  . History of skin cancer 12/02/2007  . Vitamin D deficiency 12/02/2007    Current Outpatient Medications on File Prior to Visit  Medication Sig Dispense Refill  . aspirin EC 81 MG tablet Take 81 mg by mouth daily.    Marland Kitchen azelastine (ASTELIN) 0.1 % nasal spray Place into both nostrils 2 (two) times daily. Use in each nostril as directed    . Calcium Carbonate-Vitamin D (CALCIUM 600 + D PO) Take by mouth 2 (two) times daily.    . cholecalciferol (VITAMIN D) 1000 UNITS tablet Take 2,000 Units by mouth daily.    Marland Kitchen FLUTICASONE PROPIONATE NA Place into the nose  daily as needed.    Marland Kitchen ipratropium (ATROVENT) 0.06 % nasal spray Place 2 sprays into both nostrils 4 (four) times daily.    . Multiple Vitamin (MULTIVITAMIN) tablet Take 1 tablet by mouth daily.     No current facility-administered medications on file prior to visit.     Past Medical History:  Diagnosis Date  . Arthritis   . Bursitis 2014   Left; Dr Maureen Ralphs  . Cancer ([place]) 1986   breast cancer L  . Hyperlipidemia   . Lymphedema 2000   LUE  . perennial allergies   . PONV (postoperative nausea and vomiting)   . Skin cancer    basil cell    Past Surgical History:  Procedure Laterality Date  . ABDOMINAL HYSTERECTOMY  1990   & BSO for fibroids  . ANKLE FRACTURE SURGERY  2006  . BREAST RECONSTRUCTION  1986  . BREAST SURGERY  1986   double mastectonmy fror cancer on L  . COLONOSCOPY  04/14/2006   Tics ; Dr Earlean Shawl  . KNEE ARTHROSCOPY  2001 & 2011   L & R  . TOTAL KNEE ARTHROPLASTY  02/24/2012   Procedure: TOTAL KNEE ARTHROPLASTY;  Surgeon: Gearlean Alf, MD;  Location: [facility];  Service: Orthopedics;  Laterality: Right;  . WISDOM TOOTH EXTRACTION      Social History   Socioeconomic History  . Marital status: Married  Spouse name: Not on file  . Number of children: Not on file  . Years of education: Not on file  . Highest education level: Not on file  Occupational History  . Not on file  Social Needs  . Financial resource strain: Not on file  . Food insecurity:    Worry: Not on file    Inability: Not on file  . Transportation needs:    Medical: Not on file    Non-medical: Not on file  Tobacco Use  . Smoking status: Never Smoker  . Smokeless tobacco: Never Used  Substance and Sexual Activity  . Alcohol use: Yes    Comment:  very rarely, 1-2 per month  . Drug use: No  . Sexual activity: Not on file  Lifestyle  . Physical activity:    Days per week: Not on file    Minutes per session: Not on file  . Stress: Not on file  Relationships  . Social connections:      Talks on phone: Not on file    Gets together: Not on file    Attends religious service: Not on file    Active member of club or organization: Not on file    Attends meetings of clubs or organizations: Not on file    Relationship status: Not on file  Other Topics Concern  . Not on file  Social History Narrative  . Not on file    Family History  Problem Relation Age of Onset  . Hypertension Mother   . Heart disease Mother        pacer  . Colon polyps Mother        pre cancerous  . Glaucoma Mother   . Dementia Mother   . Heart attack Maternal Grandmother 94  . Hypertension Maternal Grandmother   . Stroke Maternal Grandfather 32  . Hypertension Maternal Grandfather   . Heart attack Maternal Uncle 65  . Cancer Neg Hx   . Colon cancer Neg Hx   . Esophageal cancer Neg Hx   . Rectal cancer Neg Hx   . Stomach cancer Neg Hx   . Pancreatic cancer Neg Hx     Review of Systems  Constitutional: Negative for chills and fever.  HENT: Positive for trouble swallowing (occ with pork and red meat).   Eyes: Negative for visual disturbance.  Respiratory: Negative for cough, shortness of breath and wheezing.   Cardiovascular: Negative for chest pain, palpitations and leg swelling.  Gastrointestinal: Positive for constipation (controlled - metamucil). Negative for abdominal pain, blood in stool, diarrhea and nausea.       No gerd  Genitourinary: Negative for dysuria and hematuria.  Musculoskeletal: Positive for arthralgias (knee). Negative for back pain.  Skin: Negative for color change and rash.  Neurological: Negative for dizziness, light-headedness, numbness and headaches.  Psychiatric/Behavioral: Negative for dysphoric mood. The patient is not nervous/anxious.        Objective:   Vitals:   09/15/18 0823  BP: 126/82  Pulse: 71  Resp: 16  SpO2: 97%   Filed Weights   09/15/18 0823  Weight: 225 lb (102.1 kg)   Body mass index is 38.62 kg/m.  BP Readings from Last 3  Encounters:  09/15/18 126/82  04/28/18 132/86  09/05/17 124/88    Wt Readings from Last 3 Encounters:  09/15/18 225 lb (102.1 kg)  04/28/18 230 lb 9.6 oz (104.6 kg)  09/05/17 246 lb (111.6 kg)     Physical Exam Constitutional: She appears well-developed  and well-nourished. No distress.  HENT:  Head: Normocephalic and atraumatic.  Right Ear: External ear normal. Normal ear canal and TM Left Ear: External ear normal.  Normal ear canal and TM Mouth/Throat: Oropharynx is clear and moist.  Eyes: Conjunctivae and EOM are normal.  Neck: Neck supple. No tracheal deviation present. No thyromegaly present.  No carotid bruit  Cardiovascular: Normal rate, regular rhythm and normal heart sounds.   No murmur heard.  No edema. Pulmonary/Chest: Effort normal and breath sounds normal. No respiratory distress. She has no wheezes. She has no rales.  Breast: deferred to Gyn Abdominal: Soft. She exhibits no distension. There is no tenderness.  Lymphadenopathy: She has no cervical adenopathy.  Skin: Skin is warm and dry. She is not diaphoretic.  Psychiatric: She has a normal mood and affect. Her behavior is normal.        Assessment & Plan:   Physical exam: Screening blood work   ordered Immunizations   Up to date  Colonoscopy   Up to date  Mammogram   Up to date  Gyn - no longer seeing Dexa   Up to date  Eye exams   Up to date  EKG   Done 2013 Exercise    twice a week with trainer and walks some Weight  Has lost weight and working on weight loss Skin  Sees derm annually Substance abuse   none  See Problem List for Assessment and Plan of chronic medical problems.    FU annually

## 2018-09-15 ENCOUNTER — Other Ambulatory Visit (INDEPENDENT_AMBULATORY_CARE_PROVIDER_SITE_OTHER): Payer: Medicare HMO

## 2018-09-15 ENCOUNTER — Ambulatory Visit: Payer: Medicare HMO | Admitting: Internal Medicine

## 2018-09-15 ENCOUNTER — Encounter: Payer: Self-pay | Admitting: Internal Medicine

## 2018-09-15 ENCOUNTER — Ambulatory Visit (INDEPENDENT_AMBULATORY_CARE_PROVIDER_SITE_OTHER): Payer: Medicare HMO | Admitting: Internal Medicine

## 2018-09-15 VITALS — BP 126/82 | HR 71 | Resp 16 | Ht 64.0 in | Wt 225.0 lb

## 2018-09-15 DIAGNOSIS — Z0000 Encounter for general adult medical examination without abnormal findings: Secondary | ICD-10-CM

## 2018-09-15 DIAGNOSIS — E7849 Other hyperlipidemia: Secondary | ICD-10-CM

## 2018-09-15 DIAGNOSIS — Z85828 Personal history of other malignant neoplasm of skin: Secondary | ICD-10-CM

## 2018-09-15 DIAGNOSIS — R1310 Dysphagia, unspecified: Secondary | ICD-10-CM

## 2018-09-15 DIAGNOSIS — R7303 Prediabetes: Secondary | ICD-10-CM | POA: Diagnosis not present

## 2018-09-15 DIAGNOSIS — R739 Hyperglycemia, unspecified: Secondary | ICD-10-CM | POA: Insufficient documentation

## 2018-09-15 DIAGNOSIS — Z01818 Encounter for other preprocedural examination: Secondary | ICD-10-CM

## 2018-09-15 DIAGNOSIS — M8580 Other specified disorders of bone density and structure, unspecified site: Secondary | ICD-10-CM | POA: Diagnosis not present

## 2018-09-15 DIAGNOSIS — E7800 Pure hypercholesterolemia, unspecified: Secondary | ICD-10-CM

## 2018-09-15 LAB — CBC WITH DIFFERENTIAL/PLATELET
Basophils Absolute: 0.1 10*3/uL (ref 0.0–0.1)
Basophils Relative: 1.5 % (ref 0.0–3.0)
Eosinophils Absolute: 0.3 10*3/uL (ref 0.0–0.7)
Eosinophils Relative: 3.3 % (ref 0.0–5.0)
HCT: 43.3 % (ref 36.0–46.0)
Hemoglobin: 14.5 g/dL (ref 12.0–15.0)
Lymphocytes Relative: 31.6 % (ref 12.0–46.0)
Lymphs Abs: 2.4 10*3/uL (ref 0.7–4.0)
MCHC: 33.5 g/dL (ref 30.0–36.0)
MCV: 91.3 fl (ref 78.0–100.0)
Monocytes Absolute: 0.7 10*3/uL (ref 0.1–1.0)
Monocytes Relative: 9.8 % (ref 3.0–12.0)
Neutro Abs: 4.1 10*3/uL (ref 1.4–7.7)
Neutrophils Relative %: 53.8 % (ref 43.0–77.0)
Platelets: 342 10*3/uL (ref 150.0–400.0)
RBC: 4.74 Mil/uL (ref 3.87–5.11)
RDW: 13.8 % (ref 11.5–15.5)
WBC: 7.6 10*3/uL (ref 4.0–10.5)

## 2018-09-15 LAB — LIPID PANEL
Cholesterol: 192 mg/dL (ref 0–200)
HDL: 71.3 mg/dL (ref >39.00)
LDL Cholesterol: 97 mg/dL (ref 0–99)
NonHDL: 120.22
Total CHOL/HDL Ratio: 3
Triglycerides: 117 mg/dL (ref 0.0–149.0)
VLDL: 23.4 mg/dL (ref 0.0–40.0)

## 2018-09-15 LAB — COMPREHENSIVE METABOLIC PANEL
ALT: 14 U/L (ref 0–35)
AST: 17 U/L (ref 0–37)
Albumin: 4.4 g/dL (ref 3.5–5.2)
Alkaline Phosphatase: 72 U/L (ref 39–117)
BILIRUBIN TOTAL: 0.6 mg/dL (ref 0.2–1.2)
BUN: 16 mg/dL (ref 6–23)
CO2: 28 mEq/L (ref 19–32)
Calcium: 9.7 mg/dL (ref 8.4–10.5)
Chloride: 102 mEq/L (ref 96–112)
Creatinine, Ser: 0.77 mg/dL (ref 0.40–1.20)
GFR: 73.08 mL/min (ref >60.00)
Glucose, Bld: 85 mg/dL (ref 70–99)
Potassium: 4.6 mEq/L (ref 3.5–5.1)
Sodium: 139 mEq/L (ref 135–145)
Total Protein: 6.8 g/dL (ref 6.0–8.3)

## 2018-09-15 LAB — HEMOGLOBIN A1C: Hgb A1c MFr Bld: 5.5 % (ref 4.6–6.5)

## 2018-09-15 LAB — TSH: TSH: 3.94 u[IU]/mL (ref 0.35–4.50)

## 2018-09-15 MED ORDER — SIMVASTATIN 20 MG PO TABS: 20.0000 mg | ORAL_TABLET | Freq: Every evening | ORAL | 1 refills | Status: DC

## 2018-09-15 MED ORDER — CETIRIZINE HCL 10 MG PO CAPS: 10.0000 mg | ORAL_CAPSULE | Freq: Every day | ORAL | 3 refills | Status: DC

## 2018-09-15 NOTE — Assessment & Plan Note (Signed)
dexa up to date - due later this year Exercising - continue Taking calcium and vitamin d

## 2018-09-15 NOTE — Assessment & Plan Note (Signed)
Sees Dr Ubaldo Glassing annually

## 2018-09-15 NOTE — Assessment & Plan Note (Signed)
Check a1c Low sugar / carb diet Stressed regular exercise Has lost weight since last year

## 2018-09-15 NOTE — Assessment & Plan Note (Signed)
Has difficulty swallowing steak or pork at times - no other food She eats fast and thinks she does not chew her meat enough Would like to wait until after knee surgery to evaluate if needed -- she will let me know if it persists She will try to eat slow and chew meat more thoroughly

## 2018-09-15 NOTE — Assessment & Plan Note (Addendum)
ekg today - NSR at 68 bpm w/o acute changes, normal EKG no change compared to prior EKG from 2013 No h/o CAD, COPD and no concerning symptoms at rest or with exercise Low risk for low risk procedure Check basic labs today Advised to stop ASA 81 mg  - not needed Cleared for surgery - form to be sent to ortho

## 2018-09-15 NOTE — Assessment & Plan Note (Signed)
Check lipid panel  Continue daily statin Regular exercise and healthy diet encouraged  

## 2018-09-16 ENCOUNTER — Ambulatory Visit: Payer: Medicare HMO | Admitting: Internal Medicine

## 2018-09-23 DIAGNOSIS — J309 Allergic rhinitis, unspecified: Secondary | ICD-10-CM | POA: Diagnosis not present

## 2018-09-23 DIAGNOSIS — J300 Vasomotor rhinitis: Secondary | ICD-10-CM | POA: Diagnosis not present

## 2018-09-29 NOTE — H&P (Signed)
TOTAL KNEE ADMISSION H&P  Patient is being admitted for left total knee arthroplasty.  Subjective:  Chief Complaint:left knee pain.  HPI: Tanya [name], 73 y.o. female, has a history of pain and functional disability in the left knee due to arthritis and has failed non-surgical conservative treatments for greater than 12 weeks to includecorticosteriod injections and activity modification.  Onset of symptoms was gradual, starting 1 years ago with gradually worsening course since that time. The patient noted prior procedures on the knee to include  arthroscopy on the left knee(s).  Patient currently rates pain in the left knee(s) at 7 out of 10 with activity. Patient has night pain, worsening of pain with activity and weight bearing and crepitus.  Patient has evidence of bone-on-bone in the patellofemoral compartment with large osteophytes in the posterior aspect of the knee as well as the posterior patella by imaging studies. There is no active infection.  Patient Active Problem List   Diagnosis Date Noted  . Pre-op examination 09/15/2018  . Dysphagia 09/15/2018  . Herpes zoster without complication 89/38/1017  . 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/29/2015  . Essential tremor 12/14/2013  . Osteopenia 06/11/2013  . OA (osteoarthritis) of knee 02/24/2012  . Prediabetes 04/06/2009  . NONTOXIC MULTINODULAR GOITER 03/03/2008  . Hyperlipidemia 12/02/2007  . ALLERGIC RHINITIS 12/02/2007  . Personal history of malignant neoplasm of breast 12/02/2007  . History of skin cancer 12/02/2007  . Vitamin D deficiency 12/02/2007   Past Medical History:  Diagnosis Date  . Arthritis   . Bursitis 2014   Left; Dr Maureen Ralphs  . Cancer ([place]) 1986   breast cancer L  . Hyperlipidemia   . Lymphedema 2000   LUE  . perennial allergies   . PONV (postoperative nausea and vomiting)   . Skin cancer    basil cell    Past Surgical History:  Procedure Laterality Date  . ABDOMINAL HYSTERECTOMY  1990   & BSO for fibroids  .  ANKLE FRACTURE SURGERY  2006  . BREAST RECONSTRUCTION  1986  . BREAST SURGERY  1986   double mastectonmy fror cancer on L  . COLONOSCOPY  04/14/2006   Tics ; Dr Earlean Shawl  . KNEE ARTHROSCOPY  2001 & 2011   L & R  . TOTAL KNEE ARTHROPLASTY  02/24/2012   Procedure: TOTAL KNEE ARTHROPLASTY;  Surgeon: Gearlean Alf, MD;  Location: [facility];  Service: Orthopedics;  Laterality: Right;  . WISDOM TOOTH EXTRACTION      No current facility-administered medications for this encounter.    Current Outpatient Medications  Medication Sig Dispense Refill Last Dose  . azelastine (ASTELIN) 0.1 % nasal spray Place into both nostrils 2 (two) times daily. Use in each nostril as directed   Taking  . Calcium Carbonate-Vitamin D (CALCIUM 600 + D PO) Take by mouth 2 (two) times daily.   Taking  . Cetirizine HCl (ZYRTEC ALLERGY) 10 MG CAPS Take 1 capsule (10 mg total) by mouth daily. 90 capsule 3   . cholecalciferol (VITAMIN D) 1000 UNITS tablet Take 2,000 Units by mouth daily.   Taking  . FLUTICASONE PROPIONATE NA Place into the nose daily as needed.   Taking  . ipratropium (ATROVENT) 0.06 % nasal spray Place 2 sprays into both nostrils 4 (four) times daily.   Taking  . Multiple Vitamin (MULTIVITAMIN) tablet Take 1 tablet by mouth daily.   Taking  . simvastatin (ZOCOR) 20 MG tablet Take 1 tablet (20 mg total) by mouth every evening. 90 tablet 1  Allergies  Allergen Reactions  . Hydromorphone     whelps on skin   . Methocarbamol     Whelps on skin   . Oxycodone     Blister on back  . Prednisone     Reaction=wired per patient    Social History   Tobacco Use  . Smoking status: Never Smoker  . Smokeless tobacco: Never Used  Substance Use Topics  . Alcohol use: Yes    Comment:  very rarely, 1-2 per month    Family History  Problem Relation Age of Onset  . Hypertension Mother   . Heart disease Mother        pacer  . Colon polyps Mother        pre cancerous  . Glaucoma Mother   . Dementia Mother    . Heart attack Maternal Grandmother 94  . Hypertension Maternal Grandmother   . Stroke Maternal Grandfather 33  . Hypertension Maternal Grandfather   . Heart attack Maternal Uncle 65  . Cancer Neg Hx   . Colon cancer Neg Hx   . Esophageal cancer Neg Hx   . Rectal cancer Neg Hx   . Stomach cancer Neg Hx   . Pancreatic cancer Neg Hx      Review of Systems  Constitutional: Negative for chills and fever.  HENT: Negative for congestion, sore throat and tinnitus.   Eyes: Negative for double vision, photophobia and pain.  Respiratory: Negative for cough, shortness of breath and wheezing.   Cardiovascular: Negative for chest pain, palpitations and orthopnea.  Gastrointestinal: Negative for heartburn, nausea and vomiting.  Genitourinary: Negative for dysuria, frequency and urgency.  Musculoskeletal: Positive for joint pain.  Neurological: Negative for dizziness, weakness and headaches.    Objective:  Physical Exam  Well nourished and well developed.  General: Alert and oriented x3, cooperative and pleasant, no acute distress.  Head: normocephalic, atraumatic, neck supple.  Eyes: EOMI.  Respiratory: breath sounds clear in all fields, no wheezing, rales, or rhonchi. Cardiovascular: Regular rate and rhythm, no murmurs, gallops or rubs.  Abdomen: non-tender to palpation and soft, normoactive bowel sounds. Musculoskeletal:  Left Knee Exam:  No effusion. No Swelling. Range of motion is 5-125 degrees.  Moderate crepitus on range of motion of the knee.  Positive medial joint line tenderness No lateral joint line tenderness.  Stable knee.  Calves soft and nontender. Motor function intact in LE. Strength 5/5 LE bilaterally. Neuro: Distal pulses 2+. Sensation to light touch intact in LE.  Vital signs in last 24 hours: Blood pressure: 144/88 mmHg Pulse: 80 bpm  Labs:   Estimated body mass index is 38.62 kg/m as calculated from the following:   Height as of 09/15/18: 5\' 4"  (1.626  m).   Weight as of 09/15/18: 102.1 kg.   Imaging Review Plain radiographs demonstrate severe degenerative joint disease of the left knee(s). The overall alignment isneutral. The bone quality appears to be adequate for age and reported activity level.      Assessment/Plan:  End stage arthritis, left knee   The patient history, physical examination, clinical judgment of the provider and imaging studies are consistent with end stage degenerative joint disease of the left knee(s) and total knee arthroplasty is deemed medically necessary. The treatment options including medical management, injection therapy arthroscopy and arthroplasty were discussed at length. The risks and benefits of total knee arthroplasty were presented and reviewed. The risks due to aseptic loosening, infection, stiffness, patella tracking problems, thromboembolic complications and other imponderables were  discussed. The patient acknowledged the explanation, agreed to proceed with the plan and consent was signed. Patient is being admitted for inpatient treatment for surgery, pain control, PT, OT, prophylactic antibiotics, VTE prophylaxis, progressive ambulation and ADL's and discharge planning. The patient is planning to be discharged home.  Therapy Plans: outpatient therapy at [facility] Disposition: Home with husband Planned DVT Prophylaxis: Xarelto 10 mg daily (hx breast CA) DME needed: None PCP: Celso Amy, MD TXA: IV Allergies: NKDA Anesthesia Concerns: None BMI: 38.9 Other: NO BP OR IV STICKS LEFT ARM  Anticipated LOS equal to or greater than 2 midnights due to - Age 84 and older with one or more of the following:  - Obesity  - Expected need for hospital services (PT, OT, Nursing) required for safe  discharge  - Anticipated need for postoperative skilled nursing care or inpatient rehab  - Active co-morbidities: None OR   - Unanticipated findings during/Post Surgery: None  - Patient is a high risk of  re-admission due to: None   - Patient was instructed on what medications to stop prior to surgery. - Follow-up visit in 2 weeks with Dr. Wynelle Link - Begin physical therapy following surgery - Pre-operative lab work as pre-surgical testing - Prescriptions will be provided in hospital at time of discharge  Theresa Duty, PA-C Orthopedic Surgery [facility] [place]

## 2018-10-26 ENCOUNTER — Ambulatory Visit: Admit: 2018-10-26 | Payer: Medicare HMO | Admitting: Orthopedic Surgery

## 2018-10-26 SURGERY — ARTHROPLASTY, KNEE, TOTAL
Anesthesia: Choice | Laterality: Left

## 2019-01-20 NOTE — H&P (Signed)
TOTAL KNEE ADMISSION H&P  Patient is being admitted for left total knee arthroplasty.  Subjective:  Chief Complaint:left knee pain.  HPI: Tanya [name], 73 y.o. female, has a history of pain and functional disability in the left knee due to arthritis and has failed non-surgical conservative treatments for greater than 12 weeks to includecorticosteriod injections and activity modification.  Onset of symptoms was gradual, starting 1 years ago with gradually worsening course since that time. The patient noted prior procedures on the knee to include  arthroscopy on the left knee(s).  Patient currently rates pain in the left knee(s) at 5 out of 10 with activity. Patient has worsening of pain with activity and weight bearing, pain with passive range of motion, crepitus and instability.  Patient has evidence of bone-on-bone in the patellofemoral compartment with large osteophytes in the posterior aspect of the knee as well as the posterior patella. She has medial joint space narrowing. The lateral compartment shows large osteophytes, but no narrowing by imaging studies. There is no active infection.  Patient Active Problem List   Diagnosis Date Noted  . Pre-op examination 09/15/2018  . Dysphagia 09/15/2018  . Herpes zoster without complication 25/36/6440  . 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/29/2015  . Essential tremor 12/14/2013  . Osteopenia 06/11/2013  . OA (osteoarthritis) of knee 02/24/2012  . Prediabetes 04/06/2009  . NONTOXIC MULTINODULAR GOITER 03/03/2008  . Hyperlipidemia 12/02/2007  . ALLERGIC RHINITIS 12/02/2007  . Personal history of malignant neoplasm of breast 12/02/2007  . History of skin cancer 12/02/2007  . Vitamin D deficiency 12/02/2007   Past Medical History:  Diagnosis Date  . Arthritis   . Bursitis 2014   Left; Dr Maureen Ralphs  . Cancer ([place]) 1986   breast cancer L  . Hyperlipidemia   . Lymphedema 2000   LUE  . perennial allergies   . PONV (postoperative nausea and vomiting)   .  Skin cancer    basil cell    Past Surgical History:  Procedure Laterality Date  . ABDOMINAL HYSTERECTOMY  1990   & BSO for fibroids  . ANKLE FRACTURE SURGERY  2006  . BREAST RECONSTRUCTION  1986  . BREAST SURGERY  1986   double mastectonmy fror cancer on L  . COLONOSCOPY  04/14/2006   Tics ; Dr Earlean Shawl  . KNEE ARTHROSCOPY  2001 & 2011   L & R  . TOTAL KNEE ARTHROPLASTY  02/24/2012   Procedure: TOTAL KNEE ARTHROPLASTY;  Surgeon: Gearlean Alf, MD;  Location: [facility];  Service: Orthopedics;  Laterality: Right;  . WISDOM TOOTH EXTRACTION      No current facility-administered medications for this encounter.    Current Outpatient Medications  Medication Sig Dispense Refill Last Dose  . azelastine (ASTELIN) 0.1 % nasal spray Place into both nostrils 2 (two) times daily. Use in each nostril as directed   Taking  . Calcium Carbonate-Vitamin D (CALCIUM 600 + D PO) Take by mouth 2 (two) times daily.   Taking  . Cetirizine HCl (ZYRTEC ALLERGY) 10 MG CAPS Take 1 capsule (10 mg total) by mouth daily. 90 capsule 3   . cholecalciferol (VITAMIN D) 1000 UNITS tablet Take 2,000 Units by mouth daily.   Taking  . FLUTICASONE PROPIONATE NA Place into the nose daily as needed.   Taking  . ipratropium (ATROVENT) 0.06 % nasal spray Place 2 sprays into both nostrils 4 (four) times daily.   Taking  . Multiple Vitamin (MULTIVITAMIN) tablet Take 1 tablet by mouth daily.   Taking  .  simvastatin (ZOCOR) 20 MG tablet Take 1 tablet (20 mg total) by mouth every evening. 90 tablet 1    Allergies  Allergen Reactions  . Hydromorphone     whelps on skin   . Methocarbamol     Whelps on skin   . Oxycodone     Blister on back  . Prednisone     Reaction=wired per patient    Social History   Tobacco Use  . Smoking status: Never Smoker  . Smokeless tobacco: Never Used  Substance Use Topics  . Alcohol use: Yes    Comment:  very rarely, 1-2 per month    Family History  Problem Relation Age of Onset  .  Hypertension Mother   . Heart disease Mother        pacer  . Colon polyps Mother        pre cancerous  . Glaucoma Mother   . Dementia Mother   . Heart attack Maternal Grandmother 94  . Hypertension Maternal Grandmother   . Stroke Maternal Grandfather 18  . Hypertension Maternal Grandfather   . Heart attack Maternal Uncle 65  . Cancer Neg Hx   . Colon cancer Neg Hx   . Esophageal cancer Neg Hx   . Rectal cancer Neg Hx   . Stomach cancer Neg Hx   . Pancreatic cancer Neg Hx      Review of Systems  Constitutional: Negative for chills and fever.  HENT: Negative for congestion, sore throat and tinnitus.   Eyes: Negative for double vision, photophobia and pain.  Respiratory: Negative for cough, shortness of breath and wheezing.   Cardiovascular: Negative for chest pain, palpitations and orthopnea.  Gastrointestinal: Negative for heartburn, nausea and vomiting.  Genitourinary: Negative for dysuria, frequency and urgency.  Musculoskeletal: Positive for joint pain.  Neurological: Negative for dizziness, weakness and headaches.    Objective:  Physical Exam  Well nourished and well developed.  General: Alert and oriented x3, cooperative and pleasant, no acute distress.  Head: normocephalic, atraumatic, neck supple.  Eyes: EOMI.  Respiratory: breath sounds clear in all fields, no wheezing, rales, or rhonchi. Cardiovascular: Regular rate and rhythm, no murmurs, gallops or rubs.  Abdomen: non-tender to palpation and soft, normoactive bowel sounds. Musculoskeletal:  Left Knee Exam:  No effusion. No Swelling. Range of motion is 5-125 degrees.  Moderate crepitus on range of motion of the knee.  Positive medial joint line tenderness No lateral joint line tenderness.  Stable knee.  Calves soft and nontender. Motor function intact in LE. Strength 5/5 LE bilaterally. Neuro: Distal pulses 2+. Sensation to light touch intact in LE.  Vital signs in last 24 hours: Blood pressure: 152/84  mmHg Pulse: 72 bpm  Labs:   Estimated body mass index is 38.62 kg/m as calculated from the following:   Height as of 09/15/18: 5\' 4"  (1.626 m).   Weight as of 09/15/18: 102.1 kg.   Imaging Review Plain radiographs demonstrate severe degenerative joint disease of the left knee(s). The overall alignment isneutral. The bone quality appears to be adequate for age and reported activity level.  Assessment/Plan:  End stage arthritis, left knee   The patient history, physical examination, clinical judgment of the provider and imaging studies are consistent with end stage degenerative joint disease of the left knee(s) and total knee arthroplasty is deemed medically necessary. The treatment options including medical management, injection therapy arthroscopy and arthroplasty were discussed at length. The risks and benefits of total knee arthroplasty were presented and reviewed.  The risks due to aseptic loosening, infection, stiffness, patella tracking problems, thromboembolic complications and other imponderables were discussed. The patient acknowledged the explanation, agreed to proceed with the plan and consent was signed. Patient is being admitted for inpatient treatment for surgery, pain control, PT, OT, prophylactic antibiotics, VTE prophylaxis, progressive ambulation and ADL's and discharge planning. The patient is planning to be discharged home.  Anticipated LOS equal to or greater than 2 midnights due to - Age 20 and older with one or more of the following:  - Obesity  - Expected need for hospital services (PT, OT, Nursing) required for safe  discharge  - Anticipated need for postoperative skilled nursing care or inpatient rehab  - Active co-morbidities: None OR   - Unanticipated findings during/Post Surgery: None  - Patient is a high risk of re-admission due to: None  Therapy Plans: outpatient therapy at [facility] Disposition: Home with husband Planned DVT Prophylaxis: Xarelto 10 mg  daily (hx breast CA) DME needed: None PCP: Celso Amy, MD TXA: IV Allergies: NKDA Anesthesia Concerns: None BMI: 40.8  Other:  NO BP OR IV STICKS LEFT ARM Patient has gained approximately 10 pounds since last office visit and BMI is now 40.8. She needs to lose at least 5 pounds prior to surgery to meet the requirement, advised patient and she will work on this over the next 4 weeks.  - Patient was instructed on what medications to stop prior to surgery. - Follow-up visit in 2 weeks with Dr. Wynelle Link - Begin physical therapy following surgery - Pre-operative lab work as pre-surgical testing - Prescriptions will be provided in hospital at time of discharge  Theresa Duty, PA-C Orthopedic Surgery [facility] [place]

## 2019-01-25 DIAGNOSIS — M25562 Pain in left knee: Secondary | ICD-10-CM | POA: Insufficient documentation

## 2019-01-26 NOTE — Progress Notes (Signed)
EKG 09-15-18 EPIC

## 2019-01-26 NOTE — Patient Instructions (Addendum)
YOU NEED TO HAVE A COVID 19 TEST ON 01-28-2019 AT 200 PM. .THIS TEST MUST BE DONE BEFORE SURGERY, COME TO [facility] ENTRANCE. ONCE YOUR COVID TEST IS COMPLETED, PLEASE BEGIN THE QUARANTINE INSTRUCTIONS AS OUTLINED IN YOUR HANDOUT.                Tanya [name]    Your procedure is scheduled on: 02-01-2019   Report to [facility] Main  Entrance   Report to  [facility] at 5:30  AM      Call this number if you have problems the morning of surgery [phone]    Remember:  NO SOLID FOOD AFTER MIDNIGHT THE NIGHT PRIOR TO SURGERY. NOTHING BY MOUTH EXCEPT CLEAR LIQUIDS UNTIL 5:15 AM. PLEASE FINISH G2 DRINK PER SURGEON ORDER 3 HOURS PRIOR TO SCHEDULED SURGERY TIME WHICH NEEDS TO BE COMPLETED AT 5:15 AM.    CLEAR LIQUID DIET   Foods Allowed                                                                     Foods Excluded  Coffee and tea, regular and decaf                             liquids that you cannot  Plain Jell-O in any flavor                                             see through such as: Fruit ices (not with fruit pulp)                                     milk, soups, orange juice  Iced Popsicles                                    All solid food Carbonated beverages, regular and diet                                    Cranberry, grape and apple juices Sports drinks like Gatorade Lightly seasoned clear broth or consume(fat free) Sugar, honey syrup  Sample Menu Breakfast                                Lunch                                     Supper Cranberry juice                    Beef broth                            Chicken broth Jell-O  Grape juice                           Apple juice Coffee or tea                        Jell-O                                      Popsicle                                                Coffee or tea                        Coffee or  tea  _____________________________________________________________________     Take these medicines the morning of surgery with A SIP OF WATER:  You may also use and bring your nasal spray as needed.    BRUSH YOUR TEETH MORNING OF SURGERY AND RINSE YOUR MOUTH OUT, NO CHEWING GUM CANDY OR MINTS.                                You may not have any metal on your body including hair pins and              piercings     Do not wear jewelry, make-up, lotions, powders or perfumes, deodorant              Do not wear nail polish.  Do not shave  48 hours prior to surgery.               Do not bring valuables to the hospital. [place].  Contacts, dentures or bridgework may not be worn into surgery.       _____________________________________________________________________             [facility] - Preparing for Surgery Before surgery, you can play an important role.  Because skin is not sterile, your skin needs to be as free of germs as possible.  You can reduce the number of germs on your skin by washing with CHG (chlorahexidine gluconate) soap before surgery.  CHG is an antiseptic cleaner which kills germs and bonds with the skin to continue killing germs even after washing. Please DO NOT use if you have an allergy to CHG or antibacterial soaps.  If your skin becomes reddened/irritated stop using the CHG and inform your nurse when you arrive at Short Stay. Do not shave (including legs and underarms) for at least 48 hours prior to the first CHG shower.  You may shave your face/neck. Please follow these instructions carefully:  1.  Shower with CHG Soap the night before surgery and the  morning of Surgery.  2.  If you choose to wash your hair, wash your hair first as usual with your  normal  shampoo.  3.  After you shampoo, rinse your hair and body thoroughly to remove the  shampoo.  4.  Use CHG as you would any other  liquid soap.  You can apply chg directly  to the skin and wash                       Gently with a scrungie or clean washcloth.  5.  Apply the CHG Soap to your body ONLY FROM THE NECK DOWN.   Do not use on face/ open                           Wound or open sores. Avoid contact with eyes, ears mouth and genitals (private parts).                       Wash face,  Genitals (private parts) with your normal soap.             6.  Wash thoroughly, paying special attention to the area where your surgery  will be performed.  7.  Thoroughly rinse your body with warm water from the neck down.  8.  DO NOT shower/wash with your normal soap after using and rinsing off  the CHG Soap.                9.  Pat yourself dry with a clean towel.            10.  Wear clean pajamas.            11.  Place clean sheets on your bed the night of your first shower and do not  sleep with pets. Day of Surgery : Do not apply any lotions/deodorants the morning of surgery.  Please wear clean clothes to the hospital/surgery center.  FAILURE TO FOLLOW THESE INSTRUCTIONS MAY RESULT IN THE CANCELLATION OF YOUR SURGERY PATIENT SIGNATURE_________________________________  NURSE SIGNATURE__________________________________  ________________________________________________________________________   Tanya [name]  An incentive spirometer is a tool that can help keep your lungs clear and active. This tool measures how well you are filling your lungs with each breath. Taking long deep breaths may help reverse or decrease the chance of developing breathing (pulmonary) problems (especially infection) following:  A long period of time when you are unable to move or be active. BEFORE THE PROCEDURE   If the spirometer includes an indicator to show your best effort, your nurse or respiratory therapist will set it to a desired goal.  If possible, sit up straight or lean slightly forward. Try not to slouch.  Hold the incentive  spirometer in an upright position. INSTRUCTIONS FOR USE  1. Sit on the edge of your bed if possible, or sit up as far as you can in bed or on a chair. 2. Hold the incentive spirometer in an upright position. 3. Breathe out normally. 4. Place the mouthpiece in your mouth and seal your lips tightly around it. 5. Breathe in slowly and as deeply as possible, raising the piston or the ball toward the top of the column. 6. Hold your breath for 3-5 seconds or for as long as possible. Allow the piston or ball to fall to the bottom of the column. 7. Remove the mouthpiece from your mouth and breathe out normally. 8. Rest for a few seconds and repeat Steps 1 through 7 at least 10 times every 1-2 hours when you are awake. Take your time and take a few normal breaths between deep breaths. 9. The spirometer may include an indicator to  show your best effort. Use the indicator as a goal to work toward during each repetition. 10. After each set of 10 deep breaths, practice coughing to be sure your lungs are clear. If you have an incision (the cut made at the time of surgery), support your incision when coughing by placing a pillow or rolled up towels firmly against it. Once you are able to get out of bed, walk around indoors and cough well. You may stop using the incentive spirometer when instructed by your caregiver.  RISKS AND COMPLICATIONS  Take your time so you do not get dizzy or light-headed.  If you are in pain, you may need to take or ask for pain medication before doing incentive spirometry. It is harder to take a deep breath if you are having pain. AFTER USE  Rest and breathe slowly and easily.  It can be helpful to keep track of a log of your progress. Your caregiver can provide you with a simple table to help with this. If you are using the spirometer at home, follow these instructions: [place] IF:   You are having difficultly using the spirometer.  You have trouble using the  spirometer as often as instructed.  Your pain medication is not giving enough relief while using the spirometer.  You develop fever of 100.5 F (38.1 C) or higher. SEEK IMMEDIATE MEDICAL CARE IF:   You cough up bloody sputum that had not been present before.  You develop fever of 102 F (38.9 C) or greater.  You develop worsening pain at or near the incision site. MAKE SURE YOU:   Understand these instructions.  Will watch your condition.  Will get help right away if you are not doing well or get worse. Document Released: 11/11/2006 Document Revised: 09/23/2011 Document Reviewed: 01/12/2007 Exit[employer] Patient Information 2014 [employer], [place].   ________________________________________________________________________  WHAT IS A BLOOD TRANSFUSION? Blood Transfusion Information  A transfusion is the replacement of blood or some of its parts. Blood is made up of multiple cells which provide different functions.  Red blood cells carry oxygen and are used for blood loss replacement.  White blood cells fight against infection.  Platelets control bleeding.  Plasma helps clot blood.  Other blood products are available for specialized needs, such as hemophilia or other clotting disorders. BEFORE THE TRANSFUSION  Who gives blood for transfusions?   Healthy volunteers who are fully evaluated to make sure their blood is safe. This is blood bank blood. Transfusion therapy is the safest it has ever been in the practice of medicine. Before blood is taken from a donor, a complete history is taken to make sure that person has no history of diseases nor engages in risky social behavior (examples are intravenous drug use or sexual activity with multiple partners). The donor's travel history is screened to minimize risk of transmitting infections, such as malaria. The donated blood is tested for signs of infectious diseases, such as HIV and hepatitis. The blood is then tested to be sure it is  compatible with you in order to minimize the chance of a transfusion reaction. If you or a relative donates blood, this is often done in anticipation of surgery and is not appropriate for emergency situations. It takes many days to process the donated blood. RISKS AND COMPLICATIONS Although transfusion therapy is very safe and saves many lives, the main dangers of transfusion include:   Getting an infectious disease.  Developing a transfusion reaction. This is an allergic reaction  to something in the blood you were given. Every precaution is taken to prevent this. The decision to have a blood transfusion has been considered carefully by your caregiver before blood is given. Blood is not given unless the benefits outweigh the risks. AFTER THE TRANSFUSION  Right after receiving a blood transfusion, you will usually feel much better and more energetic. This is especially true if your red blood cells have gotten low (anemic). The transfusion raises the level of the red blood cells which carry oxygen, and this usually causes an energy increase.  The nurse administering the transfusion will monitor you carefully for complications. HOME CARE INSTRUCTIONS  No special instructions are needed after a transfusion. You may find your energy is better. Speak with your caregiver about any limitations on activity for underlying diseases you may have. SEEK MEDICAL CARE IF:   Your condition is not improving after your transfusion.  You develop redness or irritation at the intravenous (IV) site. SEEK IMMEDIATE MEDICAL CARE IF:  Any of the following symptoms occur over the next 12 hours:  Shaking chills.  You have a temperature by mouth above 102 F (38.9 C), not controlled by medicine.  Chest, back, or muscle pain.  People around you feel you are not acting correctly or are confused.  Shortness of breath or difficulty breathing.  Dizziness and fainting.  You get a rash or develop hives.  You have  a decrease in urine output.  Your urine turns a dark color or changes to pink, red, or brown. Any of the following symptoms occur over the next 10 days:  You have a temperature by mouth above 102 F (38.9 C), not controlled by medicine.  Shortness of breath.  Weakness after normal activity.  The white part of the eye turns yellow (jaundice).  You have a decrease in the amount of urine or are urinating less often.  Your urine turns a dark color or changes to pink, red, or brown. Document Released: 06/28/2000 Document Revised: 09/23/2011 Document Reviewed: 02/15/2008 [facility] Patient Information 2014 [place], [place].  _______________________________________________________________________

## 2019-01-28 ENCOUNTER — Encounter (HOSPITAL_COMMUNITY): Payer: Self-pay

## 2019-01-28 ENCOUNTER — Encounter (HOSPITAL_COMMUNITY)
Admission: RE | Admit: 2019-01-28 | Discharge: 2019-01-28 | Disposition: A | Discharge status: Home / Self Care | Payer: Medicare HMO | Source: Ambulatory Visit | Attending: Orthopedic Surgery | Admitting: Orthopedic Surgery

## 2019-01-28 ENCOUNTER — Other Ambulatory Visit (HOSPITAL_COMMUNITY)
Admission: RE | Admit: 2019-01-28 | Discharge: 2019-01-28 | Disposition: A | Discharge status: Home / Self Care | Payer: Medicare HMO | Source: Ambulatory Visit | Attending: Orthopedic Surgery | Admitting: Orthopedic Surgery

## 2019-01-28 ENCOUNTER — Other Ambulatory Visit: Payer: Self-pay

## 2019-01-28 DIAGNOSIS — Z1159 Encounter for screening for other viral diseases: Secondary | ICD-10-CM | POA: Diagnosis not present

## 2019-01-28 DIAGNOSIS — M1712 Unilateral primary osteoarthritis, left knee: Secondary | ICD-10-CM | POA: Diagnosis not present

## 2019-01-28 DIAGNOSIS — Z01812 Encounter for preprocedural laboratory examination: Secondary | ICD-10-CM | POA: Diagnosis not present

## 2019-01-28 LAB — SURGICAL PCR SCREEN
MRSA, PCR: NEGATIVE
Staphylococcus aureus: NEGATIVE

## 2019-01-28 LAB — COMPREHENSIVE METABOLIC PANEL
ALT: 20 U/L (ref 0–44)
AST: 21 U/L (ref 15–41)
Albumin: 4.4 g/dL (ref 3.5–5.0)
Alkaline Phosphatase: 59 U/L (ref 38–126)
Anion gap: 11 (ref 5–15)
BUN: 15 mg/dL (ref 8–23)
CO2: 24 mmol/L (ref 22–32)
Calcium: 9.3 mg/dL (ref 8.9–10.3)
Chloride: 103 mmol/L (ref 98–111)
Creatinine, Ser: 0.78 mg/dL (ref 0.44–1.00)
GFR calc Af Amer: >60 mL/min (ref >60)
GFR calc non Af Amer: >60 mL/min (ref >60)
Glucose, Bld: 94 mg/dL (ref 70–99)
Potassium: 4.8 mmol/L (ref 3.5–5.1)
Sodium: 138 mmol/L (ref 135–145)
Total Bilirubin: 0.6 mg/dL (ref 0.3–1.2)
Total Protein: 7.5 g/dL (ref 6.5–8.1)

## 2019-01-28 LAB — HEMOGLOBIN A1C
Hgb A1c MFr Bld: 5.4 % (ref 4.8–5.6)
Mean Plasma Glucose: 108.28 mg/dL

## 2019-01-28 LAB — CBC
HCT: 44.3 % (ref 36.0–46.0)
Hemoglobin: 14.5 g/dL (ref 12.0–15.0)
MCH: 30.5 pg (ref 26.0–34.0)
MCHC: 32.7 g/dL (ref 30.0–36.0)
MCV: 93.3 fL (ref 80.0–100.0)
Platelets: 330 10*3/uL (ref 150–400)
RBC: 4.75 MIL/uL (ref 3.87–5.11)
RDW: 12.6 % (ref 11.5–15.5)
WBC: 7.6 10*3/uL (ref 4.0–10.5)
nRBC: 0 % (ref 0.0–0.2)

## 2019-01-28 LAB — PROTIME-INR
INR: 1 (ref 0.8–1.2)
Prothrombin Time: 13 seconds (ref 11.4–15.2)

## 2019-01-28 LAB — APTT: aPTT: 29 seconds (ref 24–36)

## 2019-01-29 LAB — SARS CORONAVIRUS 2 (TAT 6-24 HRS): SARS Coronavirus 2: NEGATIVE

## 2019-01-31 MED ORDER — BUPIVACAINE LIPOSOME 1.3 % IJ SUSP
20.0000 mL | Freq: Once | INTRAMUSCULAR | Status: DC
  Filled 2019-01-31: qty 20

## 2019-01-31 NOTE — Anesthesia Preprocedure Evaluation (Addendum)
Anesthesia Evaluation  Patient identified by MRN, date of birth, ID band Patient awake    Reviewed: Allergy & Precautions, NPO status , Patient's Chart, lab work & pertinent test results  History of Anesthesia Complications (+) PONV  Airway Mallampati: II  TM Distance: >3 FB Neck ROM: Full    Dental no notable dental hx. (+) Dental Advisory Given, Teeth Intact   Pulmonary neg pulmonary ROS,    Pulmonary exam normal breath sounds clear to auscultation       Cardiovascular negative cardio ROS Normal cardiovascular exam Rhythm:Regular Rate:Normal     Neuro/Psych negative neurological ROS  negative psych ROS   GI/Hepatic negative GI ROS, Neg liver ROS,   Endo/Other  Morbid obesity  Renal/GU negative Renal ROS  negative genitourinary   Musculoskeletal  (+) Arthritis ,   Abdominal   Peds  Hematology negative hematology ROS (+)   Anesthesia Other Findings   Reproductive/Obstetrics                            Anesthesia Physical Anesthesia Plan  ASA: III  Anesthesia Plan: Spinal and Regional   Post-op Pain Management:  Regional for Post-op pain   Induction:   PONV Risk Score and Plan: 3 and Treatment may vary due to age or medical condition, Propofol infusion, Ondansetron and Dexamethasone  Airway Management Planned: Natural Airway and Simple Face Mask  Additional Equipment:   Intra-op Plan:   Post-operative Plan:   Informed Consent: I have reviewed the patients History and Physical, chart, labs and discussed the procedure including the risks, benefits and alternatives for the proposed anesthesia with the patient or authorized representative who has indicated his/her understanding and acceptance.     Dental advisory given  Plan Discussed with: CRNA  Anesthesia Plan Comments:         Anesthesia Quick Evaluation

## 2019-02-01 ENCOUNTER — Encounter (HOSPITAL_COMMUNITY): Payer: Self-pay | Admitting: Emergency Medicine

## 2019-02-01 ENCOUNTER — Ambulatory Visit (HOSPITAL_COMMUNITY): Payer: Medicare HMO | Admitting: Anesthesiology

## 2019-02-01 ENCOUNTER — Observation Stay (HOSPITAL_COMMUNITY)
Admission: RE | Admit: 2019-02-01 | Discharge: 2019-02-02 | Disposition: A | Discharge status: Home / Self Care | Payer: Medicare HMO | Attending: Orthopedic Surgery | Admitting: Orthopedic Surgery

## 2019-02-01 ENCOUNTER — Ambulatory Visit (HOSPITAL_COMMUNITY): Payer: Medicare HMO | Admitting: Emergency Medicine

## 2019-02-01 ENCOUNTER — Other Ambulatory Visit: Payer: Self-pay

## 2019-02-01 ENCOUNTER — Encounter (HOSPITAL_COMMUNITY)
Admission: RE | Disposition: A | Discharge status: Home / Self Care | Payer: Self-pay | Source: Home / Self Care | Attending: Orthopedic Surgery

## 2019-02-01 DIAGNOSIS — G8918 Other acute postprocedural pain: Secondary | ICD-10-CM | POA: Diagnosis not present

## 2019-02-01 DIAGNOSIS — M1710 Unilateral primary osteoarthritis, unspecified knee: Secondary | ICD-10-CM | POA: Diagnosis present

## 2019-02-01 DIAGNOSIS — Z85828 Personal history of other malignant neoplasm of skin: Secondary | ICD-10-CM | POA: Insufficient documentation

## 2019-02-01 DIAGNOSIS — M25762 Osteophyte, left knee: Secondary | ICD-10-CM | POA: Insufficient documentation

## 2019-02-01 DIAGNOSIS — Z853 Personal history of malignant neoplasm of breast: Secondary | ICD-10-CM | POA: Insufficient documentation

## 2019-02-01 DIAGNOSIS — E785 Hyperlipidemia, unspecified: Secondary | ICD-10-CM | POA: Insufficient documentation

## 2019-02-01 DIAGNOSIS — Z888 Allergy status to other drugs, medicaments and biological substances status: Secondary | ICD-10-CM | POA: Insufficient documentation

## 2019-02-01 DIAGNOSIS — M179 Osteoarthritis of knee, unspecified: Secondary | ICD-10-CM | POA: Diagnosis present

## 2019-02-01 DIAGNOSIS — Z6839 Body mass index (BMI) 39.0-39.9, adult: Secondary | ICD-10-CM | POA: Insufficient documentation

## 2019-02-01 DIAGNOSIS — Z96651 Presence of right artificial knee joint: Secondary | ICD-10-CM | POA: Diagnosis not present

## 2019-02-01 DIAGNOSIS — Z885 Allergy status to narcotic agent status: Secondary | ICD-10-CM | POA: Diagnosis not present

## 2019-02-01 DIAGNOSIS — Z9013 Acquired absence of bilateral breasts and nipples: Secondary | ICD-10-CM | POA: Diagnosis not present

## 2019-02-01 DIAGNOSIS — Z79899 Other long term (current) drug therapy: Secondary | ICD-10-CM | POA: Insufficient documentation

## 2019-02-01 DIAGNOSIS — E559 Vitamin D deficiency, unspecified: Secondary | ICD-10-CM | POA: Insufficient documentation

## 2019-02-01 DIAGNOSIS — M1712 Unilateral primary osteoarthritis, left knee: Principal | ICD-10-CM | POA: Insufficient documentation

## 2019-02-01 DIAGNOSIS — M8580 Other specified disorders of bone density and structure, unspecified site: Secondary | ICD-10-CM | POA: Diagnosis not present

## 2019-02-01 DIAGNOSIS — E119 Type 2 diabetes mellitus without complications: Secondary | ICD-10-CM | POA: Diagnosis not present

## 2019-02-01 HISTORY — PX: TOTAL KNEE ARTHROPLASTY: SHX125

## 2019-02-01 LAB — TYPE AND SCREEN
ABO/RH(D): O POS
Antibody Screen: NEGATIVE

## 2019-02-01 LAB — GLUCOSE, CAPILLARY: Glucose-Capillary: 94 mg/dL (ref 70–99)

## 2019-02-01 SURGERY — ARTHROPLASTY, KNEE, TOTAL
Anesthesia: Regional | Site: Knee | Laterality: Left

## 2019-02-01 MED ORDER — CEFAZOLIN SODIUM-DEXTROSE 2-4 GM/100ML-% IV SOLN
2.0000 g | Freq: Four times a day (QID) | INTRAVENOUS | Status: AC
  Administered 2019-02-01 (×2): 2 g via INTRAVENOUS
  Filled 2019-02-01 (×2): qty 100

## 2019-02-01 MED ORDER — CYCLOBENZAPRINE HCL 10 MG PO TABS
10.0000 mg | ORAL_TABLET | Freq: Three times a day (TID) | ORAL | Status: DC | PRN
  Administered 2019-02-01 – 2019-02-02 (×4): 10 mg via ORAL
  Filled 2019-02-01 (×5): qty 1

## 2019-02-01 MED ORDER — PROPOFOL 10 MG/ML IV BOLUS
INTRAVENOUS | Status: AC
  Filled 2019-02-01: qty 40

## 2019-02-01 MED ORDER — SIMVASTATIN 20 MG PO TABS
20.0000 mg | ORAL_TABLET | Freq: Every evening | ORAL | Status: DC
  Administered 2019-02-01: 20 mg via ORAL
  Filled 2019-02-01: qty 1

## 2019-02-01 MED ORDER — DOCUSATE SODIUM 100 MG PO CAPS
100.0000 mg | ORAL_CAPSULE | Freq: Two times a day (BID) | ORAL | Status: DC
  Administered 2019-02-01 – 2019-02-02 (×2): 100 mg via ORAL
  Filled 2019-02-01 (×2): qty 1

## 2019-02-01 MED ORDER — LIDOCAINE 2% (20 MG/ML) 5 ML SYRINGE
INTRAMUSCULAR | Status: DC | PRN
  Administered 2019-02-01: 50 mg via INTRAVENOUS

## 2019-02-01 MED ORDER — ONDANSETRON HCL 4 MG/2ML IJ SOLN
INTRAMUSCULAR | Status: AC
  Filled 2019-02-01: qty 2

## 2019-02-01 MED ORDER — POLYETHYLENE GLYCOL 3350 17 G PO PACK: 17.0000 g | PACK | Freq: Every day | ORAL | Status: DC | PRN

## 2019-02-01 MED ORDER — DIPHENHYDRAMINE HCL 12.5 MG/5ML PO ELIX: 12.5000 mg | ORAL_SOLUTION | ORAL | Status: DC | PRN

## 2019-02-01 MED ORDER — PROPOFOL 10 MG/ML IV BOLUS
INTRAVENOUS | Status: AC
  Filled 2019-02-01: qty 20

## 2019-02-01 MED ORDER — MORPHINE SULFATE (PF) 2 MG/ML IV SOLN: 1.0000 mg | INTRAVENOUS | Status: DC | PRN

## 2019-02-01 MED ORDER — ONDANSETRON HCL 4 MG/2ML IJ SOLN: 4.0000 mg | Freq: Four times a day (QID) | INTRAMUSCULAR | Status: DC | PRN

## 2019-02-01 MED ORDER — BUPIVACAINE IN DEXTROSE 0.75-8.25 % IT SOLN
INTRATHECAL | Status: DC | PRN
  Administered 2019-02-01: 1.4 mL via INTRATHECAL

## 2019-02-01 MED ORDER — LACTATED RINGERS IV SOLN
INTRAVENOUS | Status: DC
  Administered 2019-02-01: 06:00:00 via INTRAVENOUS

## 2019-02-01 MED ORDER — PROPOFOL 500 MG/50ML IV EMUL
INTRAVENOUS | Status: DC | PRN
  Administered 2019-02-01: 75 ug/kg/min via INTRAVENOUS

## 2019-02-01 MED ORDER — 0.9 % SODIUM CHLORIDE (POUR BTL) OPTIME
TOPICAL | Status: DC | PRN
  Administered 2019-02-01: 1000 mL

## 2019-02-01 MED ORDER — IPRATROPIUM BROMIDE 0.06 % NA SOLN
1.0000 | Freq: Two times a day (BID) | NASAL | Status: DC
  Administered 2019-02-01: 21:00:00 1 via NASAL
  Filled 2019-02-01: qty 15

## 2019-02-01 MED ORDER — POVIDONE-IODINE 10 % EX SWAB
2.0000 "application " | Freq: Once | CUTANEOUS | Status: AC
  Administered 2019-02-01: 2 via TOPICAL

## 2019-02-01 MED ORDER — DEXAMETHASONE SODIUM PHOSPHATE 10 MG/ML IJ SOLN
8.0000 mg | Freq: Once | INTRAMUSCULAR | Status: AC
  Administered 2019-02-01: 08:00:00 8 mg via INTRAVENOUS

## 2019-02-01 MED ORDER — FLUTICASONE PROPIONATE 50 MCG/ACT NA SUSP
1.0000 | Freq: Every day | NASAL | Status: DC
  Filled 2019-02-01: qty 16

## 2019-02-01 MED ORDER — METOCLOPRAMIDE HCL 5 MG/ML IJ SOLN: 5.0000 mg | Freq: Three times a day (TID) | INTRAMUSCULAR | Status: DC | PRN

## 2019-02-01 MED ORDER — LIDOCAINE 2% (20 MG/ML) 5 ML SYRINGE
INTRAMUSCULAR | Status: AC
  Filled 2019-02-01: qty 5

## 2019-02-01 MED ORDER — CEFAZOLIN SODIUM-DEXTROSE 2-4 GM/100ML-% IV SOLN
2.0000 g | INTRAVENOUS | Status: AC
  Administered 2019-02-01: 07:00:00 2 g via INTRAVENOUS
  Filled 2019-02-01: qty 100

## 2019-02-01 MED ORDER — GABAPENTIN 300 MG PO CAPS
300.0000 mg | ORAL_CAPSULE | Freq: Three times a day (TID) | ORAL | Status: DC
  Administered 2019-02-01 – 2019-02-02 (×3): 300 mg via ORAL
  Filled 2019-02-01 (×3): qty 1

## 2019-02-01 MED ORDER — OXYCODONE HCL 5 MG PO TABS
5.0000 mg | ORAL_TABLET | ORAL | Status: DC | PRN
  Administered 2019-02-01: 21:00:00 10 mg via ORAL
  Administered 2019-02-01 – 2019-02-02 (×5): 5 mg via ORAL
  Filled 2019-02-01: qty 2
  Filled 2019-02-01: qty 1
  Filled 2019-02-01 (×2): qty 2
  Filled 2019-02-01 (×3): qty 1

## 2019-02-01 MED ORDER — RIVAROXABAN 10 MG PO TABS
10.0000 mg | ORAL_TABLET | Freq: Every day | ORAL | Status: DC
  Administered 2019-02-02: 10 mg via ORAL
  Filled 2019-02-01: qty 1

## 2019-02-01 MED ORDER — CHLORHEXIDINE GLUCONATE 4 % EX LIQD: 60.0000 mL | Freq: Once | CUTANEOUS | Status: DC

## 2019-02-01 MED ORDER — SODIUM CHLORIDE (PF) 0.9 % IJ SOLN
INTRAMUSCULAR | Status: AC
  Filled 2019-02-01: qty 50

## 2019-02-01 MED ORDER — ONDANSETRON HCL 4 MG PO TABS: 4.0000 mg | ORAL_TABLET | Freq: Four times a day (QID) | ORAL | Status: DC | PRN

## 2019-02-01 MED ORDER — BUPIVACAINE-EPINEPHRINE (PF) 0.5% -1:200000 IJ SOLN
INTRAMUSCULAR | Status: DC | PRN
  Administered 2019-02-01: 20 mL via PERINEURAL

## 2019-02-01 MED ORDER — METOCLOPRAMIDE HCL 5 MG PO TABS: 5.0000 mg | ORAL_TABLET | Freq: Three times a day (TID) | ORAL | Status: DC | PRN

## 2019-02-01 MED ORDER — SODIUM CHLORIDE 0.9 % IR SOLN
Status: DC | PRN
  Administered 2019-02-01: 1000 mL

## 2019-02-01 MED ORDER — ACETAMINOPHEN 500 MG PO TABS
1000.0000 mg | ORAL_TABLET | Freq: Four times a day (QID) | ORAL | Status: AC
  Administered 2019-02-01 (×3): 1000 mg via ORAL
  Filled 2019-02-01 (×4): qty 2

## 2019-02-01 MED ORDER — FENTANYL CITRATE (PF) 100 MCG/2ML IJ SOLN
INTRAMUSCULAR | Status: DC | PRN
  Administered 2019-02-01: 100 ug via INTRAVENOUS

## 2019-02-01 MED ORDER — SODIUM CHLORIDE (PF) 0.9 % IJ SOLN
INTRAMUSCULAR | Status: DC | PRN
  Administered 2019-02-01: 60 mL

## 2019-02-01 MED ORDER — BISACODYL 10 MG RE SUPP: 10.0000 mg | Freq: Every day | RECTAL | Status: DC | PRN

## 2019-02-01 MED ORDER — MENTHOL 3 MG MT LOZG: 1.0000 | LOZENGE | OROMUCOSAL | Status: DC | PRN

## 2019-02-01 MED ORDER — ACETAMINOPHEN 500 MG PO TABS: 1000.0000 mg | ORAL_TABLET | Freq: Once | ORAL | Status: DC

## 2019-02-01 MED ORDER — FENTANYL CITRATE (PF) 100 MCG/2ML IJ SOLN: 25.0000 ug | INTRAMUSCULAR | Status: DC | PRN

## 2019-02-01 MED ORDER — SODIUM CHLORIDE 0.9 % IV SOLN
INTRAVENOUS | Status: DC
  Administered 2019-02-01 (×2): via INTRAVENOUS

## 2019-02-01 MED ORDER — DEXAMETHASONE SODIUM PHOSPHATE 10 MG/ML IJ SOLN
INTRAMUSCULAR | Status: AC
  Filled 2019-02-01: qty 1

## 2019-02-01 MED ORDER — LEVOCETIRIZINE DIHYDROCHLORIDE 5 MG PO TABS: 5.0000 mg | ORAL_TABLET | Freq: Every evening | ORAL | Status: DC

## 2019-02-01 MED ORDER — FENTANYL CITRATE (PF) 100 MCG/2ML IJ SOLN
INTRAMUSCULAR | Status: AC
  Filled 2019-02-01: qty 2

## 2019-02-01 MED ORDER — ONDANSETRON HCL 4 MG/2ML IJ SOLN
INTRAMUSCULAR | Status: DC | PRN
  Administered 2019-02-01: 4 mg via INTRAVENOUS

## 2019-02-01 MED ORDER — FLEET ENEMA 7-19 GM/118ML RE ENEM: 1.0000 | ENEMA | Freq: Once | RECTAL | Status: DC | PRN

## 2019-02-01 MED ORDER — SODIUM CHLORIDE (PF) 0.9 % IJ SOLN
INTRAMUSCULAR | Status: AC
  Filled 2019-02-01: qty 10

## 2019-02-01 MED ORDER — LORATADINE 10 MG PO TABS
10.0000 mg | ORAL_TABLET | Freq: Every day | ORAL | Status: DC
  Administered 2019-02-01: 10 mg via ORAL
  Filled 2019-02-01: qty 1

## 2019-02-01 MED ORDER — PROPOFOL 10 MG/ML IV BOLUS
INTRAVENOUS | Status: DC | PRN
  Administered 2019-02-01 (×2): 20 mg via INTRAVENOUS

## 2019-02-01 MED ORDER — BUPIVACAINE LIPOSOME 1.3 % IJ SUSP
INTRAMUSCULAR | Status: DC | PRN
  Administered 2019-02-01: 20 mL

## 2019-02-01 MED ORDER — ACETAMINOPHEN 10 MG/ML IV SOLN
1000.0000 mg | Freq: Four times a day (QID) | INTRAVENOUS | Status: DC
  Administered 2019-02-01: 08:00:00 1000 mg via INTRAVENOUS
  Filled 2019-02-01: qty 100

## 2019-02-01 MED ORDER — PHENOL 1.4 % MT LIQD
1.0000 | OROMUCOSAL | Status: DC | PRN
  Filled 2019-02-01: qty 177

## 2019-02-01 MED ORDER — CLONIDINE HCL (ANALGESIA) 100 MCG/ML EP SOLN
EPIDURAL | Status: DC | PRN
  Administered 2019-02-01: 100 ug

## 2019-02-01 MED ORDER — TRAMADOL HCL 50 MG PO TABS
50.0000 mg | ORAL_TABLET | Freq: Four times a day (QID) | ORAL | Status: DC | PRN
  Administered 2019-02-01 (×2): 50 mg via ORAL
  Administered 2019-02-02: 06:00:00 100 mg via ORAL
  Filled 2019-02-01: qty 2
  Filled 2019-02-01 (×2): qty 1

## 2019-02-01 MED ORDER — TRANEXAMIC ACID-NACL 1000-0.7 MG/100ML-% IV SOLN
1000.0000 mg | INTRAVENOUS | Status: AC
  Administered 2019-02-01: 1000 mg via INTRAVENOUS
  Filled 2019-02-01: qty 100

## 2019-02-01 SURGICAL SUPPLY — 61 items
BAG SPEC THK2 15X12 ZIP CLS (MISCELLANEOUS) ×1
BAG ZIPLOCK 12X15 (MISCELLANEOUS) ×3 IMPLANT
BLADE SAG 18X100X1.27 (BLADE) ×3 IMPLANT
BLADE SAW SGTL 11.0X1.19X90.0M (BLADE) ×3 IMPLANT
BLADE SURG SZ10 CARB STEEL (BLADE) ×6 IMPLANT
BNDG ELASTIC 6X5.8 VLCR STR LF (GAUZE/BANDAGES/DRESSINGS) ×3 IMPLANT
BOWL SMART MIX CTS (DISPOSABLE) ×3 IMPLANT
CEMENT HV SMART SET (Cement) ×6 IMPLANT
CEMENT TIBIA MBT SIZE 4 (Knees) IMPLANT
CLOSURE STERI-STRIP 1/2X4 (GAUZE/BANDAGES/DRESSINGS) ×2
CLOSURE WOUND 1/2 X4 (GAUZE/BANDAGES/DRESSINGS)
CLSR STERI-STRIP ANTIMIC 1/2X4 (GAUZE/BANDAGES/DRESSINGS) ×2 IMPLANT
COVER SURGICAL LIGHT HANDLE (MISCELLANEOUS) ×3 IMPLANT
COVER WAND RF STERILE (DRAPES) IMPLANT
CUFF TOURN SGL QUICK 34 (TOURNIQUET CUFF) ×3
CUFF TRNQT CYL 34X4.125X (TOURNIQUET CUFF) ×1 IMPLANT
DECANTER SPIKE VIAL GLASS SM (MISCELLANEOUS) ×5 IMPLANT
DRAPE U-SHAPE 47X51 STRL (DRAPES) ×3 IMPLANT
DRSG ADAPTIC 3X8 NADH LF (GAUZE/BANDAGES/DRESSINGS) ×3 IMPLANT
DRSG PAD ABDOMINAL 8X10 ST (GAUZE/BANDAGES/DRESSINGS) ×3 IMPLANT
DURAPREP 26ML APPLICATOR (WOUND CARE) ×3 IMPLANT
ELECT REM PT RETURN 15FT ADLT (MISCELLANEOUS) ×3 IMPLANT
EVACUATOR 1/8 PVC DRAIN (DRAIN) ×3 IMPLANT
FEMUR SIGMA PS KNEE SZ 4.0N L (Femur) ×2 IMPLANT
GAUZE SPONGE 4X4 12PLY STRL (GAUZE/BANDAGES/DRESSINGS) ×3 IMPLANT
GLOVE BIO SURGEON STRL SZ7 (GLOVE) ×3 IMPLANT
GLOVE BIO SURGEON STRL SZ8 (GLOVE) ×3 IMPLANT
GLOVE BIOGEL PI IND STRL 6.5 (GLOVE) ×1 IMPLANT
GLOVE BIOGEL PI IND STRL 7.0 (GLOVE) ×1 IMPLANT
GLOVE BIOGEL PI IND STRL 8 (GLOVE) ×1 IMPLANT
GLOVE BIOGEL PI INDICATOR 6.5 (GLOVE) ×2
GLOVE BIOGEL PI INDICATOR 7.0 (GLOVE) ×2
GLOVE BIOGEL PI INDICATOR 8 (GLOVE) ×2
GLOVE SURG SS PI 6.5 STRL IVOR (GLOVE) ×3 IMPLANT
GOWN STRL REUS W/TWL LRG LVL3 (GOWN DISPOSABLE) ×9 IMPLANT
HANDPIECE INTERPULSE COAX TIP (DISPOSABLE) ×3
HOLDER FOLEY CATH W/STRAP (MISCELLANEOUS) ×2 IMPLANT
IMMOBILIZER KNEE 20 (SOFTGOODS) ×3
IMMOBILIZER KNEE 20 THIGH 36 (SOFTGOODS) ×1 IMPLANT
KIT TURNOVER KIT A (KITS) ×2 IMPLANT
MANIFOLD NEPTUNE II (INSTRUMENTS) ×3 IMPLANT
NS IRRIG 1000ML POUR BTL (IV SOLUTION) ×1 IMPLANT
PACK TOTAL KNEE CUSTOM (KITS) ×3 IMPLANT
PADDING CAST COTTON 6X4 STRL (CAST SUPPLIES) ×7 IMPLANT
PATELLA DOME PFC 35MM (Knees) ×2 IMPLANT
PIN STEINMAN FIXATION KNEE (PIN) ×2 IMPLANT
PLATE ROT INSERT 15MM SIZE 4 (Plate) ×2 IMPLANT
PROTECTOR NERVE ULNAR (MISCELLANEOUS) ×3 IMPLANT
SET HNDPC FAN SPRY TIP SCT (DISPOSABLE) ×1 IMPLANT
STRIP CLOSURE SKIN 1/2X4 (GAUZE/BANDAGES/DRESSINGS) ×2 IMPLANT
SUT MNCRL AB 4-0 PS2 18 (SUTURE) ×3 IMPLANT
SUT STRATAFIX 0 PDS 27 VIOLET (SUTURE) ×3
SUT VIC AB 2-0 CT1 27 (SUTURE) ×9
SUT VIC AB 2-0 CT1 TAPERPNT 27 (SUTURE) ×3 IMPLANT
SUTURE STRATFX 0 PDS 27 VIOLET (SUTURE) ×1 IMPLANT
TIBIA MBT CEMENT SIZE 4 (Knees) ×3 IMPLANT
TRAY FOLEY MTR SLVR 14FR STAT (SET/KITS/TRAYS/PACK) ×2 IMPLANT
TRAY FOLEY MTR SLVR 16FR STAT (SET/KITS/TRAYS/PACK) ×1 IMPLANT
WATER STERILE IRR 1000ML POUR (IV SOLUTION) ×6 IMPLANT
WRAP KNEE MAXI GEL POST OP (GAUZE/BANDAGES/DRESSINGS) ×3 IMPLANT
YANKAUER SUCT BULB TIP 10FT TU (MISCELLANEOUS) ×3 IMPLANT

## 2019-02-01 NOTE — Interval H&P Note (Signed)
History and Physical Interval Note:  02/01/2019 6:40 AM  Tanya [name]  has presented today for surgery, with the diagnosis of left knee osteoarthritis.  The various methods of treatment have been discussed with the patient and family. After consideration of risks, benefits and other options for treatment, the patient has consented to  Procedure(s) with comments: TOTAL KNEE ARTHROPLASTY (Left) - 54min as a surgical intervention.  The patient's history has been reviewed, patient examined, no change in status, stable for surgery.  I have reviewed the patient's chart and labs.  Questions were answered to the patient's satisfaction.     Pilar Plate Latressa Harries

## 2019-02-01 NOTE — Anesthesia Postprocedure Evaluation (Signed)
Anesthesia Post Note  Patient: Tanya [name]  Procedure(s) Performed: TOTAL KNEE ARTHROPLASTY (Left Knee)     Patient location during evaluation: PACU Anesthesia Type: Regional and Spinal Level of consciousness: oriented and awake and alert Pain management: pain level controlled Vital Signs Assessment: post-procedure vital signs reviewed and stable Respiratory status: spontaneous breathing, respiratory function stable and patient connected to nasal cannula oxygen Cardiovascular status: blood pressure returned to baseline and stable Postop Assessment: no headache, no backache and no apparent nausea or vomiting Anesthetic complications: no    Last Vitals:  Vitals:   02/01/19 0915 02/01/19 0942  BP: 131/85 137/87  Pulse: 76 73  Resp: 16 15  Temp:    SpO2: 99% 98%    Last Pain:  Vitals:   02/01/19 0942  TempSrc:   PainSc: 5                  Shamieka Gullo L Vega Stare

## 2019-02-01 NOTE — Evaluation (Signed)
Physical Therapy Evaluation Patient Details Name: Tanya [name] MRN: [id number] DOB: 05/13/1946 Today's Date: 02/01/2019   History of Present Illness  s/p L TKA; PMH: R TKA  Clinical Impression  Pt is s/p TKA resulting in the deficits listed below (see PT Problem List).  Amb 83' with RW and min/guard assist. Anticipate steady progress.   Pt will benefit from skilled PT to increase their independence and safety with mobility to allow discharge to the venue listed below.       Follow Up Recommendations Follow surgeon's recommendation for DC plan and follow-up therapies    Equipment Recommendations  None recommended by PT    Recommendations for Other Services       Precautions / Restrictions Precautions Precautions: Knee;Fall Required Braces or Orthoses: Knee Immobilizer - Left Knee Immobilizer - Left: Discontinue once straight leg raise with < 10 degree lag Restrictions Weight Bearing Restrictions: No      Mobility  Bed Mobility Overal bed mobility: Needs Assistance Bed Mobility: Supine to Sit     Supine to sit: Min assist     General bed mobility comments: assist with LLE  Transfers Overall transfer level: Needs assistance Equipment used: Rolling walker (2 wheeled) Transfers: Sit to/from Stand Sit to Stand: Min guard         General transfer comment: cues for hand placement and LLE position  Ambulation/Gait Ambulation/Gait assistance: Min guard;Min assist Gait Distance (Feet): 50 Feet Assistive device: Rolling walker (2 wheeled) Gait Pattern/deviations: Step-to pattern     General Gait Details: cues for sequence and RW position  Stairs            Wheelchair Mobility    Modified Rankin (Stroke Patients Only)       Balance                                             Pertinent Vitals/Pain Pain Assessment: 0-10 Pain Score: 3  Pain Location: left knee Pain Descriptors / Indicators: Discomfort Pain Intervention(s):  Limited activity within patient's tolerance;Monitored during session;Premedicated before session;Repositioned    Home Living Family/patient expects to be discharged to:: Private residence Living Arrangements: Spouse/significant other Available Help at Discharge: Family Type of Home: House Home Access: Stairs to enter   Technical brewer of Steps: 1 Home Layout: Two level;Able to live on main level with bedroom/bathroom Home Equipment: Gilford Rile - 2 wheels;Bedside commode;Tub bench      Prior Function Level of Independence: Independent               Hand Dominance        Extremity/Trunk Assessment   Upper Extremity Assessment Upper Extremity Assessment: Overall WFL for tasks assessed    Lower Extremity Assessment Lower Extremity Assessment: LLE deficits/detail LLE Deficits / Details: ankle WFL, knee extension and hip flexion 2+/5, AAROM grossly 8* to 55*  knee flexion       Communication   Communication: No difficulties  Cognition Arousal/Alertness: Awake/alert Behavior During Therapy: WFL for tasks assessed/performed Overall Cognitive Status: Within Functional Limits for tasks assessed                                        General Comments      Exercises Total Joint Exercises Ankle Circles/Pumps: AROM;10 reps;Both Quad Sets: 5  reps;AROM;Both   Assessment/Plan    PT Assessment Patient needs continued PT services  PT Problem List Decreased strength;Decreased range of motion;Decreased activity tolerance;Pain;Decreased mobility;Decreased knowledge of use of DME       PT Treatment Interventions DME instruction;Gait training;Functional mobility training;Therapeutic activities;Therapeutic exercise;Patient/family education;Stair training    PT Goals (Current goals can be found in the Care Plan section)  Acute Rehab PT Goals PT Goal Formulation: With patient Time For Goal Achievement: 02/08/19 Potential to Achieve Goals: Good     Frequency 7X/week   Barriers to discharge        Co-evaluation               AM-PAC PT "6 Clicks" Mobility  Outcome Measure Help needed turning from your back to your side while in a flat bed without using bedrails?: A Little Help needed moving from lying on your back to sitting on the side of a flat bed without using bedrails?: A Little Help needed moving to and from a bed to a chair (including a wheelchair)?: A Little Help needed standing up from a chair using your arms (e.g., wheelchair or bedside chair)?: A Little Help needed to walk in hospital room?: A Little Help needed climbing 3-5 steps with a railing? : A Little 6 Click Score: 18    End of Session Equipment Utilized During Treatment: Gait belt;Left knee immobilizer Activity Tolerance: Patient tolerated treatment well Patient left: in chair;with call bell/phone within reach;with chair alarm set   PT Visit Diagnosis: Difficulty in walking, not elsewhere classified (R26.2)    Time: 2111-7356 PT Time Calculation (min) (ACUTE ONLY): 19 min   Charges:   PT Evaluation $PT Eval Low Complexity: 1 Low          Kenyon Ana, PT  Pager: [phone] Acute Rehab Dept [facility]): [phone]   02/01/2019   [facility] 02/01/2019, 3:42 PM

## 2019-02-01 NOTE — Transfer of Care (Signed)
Immediate Anesthesia Transfer of Care Note  Patient: Tanya [name]  Procedure(s) Performed: TOTAL KNEE ARTHROPLASTY (Left Knee)  Patient Location: PACU  Anesthesia Type:Regional and Spinal  Level of Consciousness: awake, alert  and oriented  Airway & Oxygen Therapy: Patient Spontanous Breathing and Patient connected to nasal cannula oxygen  Post-op Assessment: Report given to RN and Post -op Vital signs reviewed and stable  Post vital signs: Reviewed and stable  Last Vitals:  Vitals Value Taken Time  BP 111/66 02/01/19 0840  Temp    Pulse 82 02/01/19 0842  Resp 19 02/01/19 0842  SpO2 94 % 02/01/19 0842  Vitals shown include unvalidated device data.  Last Pain:  Vitals:   02/01/19 0555  TempSrc: Oral  PainSc:       Patients Stated Pain Goal: 4 (27/67/01 1003)  Complications: No apparent anesthesia complications

## 2019-02-01 NOTE — Anesthesia Procedure Notes (Signed)
Spinal  Patient location during procedure: OR End time: 02/01/2019 7:18 AM Staffing Resident/CRNA: Noralyn Pick D, CRNA Performed: anesthesiologist and resident/CRNA  Preanesthetic Checklist Completed: patient identified, site marked, surgical consent, pre-op evaluation, timeout performed, IV checked, risks and benefits discussed and monitors and equipment checked Spinal Block Patient position: sitting Prep: Betadine Patient monitoring: heart rate, continuous pulse ox and blood pressure Approach: midline Location: L3-4 Injection technique: single-shot Needle Needle type: Sprotte  Needle gauge: 24 G Needle length: 9 cm Additional Notes Expiration date of kit checked and confirmed. Patient tolerated procedure well, without complications.

## 2019-02-01 NOTE — Discharge Instructions (Addendum)
° °Dr. Frank Aluisio °Total Joint Specialist °[facility] °[address] °[place], [place] [address] °[phone] ° °TOTAL KNEE REPLACEMENT POSTOPERATIVE DIRECTIONS ° °Knee Rehabilitation, Guidelines Following Surgery  °Results after knee surgery are often greatly improved when you follow the exercise, range of motion and muscle strengthening exercises prescribed by your doctor. Safety measures are also important to protect the knee from further injury. Any time any of these exercises cause you to have increased pain or swelling in your knee joint, decrease the amount until you are comfortable again and slowly increase them. If you have problems or questions, call your caregiver or physical therapist for advice.  ° °HOME CARE INSTRUCTIONS  °• Remove items at home which could result in a fall. This includes throw rugs or furniture in walking pathways.  °· ICE to the affected knee every three hours for 30 minutes at a time and then as needed for pain and swelling.  Continue to use ice on the knee for pain and swelling from surgery. You may notice swelling that will progress down to the foot and ankle.  This is normal after surgery.  Elevate the leg when you are not up walking on it.   °· Continue to use the breathing machine which will help keep your temperature down.  It is common for your temperature to cycle up and down following surgery, especially at night when you are not up moving around and exerting yourself.  The breathing machine keeps your lungs expanded and your temperature down. °· Do not place pillow under knee, focus on keeping the knee straight while resting ° °DIET °You may resume your previous home diet once your are discharged from the hospital. ° °DRESSING / WOUND CARE / SHOWERING °You may change your dressing 3-5 days after surgery.  Then change the dressing every day with sterile gauze.  Please use good hand washing techniques before changing the dressing.  Do not use any lotions  or creams on the incision until instructed by your surgeon. °You may start showering once you are discharged home but do not submerge the incision under water. Just pat the incision dry and apply a dry gauze dressing on daily. °Change the surgical dressing daily and reapply a dry dressing each time. ° °ACTIVITY °Walk with your walker as instructed. °Use walker as long as suggested by your caregivers. °Avoid periods of inactivity such as sitting longer than an hour when not asleep. This helps prevent blood clots.  °You may resume a sexual relationship in one month or when given the OK by your doctor.  °You may return to work once you are cleared by your doctor.  °Do not drive a car for 6 weeks or until released by you surgeon.  °Do not drive while taking narcotics. ° °WEIGHT BEARING °Weight bearing as tolerated with assist device (walker, cane, etc) as directed, use it as long as suggested by your surgeon or therapist, typically at least 4-6 weeks. ° °POSTOPERATIVE CONSTIPATION PROTOCOL °Constipation - defined medically as fewer than three stools per week and severe constipation as less than one stool per week. ° °One of the most common issues patients have following surgery is constipation.  Even if you have a regular bowel pattern at home, your normal regimen is likely to be disrupted due to multiple reasons following surgery.  Combination of anesthesia, postoperative narcotics, change in appetite and fluid intake all can affect your bowels.  In order to avoid complications following surgery, here are some   recommendations in order to help you during your recovery period. ° °Colace (docusate) - Pick up an over-the-counter form of Colace or another stool softener and take twice a day as long as you are requiring postoperative pain medications.  Take with a full glass of water daily.  If you experience loose stools or diarrhea, hold the colace until you stool forms back up.  If your symptoms do not get better within 1  week or if they get worse, check with your doctor. ° °Dulcolax (bisacodyl) - Pick up over-the-counter and take as directed by the product packaging as needed to assist with the movement of your bowels.  Take with a full glass of water.  Use this product as needed if not relieved by Colace only.  ° °MiraLax (polyethylene glycol) - Pick up over-the-counter to have on hand.  MiraLax is a solution that will increase the amount of water in your bowels to assist with bowel movements.  Take as directed and can mix with a glass of water, juice, soda, coffee, or tea.  Take if you go more than two days without a movement. °Do not use MiraLax more than once per day. Call your doctor if you are still constipated or irregular after using this medication for 7 days in a row. ° °If you continue to have problems with postoperative constipation, please contact the office for further assistance and recommendations.  If you experience "the worst abdominal pain ever" or develop nausea or vomiting, please contact the office immediatly for further recommendations for treatment. ° °ITCHING °If you experience itching with your medications, try taking only a single pain pill, or even half a pain pill at a time.  You can also use Benadryl over the counter for itching or also to help with sleep.  ° °TED HOSE STOCKINGS °Wear the elastic stockings on both legs for three weeks following surgery during the day but you may remove then at night for sleeping. ° °MEDICATIONS °See your medication summary on the “After Visit Summary” that the nursing staff will review with you prior to discharge.  You may have some home medications which will be placed on hold until you complete the course of blood thinner medication.  It is important for you to complete the blood thinner medication as prescribed by your surgeon.  Continue your approved medications as instructed at time of discharge. ° °PRECAUTIONS °If you experience chest pain or shortness of breath -  call 911 immediately for transfer to the hospital emergency department.  °If you develop a fever greater that 101 F, purulent drainage from wound, increased redness or drainage from wound, foul odor from the wound/dressing, or calf pain - CONTACT YOUR SURGEON.   °                                                °FOLLOW-UP APPOINTMENTS °Make sure you keep all of your appointments after your operation with your surgeon and caregivers. You should call the office at the above phone number and make an appointment for approximately two weeks after the date of your surgery or on the date instructed by your surgeon outlined in the "After Visit Summary". ° °RANGE OF MOTION AND STRENGTHENING EXERCISES  °Rehabilitation of the knee is important following a knee injury or an operation. After just a few days of immobilization, the muscles of   the thigh which control the knee become weakened and shrink (atrophy). Knee exercises are designed to build up the tone and strength of the thigh muscles and to improve knee motion. Often times heat used for twenty to thirty minutes before working out will loosen up your tissues and help with improving the range of motion but do not use heat for the first two weeks following surgery. These exercises can be done on a training (exercise) mat, on the floor, on a table or on a bed. Use what ever works the best and is most comfortable for you Knee exercises include:   Leg Lifts - While your knee is still immobilized in a splint or cast, you can do straight leg raises. Lift the leg to 60 degrees, hold for 3 sec, and slowly lower the leg. Repeat 10-20 times 2-3 times daily. Perform this exercise against resistance later as your knee gets better.   Quad and Hamstring Sets - Tighten up the muscle on the front of the thigh (Quad) and hold for 5-10 sec. Repeat this 10-20 times hourly. Hamstring sets are done by pushing the foot backward against an object and holding for 5-10 sec. Repeat as with quad  sets.   Leg Slides: Lying on your back, slowly slide your foot toward your buttocks, bending your knee up off the floor (only go as far as is comfortable). Then slowly slide your foot back down until your leg is flat on the floor again.  Angel Wings: Lying on your back spread your legs to the side as far apart as you can without causing discomfort.  A rehabilitation program following serious knee injuries can speed recovery and prevent re-injury in the future due to weakened muscles. Contact your doctor or a physical therapist for more information on knee rehabilitation.   IF YOU ARE TRANSFERRED TO A SKILLED REHAB FACILITY If the patient is transferred to a skilled rehab facility following release from the hospital, a list of the current medications will be sent to the facility for the patient to continue.  When discharged from the skilled rehab facility, please have the facility set up the patient's [place] prior to being released. Also, the skilled facility will be responsible for providing the patient with their medications at time of release from the facility to include their pain medication, the muscle relaxants, and their blood thinner medication. If the patient is still at the rehab facility at time of the two week follow up appointment, the skilled rehab facility will also need to assist the patient in arranging follow up appointment in our office and any transportation needs.  MAKE SURE YOU:   Understand these instructions.   Get help right away if you are not doing well or get worse.    Pick up stool softner and laxative for home use following surgery while on pain medications. Do not submerge incision under water. Please use good hand washing techniques while changing dressing each day. May shower starting three days after surgery. Please use a clean towel to pat the incision dry following showers. Continue to use ice for pain and swelling after surgery. Do not  use any lotions or creams on the incision until instructed by your surgeon.  Information on my medicine - XARELTO (Rivaroxaban)  This medication education was reviewed with me or my healthcare representative as part of my discharge preparation.  The pharmacist that spoke with me during my hospital stay was:   Why was Xarelto prescribed for  you? Xarelto was prescribed for you to reduce the risk of blood clots forming after orthopedic surgery. The medical term for these abnormal blood clots is venous thromboembolism (VTE).  What do you need to know about xarelto ? Take your Xarelto ONCE DAILY at the same time every day. You may take it either with or without food.  If you have difficulty swallowing the tablet whole, you may crush it and mix in applesauce just prior to taking your dose.  Take Xarelto exactly as prescribed by your doctor and DO NOT stop taking Xarelto without talking to the doctor who prescribed the medication.  Stopping without other VTE prevention medication to take the place of Xarelto may increase your risk of developing a clot.  After discharge, you should have regular check-up appointments with your healthcare provider that is prescribing your Xarelto.    What do you do if you miss a dose? If you miss a dose, take it as soon as you remember on the same day then continue your regularly scheduled once daily regimen the next day. Do not take two doses of Xarelto on the same day.   Important Safety Information A possible side effect of Xarelto is bleeding. You should call your healthcare provider right away if you experience any of the following: ? Bleeding from an injury or your nose that does not stop. ? Unusual colored urine (red or dark brown) or unusual colored stools (red or black). ? Unusual bruising for unknown reasons. ? A serious fall or if you hit your head (even if there is no bleeding).  Some medicines may interact with Xarelto and might increase  your risk of bleeding while on Xarelto. To help avoid this, consult your healthcare provider or pharmacist prior to using any new prescription or non-prescription medications, including herbals, vitamins, non-steroidal anti-inflammatory drugs (NSAIDs) and supplements.  This website has more information on Xarelto: [email or web address]

## 2019-02-01 NOTE — Anesthesia Procedure Notes (Signed)
Anesthesia Regional Block: Adductor canal block   Pre-Anesthetic Checklist: ,, timeout performed, Correct Patient, Correct Site, Correct Laterality, Correct Procedure, Correct Position, site marked, Risks and benefits discussed,  Surgical consent,  Pre-op evaluation,  At surgeon's request and post-op pain management  Laterality: Left  Prep: Maximum Sterile Barrier Precautions used, chloraprep       Needles:  Injection technique: Single-shot  Needle Type: Echogenic Stimulator Needle     Needle Length: 9cm  Needle Gauge: 22     Additional Needles:   Procedures:,,,, ultrasound used (permanent image in chart),,,,  Narrative:  Start time: 02/01/2019 6:49 AM End time: 02/01/2019 6:59 AM Injection made incrementally with aspirations every 5 mL.  Performed by: Personally  Anesthesiologist: Freddrick March, MD  Additional Notes: Monitors applied. No increased pain on injection. No increased resistance to injection. Injection made in 5cc increments. Good needle visualization. Patient tolerated procedure well.

## 2019-02-01 NOTE — Op Note (Signed)
OPERATIVE REPORT-TOTAL KNEE ARTHROPLASTY   Pre-operative diagnosis- Osteoarthritis  Left knee(s)  Post-operative diagnosis- Osteoarthritis Left knee(s)  Procedure-  Left  Total Knee Arthroplasty  Surgeon- Dione Plover. Martrice Apt, MD  Assistant- Amber COnstable, PA-C   Anesthesia-  Adductor canal block and spinal  EBL-50 mL   Drains Hemovac  Tourniquet time-  Total Tourniquet Time Documented: Thigh (Left) - 39 minutes Total: Thigh (Left) - 39 minutes     Complications- None  Condition-PACU - hemodynamically stable.   Brief Clinical Note  Tanya [name] is a 73 y.o. year old female with end stage OA of her left knee with progressively worsening pain and dysfunction. She has constant pain, with activity and at rest and significant functional deficits with difficulties even with ADLs. She has had extensive non-op management including analgesics, injections of cortisone and viscosupplements, and home exercise program, but remains in significant pain with significant dysfunction. Radiographs show bone on bone arthritis patellofemoral. She presents now for left Total Knee Arthroplasty.    Procedure in detail---   The patient is brought into the operating room and positioned supine on the operating table. After successful administration of  Adductor canal block and spinal,   a tourniquet is placed high on the  Left thigh(s) and the lower extremity is prepped and draped in the usual sterile fashion. Time out is performed by the operating team and then the  Left lower extremity is wrapped in Esmarch, knee flexed and the tourniquet inflated to 300 mmHg.       A midline incision is made with a ten blade through the subcutaneous tissue to the level of the extensor mechanism. A fresh blade is used to make a medial parapatellar arthrotomy. Soft tissue over the proximal medial tibia is subperiosteally elevated to the joint line with a knife and into the semimembranosus bursa with a Cobb elevator. Soft  tissue over the proximal lateral tibia is elevated with attention being paid to avoiding the patellar tendon on the tibial tubercle. The patella is everted, knee flexed 90 degrees and the ACL and PCL are removed. Findings are exposed bone all 3 compartments with patellofemoral bone on bone and large marginal osteophytes.        The drill is used to create a starting hole in the distal femur and the canal is thoroughly irrigated with sterile saline to remove the fatty contents. The 5 degree Left  valgus alignment guide is placed into the femoral canal and the distal femoral cutting block is pinned to remove 10 mm off the distal femur. Resection is made with an oscillating saw.      The tibia is subluxed forward and the menisci are removed. The extramedullary alignment guide is placed referencing proximally at the medial aspect of the tibial tubercle and distally along the second metatarsal axis and tibial crest. The block is pinned to remove 64mm off the more deficient medial  side. Resection is made with an oscillating saw. Size 4is the most appropriate size for the tibia and the proximal tibia is prepared with the modular drill and keel punch for that size.      The femoral sizing guide is placed and size 4 is most appropriate. Rotation is marked off the epicondylar axis and confirmed by creating a rectangular flexion gap at 90 degrees. The size 4 cutting block is pinned in this rotation and the anterior, posterior and chamfer cuts are made with the oscillating saw. The intercondylar block is then placed and that cut is  made.      Trial size 4 tibial component, trial size 4 narrow posterior stabilized femur and a 15  mm posterior stabilized rotating platform insert trial is placed. Full extension is achieved with excellent varus/valgus and anterior/posterior balance throughout full range of motion. The patella is everted and thickness measured to be 24  mm. Free hand resection is taken to 14 mm, a 35 template is  placed, lug holes are drilled, trial patella is placed, and it tracks normally. Osteophytes are removed off the posterior femur with the trial in place. All trials are removed and the cut bone surfaces prepared with pulsatile lavage. Cement is mixed and once ready for implantation, the size 4 tibial implant, size  4 narrow posterior stabilized femoral component, and the size 35 patella are cemented in place and the patella is held with the clamp. The trial insert is placed and the knee held in full extension. The Exparel (20 ml mixed with 60 ml saline) is injected into the extensor mechanism, posterior capsule, medial and lateral gutters and subcutaneous tissues.  All extruded cement is removed and once the cement is hard the permanent 15 mm posterior stabilized rotating platform insert is placed into the tibial tray.      The wound is copiously irrigated with saline solution and the extensor mechanism closed over a hemovac drain with #1 V-loc suture. The tourniquet is released for a total tourniquet time of 39  minutes. Flexion against gravity is 140 degrees and the patella tracks normally. Subcutaneous tissue is closed with 2.0 vicryl and subcuticular with running 4.0 Monocryl. The incision is cleaned and dried and steri-strips and a bulky sterile dressing are applied. The limb is placed into a knee immobilizer and the patient is awakened and transported to recovery in stable condition.      Please note that a surgical assistant was a medical necessity for this procedure in order to perform it in a safe and expeditious manner. Surgical assistant was necessary to retract the ligaments and vital neurovascular structures to prevent injury to them and also necessary for proper positioning of the limb to allow for anatomic placement of the prosthesis.   Dione Plover Efstathios Sawin, MD    02/01/2019, 8:19 AM

## 2019-02-02 ENCOUNTER — Encounter (HOSPITAL_COMMUNITY): Payer: Self-pay | Admitting: Orthopedic Surgery

## 2019-02-02 DIAGNOSIS — Z888 Allergy status to other drugs, medicaments and biological substances status: Secondary | ICD-10-CM | POA: Diagnosis not present

## 2019-02-02 DIAGNOSIS — Z853 Personal history of malignant neoplasm of breast: Secondary | ICD-10-CM | POA: Diagnosis not present

## 2019-02-02 DIAGNOSIS — Z885 Allergy status to narcotic agent status: Secondary | ICD-10-CM | POA: Diagnosis not present

## 2019-02-02 DIAGNOSIS — Z79899 Other long term (current) drug therapy: Secondary | ICD-10-CM | POA: Diagnosis not present

## 2019-02-02 DIAGNOSIS — M25762 Osteophyte, left knee: Secondary | ICD-10-CM | POA: Diagnosis not present

## 2019-02-02 DIAGNOSIS — Z85828 Personal history of other malignant neoplasm of skin: Secondary | ICD-10-CM | POA: Diagnosis not present

## 2019-02-02 DIAGNOSIS — M1712 Unilateral primary osteoarthritis, left knee: Secondary | ICD-10-CM | POA: Diagnosis not present

## 2019-02-02 DIAGNOSIS — E785 Hyperlipidemia, unspecified: Secondary | ICD-10-CM | POA: Diagnosis not present

## 2019-02-02 LAB — CBC
HCT: 38.1 % (ref 36.0–46.0)
Hemoglobin: 12.3 g/dL (ref 12.0–15.0)
MCH: 31.1 pg (ref 26.0–34.0)
MCHC: 32.3 g/dL (ref 30.0–36.0)
MCV: 96.2 fL (ref 80.0–100.0)
Platelets: 275 10*3/uL (ref 150–400)
RBC: 3.96 MIL/uL (ref 3.87–5.11)
RDW: 12.9 % (ref 11.5–15.5)
WBC: 12.7 10*3/uL — ABNORMAL HIGH (ref 4.0–10.5)
nRBC: 0 % (ref 0.0–0.2)

## 2019-02-02 LAB — BASIC METABOLIC PANEL
Anion gap: 7 (ref 5–15)
BUN: 11 mg/dL (ref 8–23)
CO2: 24 mmol/L (ref 22–32)
Calcium: 8 mg/dL — ABNORMAL LOW (ref 8.9–10.3)
Chloride: 109 mmol/L (ref 98–111)
Creatinine, Ser: 0.67 mg/dL (ref 0.44–1.00)
GFR calc Af Amer: >60 mL/min (ref >60)
GFR calc non Af Amer: >60 mL/min (ref >60)
Glucose, Bld: 119 mg/dL — ABNORMAL HIGH (ref 70–99)
Potassium: 4 mmol/L (ref 3.5–5.1)
Sodium: 140 mmol/L (ref 135–145)

## 2019-02-02 MED ORDER — OXYCODONE HCL 5 MG PO TABS: 5.0000 mg | ORAL_TABLET | Freq: Four times a day (QID) | ORAL | 0 refills | Status: DC | PRN

## 2019-02-02 MED ORDER — GABAPENTIN 300 MG PO CAPS: 300.0000 mg | ORAL_CAPSULE | Freq: Three times a day (TID) | ORAL | 0 refills | Status: DC

## 2019-02-02 MED ORDER — TRAMADOL HCL 50 MG PO TABS: 50.0000 mg | ORAL_TABLET | Freq: Four times a day (QID) | ORAL | 0 refills | Status: DC | PRN

## 2019-02-02 MED ORDER — RIVAROXABAN 10 MG PO TABS: 10.0000 mg | ORAL_TABLET | Freq: Every day | ORAL | 0 refills | Status: DC

## 2019-02-02 NOTE — Care Management CC44 (Signed)
Condition Code 44 Documentation Completed  Patient Details  Name: Tanya [name] MRN: [id number] Date of Birth: 03/25/1946   Condition Code 44 given:  Yes Patient signature on Condition Code 44 notice:  Yes Documentation of 2 MD's agreement:  Yes Code 44 added to claim:  Yes    Leeroy Cha, RN 02/02/2019, 10:06 AM

## 2019-02-02 NOTE — Plan of Care (Signed)
  Problem: Education: Goal: Knowledge of the prescribed therapeutic regimen will improve Outcome: Progressing   Problem: Activity: Goal: Ability to avoid complications of mobility impairment will improve Outcome: Progressing   Problem: Pain Management: Goal: Pain level will decrease with appropriate interventions Outcome: Progressing   

## 2019-02-02 NOTE — Care Management Obs Status (Signed)
[place] NOTIFICATION   Patient Details  Name: Tanya [name] MRN: [id number] Date of Birth: September 21, 1945   Medicare Observation Status Notification Given:  Yes    Leeroy Cha, RN 02/02/2019, 10:06 AM

## 2019-02-02 NOTE — Progress Notes (Signed)
   02/02/19 1400  PT Visit Information  Last PT Received On 02/02/19--ready for d/c from PT standpoint; progressing well.   Assistance Needed +1  History of Present Illness s/p L TKA; PMH: R TKA  Precautions  Precautions Knee;Fall  Precaution Comments IND SLRs  Knee Immobilizer - Left Discontinue once straight leg raise with < 10 degree lag  Restrictions  Weight Bearing Restrictions No  Other Position/Activity Restrictions WBAT  Pain Assessment  Pain Assessment 0-10  Pain Score 4  Pain Location left knee  Pain Descriptors / Indicators Discomfort  Pain Intervention(s) Limited activity within patient's tolerance;Monitored during session;Repositioned;Ice applied  Cognition  Arousal/Alertness Awake/alert  Behavior During Therapy WFL for tasks assessed/performed  Overall Cognitive Status Within Functional Limits for tasks assessed  Bed Mobility  Overal bed mobility Needs Assistance  Bed Mobility Supine to Sit;Sit to Supine  Supine to sit Supervision  Sit to supine Min guard  General bed mobility comments for safety to sit, light assist with LLE into bed  Transfers  Overall transfer level Needs assistance  Equipment used Rolling walker (2 wheeled)  Transfers Sit to/from Stand  Sit to Stand Supervision  General transfer comment cues for hand placement and LLE position  Ambulation/Gait  Ambulation/Gait assistance Min guard;Supervision  Gait Distance (Feet) 100 Feet  Assistive device Rolling walker (2 wheeled)  Gait Pattern/deviations Step-to pattern;Decreased weight shift to left  General Gait Details cues for sequence and RW position  Total Joint Exercises  Ankle Circles/Pumps AROM;Both;10 reps;Supine  Long Arc Quad AROM;Left;10 reps;Seated  PT - End of Session  Equipment Utilized During Treatment Gait belt  Activity Tolerance Patient tolerated treatment well  Patient left in chair;with call bell/phone within reach;with chair alarm set  Nurse Communication Mobility  status;Other (comment)   PT - Assessment/Plan  PT Plan Current plan remains appropriate  PT Visit Diagnosis Difficulty in walking, not elsewhere classified (R26.2)  PT Frequency (ACUTE ONLY) 7X/week  Follow Up Recommendations Follow surgeon's recommendation for DC plan and follow-up therapies  PT equipment None recommended by PT  AM-PAC PT "6 Clicks" Mobility Outcome Measure (Version 2)  Help needed turning from your back to your side while in a flat bed without using bedrails? 3  Help needed moving from lying on your back to sitting on the side of a flat bed without using bedrails? 4  Help needed moving to and from a bed to a chair (including a wheelchair)? 3  Help needed standing up from a chair using your arms (e.g., wheelchair or bedside chair)? 3  Help needed to walk in hospital room? 3  Help needed climbing 3-5 steps with a railing?  3  6 Click Score 19  Consider Recommendation of Discharge To: Home with [facility]  Acute Rehab PT Goals  PT Goal Formulation With patient  Time For Goal Achievement 02/08/19  Potential to Achieve Goals Good  PT Time Calculation  PT Start Time (ACUTE ONLY) 1342  PT Stop Time (ACUTE ONLY) 1402  PT Time Calculation (min) (ACUTE ONLY) 20 min  PT General Charges  $$ ACUTE PT VISIT 1 Visit  PT Treatments  $Gait Training 8-22 mins

## 2019-02-02 NOTE — Progress Notes (Signed)
   Subjective: 1 Day Post-Op Procedure(s) (LRB): TOTAL KNEE ARTHROPLASTY (Left) Patient reports pain as mild.   Patient seen in rounds by Dr. Wynelle Link. Patient is well, and has had no acute complaints or problems other than pain in the left knee. No issues overnight. Denies chest pain, SOB, or calf pain. Foley catheter removed this AM. We will continue therapy today.   Objective: Vital signs in last 24 hours: Temp:  [94.4 F (34.7 C)-97.5 F (36.4 C)] 96.7 F (35.9 C) (07/21 0144) Pulse Rate:  [63-82] 63 (07/21 0543) Resp:  [13-19] 18 (07/21 0543) BP: (113-147)/(69-96) 129/71 (07/21 0543) SpO2:  [94 %-100 %] 100 % (07/21 0543)  Intake/Output from previous day:  Intake/Output Summary (Last 24 hours) at 02/02/2019 0726 Last data filed at 02/02/2019 6759 Gross per 24 hour  Intake 2850.2 ml  Output 3000 ml  Net -149.8 ml    Labs: Recent Labs    02/02/19 0308  HGB 12.3   Recent Labs    02/02/19 0308  WBC 12.7*  RBC 3.96  HCT 38.1  PLT 275   Recent Labs    02/02/19 0308  NA 140  K 4.0  CL 109  CO2 24  BUN 11  CREATININE 0.67  GLUCOSE 119*  CALCIUM 8.0*   Exam: General - Patient is Alert and Oriented Extremity - Neurologically intact Neurovascular intact Sensation intact distally Dorsiflexion/Plantar flexion intact Dressing - dressing C/D/I Motor Function - intact, moving foot and toes well on exam.   Past Medical History:  Diagnosis Date  . Arthritis   . Bursitis 2014   Left; Dr Maureen Ralphs  . Cancer ([place]) 1986   breast cancer L  . Hyperlipidemia   . Lymphedema 2000   LUE  . perennial allergies   . PONV (postoperative nausea and vomiting)   . Skin cancer    basil cell    Assessment/Plan: 1 Day Post-Op Procedure(s) (LRB): TOTAL KNEE ARTHROPLASTY (Left) Active Problems:   OA (osteoarthritis) of knee  Estimated body mass index is 39.22 kg/m as calculated from the following:   Height as of this encounter: 5\' 4"  (1.626 m).   Weight as of this  encounter: 103.6 kg. Advance diet Up with therapy D/C IV fluids  Anticipated LOS equal to or greater than 2 midnights due to - Age 8 and older with one or more of the following:  - Obesity  - Expected need for hospital services (PT, OT, Nursing) required for safe  discharge  - Anticipated need for postoperative skilled nursing care or inpatient rehab  - Active co-morbidities: None OR   - Unanticipated findings during/Post Surgery: None  - Patient is a high risk of re-admission due to: None    DVT Prophylaxis - Xarelto Weight bearing as tolerated. D/C O2 and pulse ox and try on room air. Hemovac pulled without difficulty, will continue therapy today.  Plan is to go Home after hospital stay. Plan for discharge later today if progresses with therapy. Scheduled for OPPT at [facility]. Follow-up in the office in 2 weeks.   Theresa Duty, PA-C Orthopedic Surgery 02/02/2019, 7:26 AM

## 2019-02-02 NOTE — Progress Notes (Signed)
Physical Therapy Progress Note  Clinical Impression: Patient seen for LE strengthening exercises. Patient tolerated therex well with cueing and demonstration. PT provided pt with HEP handout and reviewed in full with pt. PT also provided patient education re: positioning, ice and car transfers with demonstration. Pt would continue to benefit from skilled physical therapy services at this time while admitted and after d/c to address the below listed limitations in order to improve overall safety and independence with functional mobility.  Of note, pt with blood drainage from posterior aspect of knee and onto bed linens. Pt's RN notified and presented to room to redress.  Sherie Don, [place], DPT  Acute Rehabilitation Services Pager [phone] Office [phone]    02/02/19 1200  PT Visit Information  Last PT Received On 02/02/19  Assistance Needed +1  History of Present Illness Pt is a 73 y/o female s/p L TKA on 02/01/19. PMH including but not limited to R TKA in 2013, remote breast CA and essential tremor.  Precautions  Precautions Knee;Fall  Precaution Booklet Issued Yes (comment)  Precaution Comments PT reviewed positioning of LE following knee surgery with pt   Restrictions  Weight Bearing Restrictions Yes  LLE Weight Bearing WBAT  Pain Assessment  Pain Assessment 0-10  Pain Score 2  Pain Location left knee  Pain Descriptors / Indicators Discomfort  Pain Intervention(s) Monitored during session;Repositioned  Cognition  Arousal/Alertness Awake/alert  Behavior During Therapy WFL for tasks assessed/performed  Overall Cognitive Status Within Functional Limits for tasks assessed  Exercises  Exercises Total Joint  Total Joint Exercises  Ankle Circles/Pumps AROM;Both;10 reps;Supine  Quad Sets AROM;Strengthening;Left;10 reps;Supine  Short Arc Quad AROM;Strengthening;Left;10 reps;Supine  Heel Slides AROM;Strengthening;Left;10 reps;Supine  Hip ABduction/ADduction  AROM;Strengthening;Left;10 reps;Supine  Straight Leg Raises AROM;Strengthening;Left;10 reps;Supine  PT - End of Session  Activity Tolerance Patient tolerated treatment well  Patient left in bed;with call bell/phone within reach  Nurse Communication Mobility status;Other (comment) (blood in bed and on dressing (posterior knee))  CPM Left Knee  CPM Left Knee Off   PT - Assessment/Plan  PT Plan Current plan remains appropriate  PT Visit Diagnosis Other abnormalities of gait and mobility (R26.89);Pain  Pain - Right/Left Left  Pain - part of body Knee  PT Frequency (ACUTE ONLY) 7X/week  Follow Up Recommendations Supervision - Intermittent;Outpatient PT  PT equipment None recommended by PT  AM-PAC PT "6 Clicks" Mobility Outcome Measure (Version 2)  Help needed turning from your back to your side while in a flat bed without using bedrails? 3  Help needed moving from lying on your back to sitting on the side of a flat bed without using bedrails? 4  Help needed moving to and from a bed to a chair (including a wheelchair)? 3  Help needed standing up from a chair using your arms (e.g., wheelchair or bedside chair)? 3  Help needed to walk in hospital room? 3  Help needed climbing 3-5 steps with a railing?  3  6 Click Score 19  Consider Recommendation of Discharge To: Home with [facility]  PT Goal Progression  Progress towards PT goals Progressing toward goals  Acute Rehab PT Goals  PT Goal Formulation With patient  Time For Goal Achievement 02/08/19  Potential to Achieve Goals Good  PT Time Calculation  PT Start Time (ACUTE ONLY) 0909  PT Stop Time (ACUTE ONLY) 0930  PT Time Calculation (min) (ACUTE ONLY) 21 min  PT General Charges  $$ ACUTE PT VISIT 1 Visit  PT Treatments  $Therapeutic Exercise 8-22  mins

## 2019-02-02 NOTE — Progress Notes (Signed)
Physical Therapy Treatment Patient Details Name: Tanya [name] MRN: [id number] DOB: 1945-11-11 Today's Date: 02/02/2019    History of Present Illness s/p L TKA; PMH: R TKA    PT Comments    Pt progressing well. Will likely see for a second session  As pt pick up time not until 2:30pm  Follow Up Recommendations  Follow surgeon's recommendation for DC plan and follow-up therapies     Equipment Recommendations  None recommended by PT    Recommendations for Other Services       Precautions / Restrictions Precautions Precautions: Knee;Fall Precaution Comments: IND SLRs Knee Immobilizer - Left: Discontinue once straight leg raise with < 10 degree lag Restrictions Weight Bearing Restrictions: No Other Position/Activity Restrictions: WBAT    Mobility  Bed Mobility Overal bed mobility: Needs Assistance Bed Mobility: Supine to Sit     Supine to sit: Supervision     General bed mobility comments: for safety  Transfers Overall transfer level: Needs assistance Equipment used: Rolling walker (2 wheeled) Transfers: Sit to/from Stand Sit to Stand: Min guard         General transfer comment: cues for hand placement and LLE position  Ambulation/Gait Ambulation/Gait assistance: Min guard;Supervision Gait Distance (Feet): 80 Feet Assistive device: Rolling walker (2 wheeled) Gait Pattern/deviations: Step-to pattern;Decreased weight shift to left     General Gait Details: cues for sequence and RW position   Stairs             Wheelchair Mobility    Modified Rankin (Stroke Patients Only)       Balance                                            Cognition Arousal/Alertness: Awake/alert Behavior During Therapy: WFL for tasks assessed/performed Overall Cognitive Status: Within Functional Limits for tasks assessed                                        Exercises Total Joint Exercises Long Arc Quad: AROM;Left;10  reps;Seated    General Comments        Pertinent Vitals/Pain Pain Assessment: 0-10 Pain Score: 3  Pain Location: left knee Pain Descriptors / Indicators: Discomfort Pain Intervention(s): Limited activity within patient's tolerance;Monitored during session;Repositioned;Ice applied    Home Living                      Prior Function            PT Goals (current goals can now be found in the care plan section) Acute Rehab PT Goals PT Goal Formulation: With patient Time For Goal Achievement: 02/08/19 Potential to Achieve Goals: Good Progress towards PT goals: Progressing toward goals    Frequency    7X/week      PT Plan Current plan remains appropriate    Co-evaluation              AM-PAC PT "6 Clicks" Mobility   Outcome Measure  Help needed turning from your back to your side while in a flat bed without using bedrails?: A Little Help needed moving from lying on your back to sitting on the side of a flat bed without using bedrails?: None Help needed moving to and from a bed to a chair (including a  wheelchair)?: A Little Help needed standing up from a chair using your arms (e.g., wheelchair or bedside chair)?: A Little Help needed to walk in hospital room?: A Little Help needed climbing 3-5 steps with a railing? : A Little 6 Click Score: 19    End of Session Equipment Utilized During Treatment: Gait belt Activity Tolerance: Patient tolerated treatment well Patient left: in chair;with call bell/phone within reach;with chair alarm set Nurse Communication: Mobility status;Other (comment) PT Visit Diagnosis: Difficulty in walking, not elsewhere classified (R26.2)     Time: 3254-9826 PT Time Calculation (min) (ACUTE ONLY): 20 min  Charges:  $Gait Training: 8-22 mins                     Kenyon Ana, PT  Pager: [phone] Acute Rehab Dept [facility]): [phone]   02/02/2019    [place] 02/02/2019, 10:57 AM

## 2019-02-03 ENCOUNTER — Telehealth: Payer: Self-pay | Admitting: *Deleted

## 2019-02-03 NOTE — Discharge Summary (Signed)
Physician Discharge Summary   Patient ID: Tanya [name] MRN: [id number] DOB/AGE: [birth year] 73 y.o.  Admit date: 02/01/2019 Discharge date: 02/02/2019  Primary Diagnosis: Osteoarthritis, left knee   Admission Diagnoses:  Past Medical History:  Diagnosis Date   Arthritis    Bursitis 2014   Left; Dr Maureen Ralphs   Cancer [facility]) 1986   breast cancer L   Hyperlipidemia    Lymphedema 2000   LUE   perennial allergies    PONV (postoperative nausea and vomiting)    Skin cancer    basil cell   Discharge Diagnoses:   Active Problems:   OA (osteoarthritis) of knee  Estimated body mass index is 39.22 kg/m as calculated from the following:   Height as of this encounter: 5\' 4"  (1.626 m).   Weight as of this encounter: 103.6 kg.  Procedure:  Procedure(s) (LRB): TOTAL KNEE ARTHROPLASTY (Left)   Consults: None  HPI: Tanya [name] is a 73 y.o. year old female with end stage OA of her left knee with progressively worsening pain and dysfunction. She has constant pain, with activity and at rest and significant functional deficits with difficulties even with ADLs. She has had extensive non-op management including analgesics, injections of cortisone and viscosupplements, and home exercise program, but remains in significant pain with significant dysfunction. Radiographs show bone on bone arthritis patellofemoral. She presents now for left Total Knee Arthroplasty.    Laboratory Data: Admission on 02/01/2019, Discharged on 02/02/2019  Component Date Value Ref Range Status   Glucose-Capillary 02/01/2019 94  70 - 99 mg/dL Final   WBC 02/02/2019 12.7* 4.0 - 10.5 K/uL Final   RBC 02/02/2019 3.96  3.87 - 5.11 MIL/uL Final   Hemoglobin 02/02/2019 12.3  12.0 - 15.0 g/dL Final   HCT 02/02/2019 38.1  36.0 - 46.0 % Final   MCV 02/02/2019 96.2  80.0 - 100.0 fL Final   MCH 02/02/2019 31.1  26.0 - 34.0 pg Final   MCHC 02/02/2019 32.3  30.0 - 36.0 g/dL Final   RDW 02/02/2019 12.9  11.5 - 15.5  % Final   Platelets 02/02/2019 275  150 - 400 K/uL Final   nRBC 02/02/2019 0.0  0.0 - 0.2 % Final   Performed at [facility], [place] [address], [place], [place] [address]   Sodium 02/02/2019 140  135 - 145 mmol/L Final   Potassium 02/02/2019 4.0  3.5 - 5.1 mmol/L Final   Chloride 02/02/2019 109  98 - 111 mmol/L Final   CO2 02/02/2019 24  22 - 32 mmol/L Final   Glucose, Bld 02/02/2019 119* 70 - 99 mg/dL Final   BUN 02/02/2019 11  8 - 23 mg/dL Final   Creatinine, Ser 02/02/2019 0.67  0.44 - 1.00 mg/dL Final   Calcium 02/02/2019 8.0* 8.9 - 10.3 mg/dL Final   GFR calc non Af Amer 02/02/2019 >60  >60 mL/min Final   GFR calc Af Amer 02/02/2019 >60  >60 mL/min Final   Anion gap 02/02/2019 7  5 - 15 Final   Performed at [facility], [place] [address] [place], [place] [address]  Hospital Outpatient Visit on 01/28/2019  Component Date Value Ref Range Status   SARS Coronavirus 2 01/28/2019 NEGATIVE  NEGATIVE Final   Comment: (NOTE) SARS-CoV-2 target nucleic acids are NOT DETECTED. The SARS-CoV-2 RNA is generally detectable in upper and lower respiratory specimens during the acute phase of infection. Negative results do not preclude SARS-CoV-2 infection, do not rule out co-infections with other pathogens,  and should not be used as the sole basis for treatment or other patient management decisions. Negative results must be combined with clinical observations, patient history, and epidemiological information. The expected result is Negative. Fact Sheet for Patients: SugarRoll.be Fact Sheet for Healthcare Providers: https://www.woods-mathews.com/ This test is not yet approved or cleared by the [place] FDA and  has been authorized for detection and/or diagnosis of SARS-CoV-2 by FDA under an Emergency Use Authorization (EUA). This EUA will remain  in effect (meaning this test can be used) for the  duration of the COVID-19 declaration under Section 56                          4(b)(1) of the Act, 21 U.S.C. section 360bbb-3(b)(1), unless the authorization is terminated or revoked sooner. Performed at [facility], [place] [address] [place], [place] [address]   Hospital Outpatient Visit on 01/28/2019  Component Date Value Ref Range Status   aPTT 01/28/2019 29  24 - 36 seconds Final   Performed at [facility], [place] [address], [place], [place] [address]   WBC 01/28/2019 7.6  4.0 - 10.5 K/uL Final   RBC 01/28/2019 4.75  3.87 - 5.11 MIL/uL Final   Hemoglobin 01/28/2019 14.5  12.0 - 15.0 g/dL Final   HCT 01/28/2019 44.3  36.0 - 46.0 % Final   MCV 01/28/2019 93.3  80.0 - 100.0 fL Final   MCH 01/28/2019 30.5  26.0 - 34.0 pg Final   MCHC 01/28/2019 32.7  30.0 - 36.0 g/dL Final   RDW 01/28/2019 12.6  11.5 - 15.5 % Final   Platelets 01/28/2019 330  150 - 400 K/uL Final   nRBC 01/28/2019 0.0  0.0 - 0.2 % Final   Performed at [facility], [place] [address], [place], [place] [address]   Sodium 01/28/2019 138  135 - 145 mmol/L Final   Potassium 01/28/2019 4.8  3.5 - 5.1 mmol/L Final   Chloride 01/28/2019 103  98 - 111 mmol/L Final   CO2 01/28/2019 24  22 - 32 mmol/L Final   Glucose, Bld 01/28/2019 94  70 - 99 mg/dL Final   BUN 01/28/2019 15  8 - 23 mg/dL Final   Creatinine, Ser 01/28/2019 0.78  0.44 - 1.00 mg/dL Final   Calcium 01/28/2019 9.3  8.9 - 10.3 mg/dL Final   Total Protein 01/28/2019 7.5  6.5 - 8.1 g/dL Final   Albumin 01/28/2019 4.4  3.5 - 5.0 g/dL Final   AST 01/28/2019 21  15 - 41 U/L Final   ALT 01/28/2019 20  0 - 44 U/L Final   Alkaline Phosphatase 01/28/2019 59  38 - 126 U/L Final   Total Bilirubin 01/28/2019 0.6  0.3 - 1.2 mg/dL Final   GFR calc non Af Amer 01/28/2019 >60  >60 mL/min Final   GFR calc Af Amer 01/28/2019 >60  >60 mL/min Final   Anion gap 01/28/2019 11  5 - 15 Final   Performed at [facility], [place] [address], [place], [place] [address]   Prothrombin Time 01/28/2019 13.0  11.4 - 15.2 seconds Final   INR 01/28/2019 1.0  0.8 - 1.2 Final   Comment: (NOTE) INR goal varies based on device and disease states. Performed at [facility], [place] [address], [place], [place] [address]    ABO/RH(D) 01/28/2019 O POS   Final   Antibody Screen 01/28/2019 NEG   Final  Sample Expiration 01/28/2019 02/04/2019,2359   Final   Extend sample reason 01/28/2019    Final                   Value:NO TRANSFUSIONS OR PREGNANCY IN THE PAST 3 MONTHS Performed at [place] [address] [place], [place] [address]    MRSA, PCR 01/28/2019 NEGATIVE  NEGATIVE Final   Staphylococcus aureus 01/28/2019 NEGATIVE  NEGATIVE Final   Comment: (NOTE) The Xpert SA Assay (FDA approved for NASAL specimens in patients 52 years of age and older), is one component of a comprehensive surveillance program. It is not intended to diagnose infection nor to guide or monitor treatment. Performed at [facility], [place] [address] [place], [place] [address]    Hgb A1c MFr Bld 01/28/2019 5.4  4.8 - 5.6 % Final   Comment: (NOTE) Pre diabetes:          5.7%-6.4% Diabetes:              >6.4% Glycemic control for   <7.0% adults with diabetes    Mean Plasma Glucose 01/28/2019 108.28  mg/dL Final   Performed at [facility], [place] [address] [place], [place] [address]     X-Rays:No results found.  EKG: Orders placed or performed in visit on 09/15/18   EKG 12-Lead     Hospital Course: Tanya [name] is a 73 y.o. who was admitted to [facility]. They were brought to the operating room on 02/01/2019 and underwent Procedure(s): TOTAL KNEE ARTHROPLASTY.  Patient tolerated the procedure well and was later transferred to the recovery room and then to the orthopaedic floor for postoperative care. They were given PO and IV  analgesics for pain control following their surgery. They were given 24 hours of postoperative antibiotics of  Anti-infectives (From admission, onward)   Start     Dose/Rate Route Frequency Ordered Stop   02/01/19 1400  ceFAZolin (ANCEF) IVPB 2g/100 mL premix     2 g 200 mL/hr over 30 Minutes Intravenous Every 6 hours 02/01/19 0935 02/01/19 1950   02/01/19 0600  ceFAZolin (ANCEF) IVPB 2g/100 mL premix     2 g 200 mL/hr over 30 Minutes Intravenous On call to O.R. 02/01/19 0530 02/01/19 0719     and started on DVT prophylaxis in the form of Xarelto.  PT and OT were ordered for total joint protocol. Discharge planning consulted to help with postop disposition and equipment needs.  Patient had a good night on the evening of surgery. They started to get up OOB with therapy on POD #0. Pt was seen during rounds and was ready to go home pending progress with therapy. Hemovac drain was pulled without difficulty. She worked with therapy on POD #1 and was meeting her goals. Pt was discharged to home later that day in stable condition.  Diet: Regular diet Activity: WBAT Follow-up: in 2 weeks Disposition: Home with OPPT at [facility] Discharged Condition: stable   Discharge Instructions    Call MD / Call 911   Complete by: As directed    If you experience chest pain or shortness of breath, CALL 911 and be transported to the hospital emergency room.  If you develope a fever above 101 F, pus (white drainage) or increased drainage or redness at the wound, or calf pain, call your surgeon's office.   Change dressing   Complete by: As directed    Change dressing on Wednesday, then change the dressing  daily with sterile 4 x 4 inch gauze dressing and apply TED hose.   Constipation Prevention   Complete by: As directed    Drink plenty of fluids.  Prune juice may be helpful.  You may use a stool softener, such as Colace (over the counter) 100 mg twice a day.  Use MiraLax (over the counter) for constipation as  needed.   Diet - low sodium heart healthy   Complete by: As directed    Discharge instructions   Complete by: As directed    Dr. Gaynelle Arabian Total Joint Specialist [facility] [address], [place], [place] [address] [phone]  TOTAL KNEE REPLACEMENT POSTOPERATIVE DIRECTIONS  Knee Rehabilitation, Guidelines Following Surgery  Results after knee surgery are often greatly improved when you follow the exercise, range of motion and muscle strengthening exercises prescribed by your doctor. Safety measures are also important to protect the knee from further injury. Any time any of these exercises cause you to have increased pain or swelling in your knee joint, decrease the amount until you are comfortable again and slowly increase them. If you have problems or questions, call your caregiver or physical therapist for advice.   HOME CARE INSTRUCTIONS  Remove items at home which could result in a fall. This includes throw rugs or furniture in walking pathways.  ICE to the affected knee every three hours for 30 minutes at a time and then as needed for pain and swelling.  Continue to use ice on the knee for pain and swelling from surgery. You may notice swelling that will progress down to the foot and ankle.  This is normal after surgery.  Elevate the leg when you are not up walking on it.   Continue to use the breathing machine which will help keep your temperature down.  It is common for your temperature to cycle up and down following surgery, especially at night when you are not up moving around and exerting yourself.  The breathing machine keeps your lungs expanded and your temperature down. Do not place pillow under knee, focus on keeping the knee straight while resting   DIET You may resume your previous home diet once your are discharged from the hospital.  DRESSING / WOUND CARE / SHOWERING You may change your dressing 3-5 days after surgery.  Then change the dressing every  day with sterile gauze.  Please use good hand washing techniques before changing the dressing.  Do not use any lotions or creams on the incision until instructed by your surgeon. You may start showering once you are discharged home but do not submerge the incision under water. Just pat the incision dry and apply a dry gauze dressing on daily. Change the surgical dressing daily and reapply a dry dressing each time.  ACTIVITY Walk with your walker as instructed. Use walker as long as suggested by your caregivers. Avoid periods of inactivity such as sitting longer than an hour when not asleep. This helps prevent blood clots.  You may resume a sexual relationship in one month or when given the OK by your doctor.  You may return to work once you are cleared by your doctor.  Do not drive a car for 6 weeks or until released by you surgeon.  Do not drive while taking narcotics.  WEIGHT BEARING Weight bearing as tolerated with assist device (walker, cane, etc) as directed, use it as long as suggested by your surgeon or therapist, typically at least 4-6 weeks.  POSTOPERATIVE  CONSTIPATION PROTOCOL Constipation - defined medically as fewer than three stools per week and severe constipation as less than one stool per week.  One of the most common issues patients have following surgery is constipation.  Even if you have a regular bowel pattern at home, your normal regimen is likely to be disrupted due to multiple reasons following surgery.  Combination of anesthesia, postoperative narcotics, change in appetite and fluid intake all can affect your bowels.  In order to avoid complications following surgery, here are some recommendations in order to help you during your recovery period.  Colace (docusate) - Pick up an over-the-counter form of Colace or another stool softener and take twice a day as long as you are requiring postoperative pain medications.  Take with a full glass of water daily.  If you  experience loose stools or diarrhea, hold the colace until you stool forms back up.  If your symptoms do not get better within 1 week or if they get worse, check with your doctor.  Dulcolax (bisacodyl) - Pick up over-the-counter and take as directed by the product packaging as needed to assist with the movement of your bowels.  Take with a full glass of water.  Use this product as needed if not relieved by Colace only.   MiraLax (polyethylene glycol) - Pick up over-the-counter to have on hand.  MiraLax is a solution that will increase the amount of water in your bowels to assist with bowel movements.  Take as directed and can mix with a glass of water, juice, soda, coffee, or tea.  Take if you go more than two days without a movement. Do not use MiraLax more than once per day. Call your doctor if you are still constipated or irregular after using this medication for 7 days in a row.  If you continue to have problems with postoperative constipation, please contact the office for further assistance and recommendations.  If you experience "the worst abdominal pain ever" or develop nausea or vomiting, please contact the office immediatly for further recommendations for treatment.  ITCHING  If you experience itching with your medications, try taking only a single pain pill, or even half a pain pill at a time.  You can also use Benadryl over the counter for itching or also to help with sleep.   TED HOSE STOCKINGS Wear the elastic stockings on both legs for three weeks following surgery during the day but you may remove then at night for sleeping.  MEDICATIONS See your medication summary on the "After Visit Summary" that the nursing staff will review with you prior to discharge.  You may have some home medications which will be placed on hold until you complete the course of blood thinner medication.  It is important for you to complete the blood thinner medication as prescribed by your surgeon.  Continue  your approved medications as instructed at time of discharge.  PRECAUTIONS If you experience chest pain or shortness of breath - call 911 immediately for transfer to the hospital emergency department.  If you develop a fever greater that 101 F, purulent drainage from wound, increased redness or drainage from wound, foul odor from the wound/dressing, or calf pain - CONTACT YOUR SURGEON.  FOLLOW-UP APPOINTMENTS Make sure you keep all of your appointments after your operation with your surgeon and caregivers. You should call the office at the above phone number and make an appointment for approximately two weeks after the date of your surgery or on the date instructed by your surgeon outlined in the "After Visit Summary".   RANGE OF MOTION AND STRENGTHENING EXERCISES  Rehabilitation of the knee is important following a knee injury or an operation. After just a few days of immobilization, the muscles of the thigh which control the knee become weakened and shrink (atrophy). Knee exercises are designed to build up the tone and strength of the thigh muscles and to improve knee motion. Often times heat used for twenty to thirty minutes before working out will loosen up your tissues and help with improving the range of motion but do not use heat for the first two weeks following surgery. These exercises can be done on a training (exercise) mat, on the floor, on a table or on a bed. Use what ever works the best and is most comfortable for you Knee exercises include:  Leg Lifts - While your knee is still immobilized in a splint or cast, you can do straight leg raises. Lift the leg to 60 degrees, hold for 3 sec, and slowly lower the leg. Repeat 10-20 times 2-3 times daily. Perform this exercise against resistance later as your knee gets better.  Quad and Hamstring Sets - Tighten up the muscle on the front of the thigh (Quad) and hold for 5-10 sec. Repeat this 10-20  times hourly. Hamstring sets are done by pushing the foot backward against an object and holding for 5-10 sec. Repeat as with quad sets.  Leg Slides: Lying on your back, slowly slide your foot toward your buttocks, bending your knee up off the floor (only go as far as is comfortable). Then slowly slide your foot back down until your leg is flat on the floor again. Angel Wings: Lying on your back spread your legs to the side as far apart as you can without causing discomfort.  A rehabilitation program following serious knee injuries can speed recovery and prevent re-injury in the future due to weakened muscles. Contact your doctor or a physical therapist for more information on knee rehabilitation.   IF YOU ARE TRANSFERRED TO A SKILLED REHAB FACILITY If the patient is transferred to a skilled rehab facility following release from the hospital, a list of the current medications will be sent to the facility for the patient to continue.  When discharged from the skilled rehab facility, please have the facility set up the patient's [place] prior to being released. Also, the skilled facility will be responsible for providing the patient with their medications at time of release from the facility to include their pain medication, the muscle relaxants, and their blood thinner medication. If the patient is still at the rehab facility at time of the two week follow up appointment, the skilled rehab facility will also need to assist the patient in arranging follow up appointment in our office and any transportation needs.  MAKE SURE YOU:  Understand these instructions.  Get help right away if you are not doing well or get worse.    Pick up stool softner and laxative for home use following surgery while on pain medications. Do not submerge incision under water. Please use good hand washing techniques while changing dressing each day. May shower starting three days after surgery. Please  use  a clean towel to pat the incision dry following showers. Continue to use ice for pain and swelling after surgery. Do not use any lotions or creams on the incision until instructed by your surgeon.   Do not put a pillow under the knee. Place it under the heel.   Complete by: As directed    Driving restrictions   Complete by: As directed    No driving for two weeks   TED hose   Complete by: As directed    Use stockings (TED hose) for three weeks on both leg(s).  You may remove them at night for sleeping.   Weight bearing as tolerated   Complete by: As directed      Allergies as of 02/02/2019      Reactions   Hydromorphone    whelps on skin    Methocarbamol    Whelps on skin    Prednisone    Reaction=wired per patient      Medication List    STOP taking these medications   CALCIUM 600 + D PO   cholecalciferol 1000 units tablet Commonly known as: VITAMIN D   multivitamin tablet     TAKE these medications   azelastine 0.1 % nasal spray Commonly known as: ASTELIN Place 1 spray into both nostrils 2 (two) times daily. Use in each nostril as directed   fluticasone 50 MCG/ACT nasal spray Commonly known as: FLONASE Place 1 spray into the nose daily.   gabapentin 300 MG capsule Commonly known as: NEURONTIN Take 1 capsule (300 mg total) by mouth 3 (three) times daily. Take a 300 mg capsule three times a day for two weeks following surgery.Then take a 300 mg capsule two times a day for two weeks. Then take a 300 mg capsule once a day for two weeks. Then discontinue.   ipratropium 0.06 % nasal spray Commonly known as: ATROVENT Place 1 spray into both nostrils 2 (two) times a day.   levocetirizine 5 MG tablet Commonly known as: XYZAL Take 5 mg by mouth every evening.   oxyCODONE 5 MG immediate release tablet Commonly known as: Oxy IR/ROXICODONE Take 1-2 tablets (5-10 mg total) by mouth every 6 (six) hours as needed for severe pain.   rivaroxaban 10 MG Tabs tablet Commonly  known as: XARELTO Take 1 tablet (10 mg total) by mouth daily with breakfast for 20 days. Then take one 81 mg aspirin once a day for three weeks. Then discontinue aspirin.   simvastatin 20 MG tablet Commonly known as: ZOCOR Take 1 tablet (20 mg total) by mouth every evening.   traMADol 50 MG tablet Commonly known as: ULTRAM Take 1-2 tablets (50-100 mg total) by mouth every 6 (six) hours as needed for moderate pain.            Discharge Care Instructions  (From admission, onward)         Start     Ordered   02/02/19 0000  Weight bearing as tolerated     02/02/19 0729   02/02/19 0000  Change dressing    Comments: Change dressing on Wednesday, then change the dressing daily with sterile 4 x 4 inch gauze dressing and apply TED hose.   02/02/19 0729         Follow-up Information    Gaynelle Arabian, MD. Schedule an appointment as soon as possible for a visit on 02/16/2019.   Specialty: Orthopedic Surgery Contact information: [address] [place] [address] [phone]  Signed: Theresa Duty, PA-C Orthopedic Surgery 02/03/2019, 7:55 AM

## 2019-02-03 NOTE — Telephone Encounter (Signed)
Pt was on TCM report admitted 02/02/19 underwent a TOTAL KNEE ARTHROPLASTY.  Patient tolerated the procedure well and was later transferred to the recovery room and then to the orthopaedic floor for postoperative care. Pt D/C 02/03/19, and will follow-up w/orthopedic surgeon in 2-3 weeks.Marland KitchenJohny Chess

## 2019-02-05 DIAGNOSIS — M25562 Pain in left knee: Secondary | ICD-10-CM | POA: Diagnosis not present

## 2019-02-11 DIAGNOSIS — M25562 Pain in left knee: Secondary | ICD-10-CM | POA: Diagnosis not present

## 2019-02-12 ENCOUNTER — Emergency Department (HOSPITAL_COMMUNITY): Payer: Medicare HMO

## 2019-02-12 ENCOUNTER — Other Ambulatory Visit: Payer: Self-pay

## 2019-02-12 ENCOUNTER — Emergency Department (HOSPITAL_COMMUNITY)
Admission: EM | Admit: 2019-02-12 | Discharge: 2019-02-13 | Disposition: A | Discharge status: Home / Self Care | Payer: Medicare HMO | Attending: Emergency Medicine | Admitting: Emergency Medicine

## 2019-02-12 ENCOUNTER — Encounter (HOSPITAL_COMMUNITY): Payer: Self-pay | Admitting: Emergency Medicine

## 2019-02-12 DIAGNOSIS — Z853 Personal history of malignant neoplasm of breast: Secondary | ICD-10-CM | POA: Insufficient documentation

## 2019-02-12 DIAGNOSIS — Z7901 Long term (current) use of anticoagulants: Secondary | ICD-10-CM | POA: Diagnosis not present

## 2019-02-12 DIAGNOSIS — Z79899 Other long term (current) drug therapy: Secondary | ICD-10-CM | POA: Diagnosis not present

## 2019-02-12 DIAGNOSIS — Z85828 Personal history of other malignant neoplasm of skin: Secondary | ICD-10-CM | POA: Diagnosis not present

## 2019-02-12 DIAGNOSIS — R42 Dizziness and giddiness: Secondary | ICD-10-CM | POA: Diagnosis not present

## 2019-02-12 DIAGNOSIS — I10 Essential (primary) hypertension: Secondary | ICD-10-CM | POA: Diagnosis not present

## 2019-02-12 DIAGNOSIS — R4182 Altered mental status, unspecified: Secondary | ICD-10-CM | POA: Diagnosis not present

## 2019-02-12 DIAGNOSIS — R110 Nausea: Secondary | ICD-10-CM | POA: Diagnosis not present

## 2019-02-12 DIAGNOSIS — R112 Nausea with vomiting, unspecified: Secondary | ICD-10-CM | POA: Diagnosis not present

## 2019-02-12 DIAGNOSIS — R1111 Vomiting without nausea: Secondary | ICD-10-CM | POA: Diagnosis not present

## 2019-02-12 DIAGNOSIS — Z96653 Presence of artificial knee joint, bilateral: Secondary | ICD-10-CM | POA: Insufficient documentation

## 2019-02-12 LAB — URINALYSIS, ROUTINE W REFLEX MICROSCOPIC
Bilirubin Urine: NEGATIVE
Glucose, UA: NEGATIVE mg/dL
Hgb urine dipstick: NEGATIVE
Ketones, ur: 20 mg/dL — AB
Leukocytes,Ua: NEGATIVE
Nitrite: NEGATIVE
Protein, ur: NEGATIVE mg/dL
Specific Gravity, Urine: 1.016 (ref 1.005–1.030)
pH: 6 (ref 5.0–8.0)

## 2019-02-12 LAB — COMPREHENSIVE METABOLIC PANEL
ALT: 18 U/L (ref 0–44)
AST: 22 U/L (ref 15–41)
Albumin: 3.2 g/dL — ABNORMAL LOW (ref 3.5–5.0)
Alkaline Phosphatase: 73 U/L (ref 38–126)
Anion gap: 9 (ref 5–15)
BUN: 13 mg/dL (ref 8–23)
CO2: 24 mmol/L (ref 22–32)
Calcium: 8.9 mg/dL (ref 8.9–10.3)
Chloride: 108 mmol/L (ref 98–111)
Creatinine, Ser: 0.79 mg/dL (ref 0.44–1.00)
GFR calc Af Amer: >60 mL/min (ref >60)
GFR calc non Af Amer: >60 mL/min (ref >60)
Glucose, Bld: 117 mg/dL — ABNORMAL HIGH (ref 70–99)
Potassium: 5.1 mmol/L (ref 3.5–5.1)
Sodium: 141 mmol/L (ref 135–145)
Total Bilirubin: 0.6 mg/dL (ref 0.3–1.2)
Total Protein: 6.1 g/dL — ABNORMAL LOW (ref 6.5–8.1)

## 2019-02-12 LAB — CBC
HCT: 37.6 % (ref 36.0–46.0)
Hemoglobin: 12 g/dL (ref 12.0–15.0)
MCH: 30.5 pg (ref 26.0–34.0)
MCHC: 31.9 g/dL (ref 30.0–36.0)
MCV: 95.4 fL (ref 80.0–100.0)
Platelets: 535 10*3/uL — ABNORMAL HIGH (ref 150–400)
RBC: 3.94 MIL/uL (ref 3.87–5.11)
RDW: 12.9 % (ref 11.5–15.5)
WBC: 8.9 10*3/uL (ref 4.0–10.5)
nRBC: 0 % (ref 0.0–0.2)

## 2019-02-12 MED ORDER — MECLIZINE HCL 12.5 MG PO TABS: 12.5000 mg | ORAL_TABLET | Freq: Three times a day (TID) | ORAL | 0 refills | Status: DC | PRN

## 2019-02-12 MED ORDER — ONDANSETRON HCL 4 MG/2ML IJ SOLN
4.0000 mg | Freq: Once | INTRAMUSCULAR | Status: AC
  Administered 2019-02-12: 4 mg via INTRAVENOUS
  Filled 2019-02-12: qty 2

## 2019-02-12 MED ORDER — MECLIZINE HCL 25 MG PO TABS
12.5000 mg | ORAL_TABLET | Freq: Once | ORAL | Status: AC
  Administered 2019-02-12: 12.5 mg via ORAL
  Filled 2019-02-12: qty 1

## 2019-02-12 NOTE — ED Notes (Signed)
Ambulated pt x2 assist, pt unsteady on feet. EDP made aware.

## 2019-02-12 NOTE — ED Triage Notes (Signed)
Pt brought in by [facility] from home for sudden onset pf dizziness while attempting to do her exercises for her knee. Per EMS pt had recent knee replacement.

## 2019-02-12 NOTE — Discharge Instructions (Addendum)
It was our pleasure to provide your ER care today - we hope that you feel better.  Rest. Drink plenty of fluids. Try antivert as need for dizziness.  Do not drive or operate machinery while taking the sedating medications.  Follow up with primary care doctor in the coming week if symptoms fail to improve/resolve.  Return to ER if worse, new symptoms, severe dizziness, persistent vomiting, weak/fainting, chest pain, trouble breathing, fevers, other concern.

## 2019-02-12 NOTE — ED Provider Notes (Addendum)
[name] EMERGENCY DEPARTMENT Provider Note   CSN: [id number] Arrival date & time: 02/12/19  1931    History   Chief Complaint Chief Complaint  Patient presents with  . Dizziness  . Emesis    HPI Tanya [name] is a 73 y.o. female.     Patient pod #1 s/p left TKA, c/o acute onset dizziness this evening. Pt had stood up to move to a new room, when had acute onset room spinning sensation, felt off balance, room spinning, felt lightheaded - was able to grab onto something to avoid falling. +associated nausea. Symptoms acute onset, moderate, persistent, worse w movement/change in position. Denies hx vertigo. No recent nasal/sinus congestion. No ear pain, hearing loss or tinnitus. No severe headaches. Denies change in speech or vision. No numbness or weakness.  Denies chest pain or discomfort. No sob. No abd pain or vomiting. No diarrhea, having normal bms. Did eat/drink relatively little today. No gu c/o. No new med use today, no pain meds today. No fever or chills.   The history is provided by the patient.    Past Medical History:  Diagnosis Date  . Arthritis   . Bursitis 2014   Left; Dr Maureen Ralphs  . Cancer ([place]) 1986   breast cancer L  . Hyperlipidemia   . Lymphedema 2000   LUE  . perennial allergies   . PONV (postoperative nausea and vomiting)   . Skin cancer    basil cell    Patient Active Problem List   Diagnosis Date Noted  . Pre-op examination 09/15/2018  . Dysphagia 09/15/2018  . Herpes zoster without complication 36/62/9476  . 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/29/2015  . Essential tremor 12/14/2013  . Osteopenia 06/11/2013  . OA (osteoarthritis) of knee 02/24/2012  . Prediabetes 04/06/2009  . NONTOXIC MULTINODULAR GOITER 03/03/2008  . Hyperlipidemia 12/02/2007  . ALLERGIC RHINITIS 12/02/2007  . Personal history of malignant neoplasm of breast 12/02/2007  . History of skin cancer 12/02/2007  . Vitamin D deficiency 12/02/2007    Past Surgical  History:  Procedure Laterality Date  . ABDOMINAL HYSTERECTOMY  1990   & BSO for fibroids  . ANKLE FRACTURE SURGERY  2006  . BREAST RECONSTRUCTION  1986  . BREAST SURGERY  1986   double mastectonmy fror cancer on L  . COLONOSCOPY  04/14/2006   Tics ; Dr Earlean Shawl  . KNEE ARTHROSCOPY  2001 & 2011   L & R  . TOTAL KNEE ARTHROPLASTY  02/24/2012   Procedure: TOTAL KNEE ARTHROPLASTY;  Surgeon: Gearlean Alf, MD;  Location: [facility];  Service: Orthopedics;  Laterality: Right;  . TOTAL KNEE ARTHROPLASTY Left 02/01/2019   Procedure: TOTAL KNEE ARTHROPLASTY;  Surgeon: Gaynelle Arabian, MD;  Location: [facility];  Service: Orthopedics;  Laterality: Left;  30min  . WISDOM TOOTH EXTRACTION       OB History   No obstetric history on file.      Home Medications    Prior to Admission medications   Medication Sig Start Date End Date Taking? Authorizing Provider  azelastine (ASTELIN) 0.1 % nasal spray Place 1 spray into both nostrils 2 (two) times daily. Use in each nostril as directed     [provider]  fluticasone (FLONASE) 50 MCG/ACT nasal spray Place 1 spray into the nose daily.     [provider]  gabapentin (NEURONTIN) 300 MG capsule Take 1 capsule (300 mg total) by mouth 3 (three) times daily. Take a 300 mg capsule three times a day  for two weeks following surgery.Then take a 300 mg capsule two times a day for two weeks. Then take a 300 mg capsule once a day for two weeks. Then discontinue. 02/02/19   Edmisten, Kristie L, PA  ipratropium (ATROVENT) 0.06 % nasal spray Place 1 spray into both nostrils 2 (two) times a day.     [provider]  levocetirizine (XYZAL) 5 MG tablet Take 5 mg by mouth every evening.    [provider]  oxyCODONE (OXY IR/ROXICODONE) 5 MG immediate release tablet Take 1-2 tablets (5-10 mg total) by mouth every 6 (six) hours as needed for severe pain. 02/02/19   Edmisten, Ok Anis, PA  rivaroxaban (XARELTO) 10 MG TABS tablet Take 1 tablet (10  mg total) by mouth daily with breakfast for 20 days. Then take one 81 mg aspirin once a day for three weeks. Then discontinue aspirin. 02/02/19 02/22/19  Edmisten, Ok Anis, PA  simvastatin (ZOCOR) 20 MG tablet Take 1 tablet (20 mg total) by mouth every evening. 09/15/18   Binnie Rail, MD  traMADol (ULTRAM) 50 MG tablet Take 1-2 tablets (50-100 mg total) by mouth every 6 (six) hours as needed for moderate pain. 02/02/19   Edmisten, Ok Anis, PA    Family History Family History  Problem Relation Age of Onset  . Hypertension Mother   . Heart disease Mother        pacer  . Colon polyps Mother        pre cancerous  . Glaucoma Mother   . Dementia Mother   . Heart attack Maternal Grandmother 94  . Hypertension Maternal Grandmother   . Stroke Maternal Grandfather 43  . Hypertension Maternal Grandfather   . Heart attack Maternal Uncle 65  . Cancer Neg Hx   . Colon cancer Neg Hx   . Esophageal cancer Neg Hx   . Rectal cancer Neg Hx   . Stomach cancer Neg Hx   . Pancreatic cancer Neg Hx     Social History Social History   Tobacco Use  . Smoking status: Never Smoker  . Smokeless tobacco: Never Used  Substance Use Topics  . Alcohol use: Yes    Comment:  very rarely, 1-2 per month  . Drug use: No     Allergies   Hydromorphone, Methocarbamol, and Prednisone   Review of Systems Review of Systems  Constitutional: Negative for chills and fever.  HENT: Negative for ear pain, hearing loss and tinnitus.   Eyes: Negative for pain and visual disturbance.  Respiratory: Negative for cough and shortness of breath.   Cardiovascular: Negative for chest pain and palpitations.  Gastrointestinal: Positive for nausea. Negative for abdominal pain and vomiting.  Genitourinary: Negative for dysuria and flank pain.  Musculoskeletal: Negative for back pain and neck pain.  Skin:       Left knee incision healing without sign of infection.   Neurological: Negative for speech difficulty, weakness,  numbness and headaches.  Hematological: Does not bruise/bleed easily.  Psychiatric/Behavioral: Negative for confusion.     Physical Exam Updated Vital Signs BP (!) 148/83 (BP Location: Left Arm)   Pulse 81   Temp 97.6 F (36.4 C) (Oral)   Resp (!) 21   SpO2 100%   Physical Exam Vitals signs and nursing note reviewed.  Constitutional:      Appearance: Normal appearance. She is well-developed.  HENT:     Head: Atraumatic.     Right Ear: Tympanic membrane normal.     Left Ear:  Tympanic membrane normal.     Nose: Nose normal.     Mouth/Throat:     Mouth: Mucous membranes are moist.  Eyes:     General: No scleral icterus.    Extraocular Movements: Extraocular movements intact.     Conjunctiva/sclera: Conjunctivae normal.     Pupils: Pupils are equal, round, and reactive to light.  Neck:     Musculoskeletal: Normal range of motion and neck supple. No neck rigidity or muscular tenderness.     Vascular: No carotid bruit.     Trachea: No tracheal deviation.  Cardiovascular:     Rate and Rhythm: Normal rate and regular rhythm.     Pulses: Normal pulses.     Heart sounds: Normal heart sounds. No murmur. No friction rub. No gallop.   Pulmonary:     Effort: Pulmonary effort is normal. No respiratory distress.     Breath sounds: Normal breath sounds.  Abdominal:     General: Bowel sounds are normal. There is no distension.     Palpations: Abdomen is soft. There is no mass.     Tenderness: There is no abdominal tenderness. There is no guarding.  Genitourinary:    Comments: No cva tenderness.  Musculoskeletal:     Comments: Swelling/bruising about left knee consistent w recent surgery, incision healing without sign of infection to area. Distal pulses palp bil.   Skin:    General: Skin is warm and dry.     Findings: No rash.  Neurological:     Mental Status: She is alert.     Comments: Alert, speech normal//fluent. No facial weakness. No pronator drift. Motor intact bil, stre  5/5. sens grossly intact bil.   Psychiatric:        Mood and Affect: Mood normal.      ED Treatments / Results  Labs (all labs ordered are listed, but only abnormal results are displayed) Results for orders placed or performed during the hospital encounter of 02/12/19  CBC  Result Value Ref Range   WBC 8.9 4.0 - 10.5 K/uL   RBC 3.94 3.87 - 5.11 MIL/uL   Hemoglobin 12.0 12.0 - 15.0 g/dL   HCT 37.6 36.0 - 46.0 %   MCV 95.4 80.0 - 100.0 fL   MCH 30.5 26.0 - 34.0 pg   MCHC 31.9 30.0 - 36.0 g/dL   RDW 12.9 11.5 - 15.5 %   Platelets 535 (H) 150 - 400 K/uL   nRBC 0.0 0.0 - 0.2 %  Comprehensive metabolic panel  Result Value Ref Range   Sodium 141 135 - 145 mmol/L   Potassium 5.1 3.5 - 5.1 mmol/L   Chloride 108 98 - 111 mmol/L   CO2 24 22 - 32 mmol/L   Glucose, Bld 117 (H) 70 - 99 mg/dL   BUN 13 8 - 23 mg/dL   Creatinine, Ser 0.79 0.44 - 1.00 mg/dL   Calcium 8.9 8.9 - 10.3 mg/dL   Total Protein 6.1 (L) 6.5 - 8.1 g/dL   Albumin 3.2 (L) 3.5 - 5.0 g/dL   AST 22 15 - 41 U/L   ALT 18 0 - 44 U/L   Alkaline Phosphatase 73 38 - 126 U/L   Total Bilirubin 0.6 0.3 - 1.2 mg/dL   GFR calc non Af Amer >60 >60 mL/min   GFR calc Af Amer >60 >60 mL/min   Anion gap 9 5 - 15   Ct Head Wo Contrast  Result Date: 02/12/2019 CLINICAL DATA:  Altered level of consciousness.  Dizziness. EXAM: CT HEAD WITHOUT CONTRAST TECHNIQUE: Contiguous axial images were obtained from the base of the skull through the vertex without intravenous contrast. COMPARISON:  None. FINDINGS: Brain: No acute intracranial abnormality. Specifically, no hemorrhage, hydrocephalus, mass lesion, acute infarction, or significant intracranial injury. Vascular: No hyperdense vessel or unexpected calcification. Skull: No acute calvarial abnormality. Sinuses/Orbits: Visualized paranasal sinuses and mastoids clear. Orbital soft tissues unremarkable. Other: None IMPRESSION: No acute intracranial abnormality. Electronically Signed   By: Rolm Baptise M.D.   On: 02/12/2019 20:56    EKG None  Radiology Ct Head Wo Contrast  Result Date: 02/12/2019 CLINICAL DATA:  Altered level of consciousness.  Dizziness. EXAM: CT HEAD WITHOUT CONTRAST TECHNIQUE: Contiguous axial images were obtained from the base of the skull through the vertex without intravenous contrast. COMPARISON:  None. FINDINGS: Brain: No acute intracranial abnormality. Specifically, no hemorrhage, hydrocephalus, mass lesion, acute infarction, or significant intracranial injury. Vascular: No hyperdense vessel or unexpected calcification. Skull: No acute calvarial abnormality. Sinuses/Orbits: Visualized paranasal sinuses and mastoids clear. Orbital soft tissues unremarkable. Other: None IMPRESSION: No acute intracranial abnormality. Electronically Signed   By: Rolm Baptise M.D.   On: 02/12/2019 20:56    Procedures Procedures (including critical care time)  Medications Ordered in ED Medications  ondansetron [facility]) injection 4 mg (4 mg Intravenous Given 02/12/19 2111)  meclizine (ANTIVERT) tablet 12.5 mg (12.5 mg Oral Given 02/12/19 2115)     Initial Impression / Assessment and Plan / ED Course  I have reviewed the triage vital signs and the nursing notes.  Pertinent labs & imaging results that were available during my care of the patient were reviewed by me and considered in my medical decision making (see chart for details).  Iv ns bolus. zofran iv. antivert po. Labs and imaging ordered.   Reviewed nursing notes and prior charts for additional history.   Labs reviewed by me - chem normal, hgb normal.  CT reviewed by me - no acute hem, no acute process.  Recheck pt without faintness or dizziness. No room spinning.   Orthostatic vitals.  On reassessment, dizziness and nausea improved.   On attempted ambulation, pt is quite unsteady, leans back and forth - pt assisted back to bed. Will get MRI r/o CVA.   Signed out to Dr Wyvonnia Dusky to check mri, recheck pt, and  dispo appropriately.   Final Clinical Impressions(s) / ED Diagnoses   Final diagnoses:  None    ED Discharge Orders    None         Lajean Saver, MD 02/12/19 2320

## 2019-02-13 ENCOUNTER — Emergency Department (HOSPITAL_COMMUNITY): Payer: Medicare HMO

## 2019-02-13 DIAGNOSIS — R42 Dizziness and giddiness: Secondary | ICD-10-CM | POA: Diagnosis not present

## 2019-02-13 MED ORDER — DIAZEPAM 2 MG PO TABS
2.0000 mg | ORAL_TABLET | Freq: Once | ORAL | Status: AC
  Administered 2019-02-13: 2 mg via ORAL
  Filled 2019-02-13: qty 1

## 2019-02-13 MED ORDER — ONDANSETRON HCL 4 MG/2ML IJ SOLN
4.0000 mg | Freq: Once | INTRAMUSCULAR | Status: AC
  Administered 2019-02-13: 4 mg via INTRAVENOUS
  Filled 2019-02-13: qty 2

## 2019-02-13 MED ORDER — SODIUM CHLORIDE 0.9 % IV BOLUS
1000.0000 mL | Freq: Once | INTRAVENOUS | Status: AC
  Administered 2019-02-13: 1000 mL via INTRAVENOUS

## 2019-02-13 MED ORDER — DIAZEPAM 2 MG PO TABS: 2.0000 mg | ORAL_TABLET | Freq: Three times a day (TID) | ORAL | 0 refills | Status: DC | PRN

## 2019-02-13 NOTE — ED Notes (Signed)
Pt ambulated around room x2 assist, continues to be unsteady on feet.

## 2019-02-13 NOTE — ED Notes (Signed)
Patient transported to MRI 

## 2019-02-13 NOTE — ED Notes (Signed)
Patient was able to stand unassisted with walker. Ambulated with walker with no problem and pt did not complain of dizziness.

## 2019-02-13 NOTE — ED Notes (Signed)
Pt states that she is ready to go home.

## 2019-02-13 NOTE — ED Provider Notes (Signed)
Care assumed from Dr. Ashok Cordia.  Patient with new onset vertigo, feeling off balance, room spinning and lightheadedness with nausea.  Worse with position change and movement.  No history of vertigo.  Awaiting MRI.  MRI is negative for acute infarct  Patient still with unsteadiness on her feet and not able to walk without assistance.  States it is due to dizziness and not her recent knee surgery.  She has no ataxia on finger to nose but does have some right-sided horizontal nystagmus.  She will be given IV fluid and p.o. Valium..  Patient much improved after fluids and Valium.  She is able to ambulate with minimal assistance.  States she still has her walker at home from her knee surgery.  Advised to use this as needed.  She will be given referrals to ENT for her vertigo.  Discussed caution with using the dizziness medication including meclizine and Valium as it can make her sleepy.  Instructed not to work or drive while using these medications. Follow up with PCP and ENT.  Return precautions discussed.   BP (!) 149/94 (BP Location: Right Arm)   Pulse 85   Temp 97.6 F (36.4 C) (Oral)   Resp 18   SpO2 95%     Ezequiel Essex, MD 02/13/19 [phone]

## 2019-02-18 DIAGNOSIS — M25562 Pain in left knee: Secondary | ICD-10-CM | POA: Diagnosis not present

## 2019-02-22 DIAGNOSIS — R42 Dizziness and giddiness: Secondary | ICD-10-CM | POA: Insufficient documentation

## 2019-02-22 NOTE — Patient Instructions (Addendum)
  Medications reviewed and updated.  Changes include :   none  .  A referral was ordered for GI   Please call if there is no improvement in your symptoms.      Vertigo Vertigo is the feeling that you or your surroundings are moving when they are not. This feeling can come and go at any time. Vertigo often goes away on its own. Vertigo can be dangerous if it occurs while you are doing something that could endanger you or others, such as driving or operating machinery. Your health care provider will do tests to determine the cause of your vertigo. Tests will also help your health care provider decide how best to treat your condition. Follow these instructions at home: Eating and drinking      Drink enough fluid to keep your urine pale yellow.  Do not drink alcohol. Activity  Return to your normal activities as told by your health care provider. Ask your health care provider what activities are safe for you.  In the morning, first sit up on the side of the bed. When you feel okay, stand slowly while you hold onto something until you know that your balance is fine.  Move slowly. Avoid sudden body or head movements or certain positions, as told by your health care provider.  If you have trouble walking or keeping your balance, try using a cane for stability. If you feel dizzy or unstable, sit down right away.  Avoid doing any tasks that would cause danger to you or others if vertigo occurs.  Avoid bending down if you feel dizzy. Place items in your home so that they are easy for you to reach without leaning over.  Do not drive or use heavy machinery if you feel dizzy. General instructions  Take over-the-counter and prescription medicines only as told by your health care provider.  Keep all follow-up visits as told by your health care provider. This is important. Contact a health care provider if:  Your medicines do not relieve your vertigo or they make it worse.  You have a  fever.  Your condition gets worse or you develop new symptoms.  Your family or friends notice any behavioral changes.  Your nausea or vomiting gets worse.  You have numbness or a prickling and tingling sensation in part of your body. Get help right away if you:  Have difficulty moving or speaking.  Are always dizzy.  Faint.  Develop severe headaches.  Have weakness in your hands, arms, or legs.  Have changes in your hearing or vision.  Develop a stiff neck.  Develop sensitivity to light. Summary  Vertigo is the feeling that you or your surroundings are moving when they are not.  Your health care provider will do tests to determine the cause of your vertigo.  Follow instructions for home care. You may be told to avoid certain tasks, positions, or movements.  Contact a health care provider if your medicines do not relieve your symptoms, or if you have a fever, nausea, vomiting, or changes in behavior.  Get help right away if you have severe headaches or difficulty speaking, or you develop hearing or vision problems. This information is not intended to replace advice given to you by your health care provider. Make sure you discuss any questions you have with your health care provider. Document Released: 04/10/2005 Document Revised: 05/25/2018 Document Reviewed: 05/25/2018 Elsevier Patient Education  2020 [employer].

## 2019-02-22 NOTE — Progress Notes (Signed)
Subjective:    Patient ID: Tanya [name], female    DOB: October 12, 1945, 73 y.o.   MRN: [id number]  HPI The patient is here for follow up.  ED 7/31 for new onset vertigo.   She went ot the ED complaining of vertigo, feeling off balanced, room spinning and lightheadedness with nausea.  Her symptoms are worse with position changes and movement.  Neuro exam non-focal.  Blood work unremarkable. MRI in ED was negative.  She received IVF, zofran, meclizine and po valium.  She felt much better after treatment.  She was able to ambulate with minimal assistance.  She was discharged home on valium and meclizine.  She was advised to see ENT.    Since the ED she has occasionally funny feeling in her head.  She usually takes her cane.  She feels it is slowly improving, but she is not back to normal.  She has not taken the meclizine or valium.     She does not think the vertigo is related to head movements.  She is taking benadryl for her allergic reaction to oxycodone - has blisters, hives on back.    Dysphagia has gotten worse.  She thinks she needs to do something about that and would like to be referred.  She does keep hydrated.    Medications and allergies reviewed with patient and updated if appropriate.  Patient Active Problem List   Diagnosis Date Noted  . Vertigo 02/22/2019  . Pre-op examination 09/15/2018  . Dysphagia 09/15/2018  . Herpes zoster without complication 57/26/2035  . 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/29/2015  . Essential tremor 12/14/2013  . Osteopenia 06/11/2013  . OA (osteoarthritis) of knee 02/24/2012  . Prediabetes 04/06/2009  . NONTOXIC MULTINODULAR GOITER 03/03/2008  . Hyperlipidemia 12/02/2007  . ALLERGIC RHINITIS 12/02/2007  . Personal history of malignant neoplasm of breast 12/02/2007  . History of skin cancer 12/02/2007  . Vitamin D deficiency 12/02/2007    Current Outpatient Medications on File Prior to Visit  Medication Sig Dispense Refill  . azelastine (ASTELIN)  0.1 % nasal spray Place 1 spray into both nostrils 2 (two) times daily. Use in each nostril as directed     . diazepam (VALIUM) 2 MG tablet Take 1 tablet (2 mg total) by mouth every 8 (eight) hours as needed (dizziness). 6 tablet 0  . fluticasone (FLONASE) 50 MCG/ACT nasal spray Place 1 spray into the nose daily.     Marland Kitchen gabapentin (NEURONTIN) 300 MG capsule Take 1 capsule (300 mg total) by mouth 3 (three) times daily. Take a 300 mg capsule three times a day for two weeks following surgery.Then take a 300 mg capsule two times a day for two weeks. Then take a 300 mg capsule once a day for two weeks. Then discontinue. 84 capsule 0  . ipratropium (ATROVENT) 0.06 % nasal spray Place 1 spray into both nostrils 2 (two) times a day.     . levocetirizine (XYZAL) 5 MG tablet Take 5 mg by mouth every evening.    . meclizine (ANTIVERT) 12.5 MG tablet Take 1 tablet (12.5 mg total) by mouth 3 (three) times daily as needed for dizziness. 15 tablet 0  . simvastatin (ZOCOR) 20 MG tablet Take 1 tablet (20 mg total) by mouth every evening. 90 tablet 1  . traMADol (ULTRAM) 50 MG tablet Take 1-2 tablets (50-100 mg total) by mouth every 6 (six) hours as needed for moderate pain. 40 tablet 0   No current facility-administered medications on file prior  to visit.     Past Medical History:  Diagnosis Date  . Arthritis   . Bursitis 2014   Left; Dr Maureen Ralphs  . Cancer ([place]) 1986   breast cancer L  . Hyperlipidemia   . Lymphedema 2000   LUE  . perennial allergies   . PONV (postoperative nausea and vomiting)   . Skin cancer    basil cell    Past Surgical History:  Procedure Laterality Date  . ABDOMINAL HYSTERECTOMY  1990   & BSO for fibroids  . ANKLE FRACTURE SURGERY  2006  . BREAST RECONSTRUCTION  1986  . BREAST SURGERY  1986   double mastectonmy fror cancer on L  . COLONOSCOPY  04/14/2006   Tics ; Dr Earlean Shawl  . KNEE ARTHROSCOPY  2001 & 2011   L & R  . TOTAL KNEE ARTHROPLASTY  02/24/2012   Procedure: TOTAL  KNEE ARTHROPLASTY;  Surgeon: Gearlean Alf, MD;  Location: [facility];  Service: Orthopedics;  Laterality: Right;  . TOTAL KNEE ARTHROPLASTY Left 02/01/2019   Procedure: TOTAL KNEE ARTHROPLASTY;  Surgeon: Gaynelle Arabian, MD;  Location: [facility];  Service: Orthopedics;  Laterality: Left;  64min  . WISDOM TOOTH EXTRACTION      Social History   Socioeconomic History  . Marital status: Married    Spouse name: Not on file  . Number of children: Not on file  . Years of education: Not on file  . Highest education level: Not on file  Occupational History  . Not on file  Social Needs  . Financial resource strain: Not on file  . Food insecurity    Worry: Not on file    Inability: Not on file  . Transportation needs    Medical: Not on file    Non-medical: Not on file  Tobacco Use  . Smoking status: Never Smoker  . Smokeless tobacco: Never Used  Substance and Sexual Activity  . Alcohol use: Yes    Comment:  very rarely, 1-2 per month  . Drug use: No  . Sexual activity: Not on file  Lifestyle  . Physical activity    Days per week: Not on file    Minutes per session: Not on file  . Stress: Not on file  Relationships  . Social Herbalist on phone: Not on file    Gets together: Not on file    Attends religious service: Not on file    Active member of club or organization: Not on file    Attends meetings of clubs or organizations: Not on file    Relationship status: Not on file  Other Topics Concern  . Not on file  Social History Narrative  . Not on file    Family History  Problem Relation Age of Onset  . Hypertension Mother   . Heart disease Mother        pacer  . Colon polyps Mother        pre cancerous  . Glaucoma Mother   . Dementia Mother   . Heart attack Maternal Grandmother 94  . Hypertension Maternal Grandmother   . Stroke Maternal Grandfather 65  . Hypertension Maternal Grandfather   . Heart attack Maternal Uncle 65  . Cancer Neg Hx   . Colon cancer Neg  Hx   . Esophageal cancer Neg Hx   . Rectal cancer Neg Hx   . Stomach cancer Neg Hx   . Pancreatic cancer Neg Hx     Review of  Systems  Constitutional: Negative for chills and fever.  HENT: Positive for trouble swallowing.   Gastrointestinal: Negative for abdominal pain and nausea.       Occ GERD  Neurological: Positive for dizziness. Negative for weakness, numbness and headaches.       Objective:   Vitals:   02/23/19 1452  BP: (!) 142/78  Pulse: 93  Resp: 16  Temp: 98.3 F (36.8 C)  SpO2: 97%   BP Readings from Last 3 Encounters:  02/23/19 (!) 142/78  02/13/19 (!) 149/94  02/02/19 (!) 144/97   Wt Readings from Last 3 Encounters:  02/23/19 229 lb (103.9 kg)  02/01/19 228 lb 8 oz (103.6 kg)  01/28/19 228 lb 8 oz (103.6 kg)   Body mass index is 39.31 kg/m.   Physical Exam    Constitutional: Appears well-developed and well-nourished. No distress.  HENT:  Head: Normocephalic and atraumatic.  Neck: Neck supple. No tracheal deviation present. No thyromegaly present.  No cervical lymphadenopathy Cardiovascular: Normal rate, regular rhythm and normal heart sounds.   No murmur heard. No carotid bruit .  No edema Pulmonary/Chest: Effort normal and breath sounds normal. No respiratory distress. No has no wheezes. No rales. Neurological: CN II-XII intact, nonfocal. Skin: Skin is warm and dry. Not diaphoretic.  Psychiatric: Normal mood and affect. Behavior is normal.      Assessment & Plan:    See Problem List for Assessment and Plan of chronic medical problems.

## 2019-02-23 ENCOUNTER — Ambulatory Visit (INDEPENDENT_AMBULATORY_CARE_PROVIDER_SITE_OTHER): Payer: Medicare HMO | Admitting: Internal Medicine

## 2019-02-23 ENCOUNTER — Other Ambulatory Visit: Payer: Self-pay

## 2019-02-23 ENCOUNTER — Encounter: Payer: Self-pay | Admitting: Internal Medicine

## 2019-02-23 VITALS — BP 142/78 | HR 93 | Temp 98.3°F | Resp 16 | Ht 64.0 in | Wt 229.0 lb

## 2019-02-23 DIAGNOSIS — R42 Dizziness and giddiness: Secondary | ICD-10-CM | POA: Diagnosis not present

## 2019-02-23 DIAGNOSIS — R1310 Dysphagia, unspecified: Secondary | ICD-10-CM | POA: Diagnosis not present

## 2019-02-23 DIAGNOSIS — M25562 Pain in left knee: Secondary | ICD-10-CM | POA: Diagnosis not present

## 2019-02-23 NOTE — Assessment & Plan Note (Signed)
This has gotten worse and she agrees to be referred for further evaluation Will refer to GI She does have occasional GERD, but does not think it is a big deal-discussed that this is often the cause of an esophageal stricture.  She would like to wait until she sees GI before starting medication Referral ordered

## 2019-02-23 NOTE — Assessment & Plan Note (Signed)
Had an MRI in the ED which was negative Possibly labyrinthitis or BPPV Her symptoms are improving She is currently taking Benadryl, which is likely helping She is staying well-hydrated Can try meclizine, nondrowsy Dramamine and if there is no relief with those or she wants to try the Valium she will let me know

## 2019-02-25 DIAGNOSIS — M25562 Pain in left knee: Secondary | ICD-10-CM | POA: Diagnosis not present

## 2019-03-02 DIAGNOSIS — M25562 Pain in left knee: Secondary | ICD-10-CM | POA: Diagnosis not present

## 2019-03-04 DIAGNOSIS — M25562 Pain in left knee: Secondary | ICD-10-CM | POA: Diagnosis not present

## 2019-03-09 DIAGNOSIS — Z471 Aftercare following joint replacement surgery: Secondary | ICD-10-CM | POA: Diagnosis not present

## 2019-03-09 DIAGNOSIS — M25562 Pain in left knee: Secondary | ICD-10-CM | POA: Diagnosis not present

## 2019-03-09 DIAGNOSIS — Z96652 Presence of left artificial knee joint: Secondary | ICD-10-CM | POA: Diagnosis not present

## 2019-03-16 DIAGNOSIS — M25562 Pain in left knee: Secondary | ICD-10-CM | POA: Diagnosis not present

## 2019-03-18 DIAGNOSIS — M25562 Pain in left knee: Secondary | ICD-10-CM | POA: Diagnosis not present

## 2019-03-23 ENCOUNTER — Encounter: Payer: Self-pay | Admitting: Physician Assistant

## 2019-03-23 ENCOUNTER — Ambulatory Visit (INDEPENDENT_AMBULATORY_CARE_PROVIDER_SITE_OTHER): Payer: Medicare HMO | Admitting: Physician Assistant

## 2019-03-23 ENCOUNTER — Other Ambulatory Visit: Payer: Self-pay

## 2019-03-23 VITALS — BP 122/80 | HR 103 | Temp 97.8°F | Ht 64.0 in | Wt 223.2 lb

## 2019-03-23 DIAGNOSIS — Z86010 Personal history of colonic polyps: Secondary | ICD-10-CM | POA: Diagnosis not present

## 2019-03-23 DIAGNOSIS — R1310 Dysphagia, unspecified: Secondary | ICD-10-CM

## 2019-03-23 MED ORDER — OMEPRAZOLE 20 MG PO CPDR: 20.0000 mg | DELAYED_RELEASE_CAPSULE | Freq: Every day | ORAL | 3 refills | Status: DC

## 2019-03-23 MED ORDER — NA SULFATE-K SULFATE-MG SULF 17.5-3.13-1.6 GM/177ML PO SOLN: 1.0000 | ORAL | 0 refills | Status: DC

## 2019-03-23 NOTE — Patient Instructions (Signed)
You have been scheduled for an endoscopy and colonoscopy. Please follow the written instructions given to you at your visit today. Please pick up your prep supplies at the pharmacy within the next 1-3 days. If you use inhalers (even only as needed), please bring them with you on the day of your procedure.  We have sent the following medications to your pharmacy for you to pick up at your convenience: Omeprazole 20 mg daily

## 2019-03-23 NOTE — Progress Notes (Signed)
Chief Complaint: Dysphagia  HPI:    Tanya [name] is a 73 year old Caucasian female with a past medical history as listed below known to Dr. Hilarie Fredrickson, who was referred to me by Binnie Rail, MD for a complaint of dysphagia.      06/26/2016 colonoscopy with 1 5 mm polyp in the ascending colon, 1 6 mm polyp in the back flexure, 1 7 mm polyp in the transverse colon and moderate diverticulosis in the sigmoid and descending colon with internal hemorrhoids.  Pathology with tubular adenomas.  Repeat was recommended in 3 years.    02/23/2019 office visit with PCP, discussed occasional reflux and dysphagia.  Her PCP recommended starting some medication but patient declined until she saw [place].    Today, patient explains that over the past year she has had dysphagia when she is eating mostly meats beef pork or bread.  Over the past 6 months she realizes that she needed to make herself vomit in order for these things to come back up and relieve a sensation of being unable to breathe instead of trying to swallow them down, which has helped.  She tries to chew really well and avoid distraction while eating but occasionally still has problems with this.  Denies problems with liquids.  Also occasional reflux symptoms.  5 years ago was on medication for this but has been able to get by with occasional Tums and water by her bedside table.    Denies fever, chills, weight loss, nausea, vomiting or symptoms that awaken her from sleep.  Past Medical History:  Diagnosis Date  . Arthritis   . Bursitis 2014   Left; Dr Maureen Ralphs  . Cancer ([place]) 1986   breast cancer L  . Hyperlipidemia   . Lymphedema 2000   LUE  . perennial allergies   . PONV (postoperative nausea and vomiting)   . Skin cancer    basil cell    Past Surgical History:  Procedure Laterality Date  . ABDOMINAL HYSTERECTOMY  1990   & BSO for fibroids  . ANKLE FRACTURE SURGERY  2006  . BREAST RECONSTRUCTION  1986  . 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 COLONOSCOPY  04/14/2006   Tics ; Dr Earlean Shawl  . KNEE ARTHROSCOPY  2001 & 2011   L & R  . TOTAL KNEE ARTHROPLASTY  02/24/2012   Procedure: TOTAL KNEE ARTHROPLASTY;  Surgeon: Gearlean Alf, MD;  Location: [facility];  Service: Orthopedics;  Laterality: Right;  . TOTAL KNEE ARTHROPLASTY Left 02/01/2019   Procedure: TOTAL KNEE ARTHROPLASTY;  Surgeon: Gaynelle Arabian, MD;  Location: [facility];  Service: Orthopedics;  Laterality: Left;  57min  . WISDOM TOOTH EXTRACTION      Current Outpatient Medications  Medication Sig Dispense Refill  . azelastine (ASTELIN) 0.1 % nasal spray Place 1 spray into both nostrils 2 (two) times daily. Use in each nostril as directed     . fluticasone (FLONASE) 50 MCG/ACT nasal spray Place 1 spray into the nose daily.     Marland Kitchen gabapentin (NEURONTIN) 300 MG capsule Take 1 capsule (300 mg total) by mouth 3 (three) times daily. Take a 300 mg capsule three times a day for two weeks following surgery.Then take a 300 mg capsule two times a day for two weeks. Then take a 300 mg capsule once a day for two weeks. Then discontinue. 84 capsule 0  . ipratropium (ATROVENT) 0.06 % nasal spray Place 1 spray into both nostrils  2 (two) times a day.     . levocetirizine (XYZAL) 5 MG tablet Take 5 mg by mouth every evening.    . meclizine (ANTIVERT) 12.5 MG tablet Take 1 tablet (12.5 mg total) by mouth 3 (three) times daily as needed for dizziness. 15 tablet 0  . simvastatin (ZOCOR) 20 MG tablet Take 1 tablet (20 mg total) by mouth every evening. 90 tablet 1  . traMADol (ULTRAM) 50 MG tablet Take 1-2 tablets (50-100 mg total) by mouth every 6 (six) hours as needed for moderate pain. 40 tablet 0   No current facility-administered medications for this visit.     Allergies as of 03/23/2019 - Review Complete 02/23/2019  Allergen Reaction Noted  . Hydromorphone  04/22/2012  . Methocarbamol  04/22/2012  . Oxycodone Hives 04/22/2012  . Prednisone  02/12/2012    Family  History  Problem Relation Age of Onset  . Hypertension Mother   . Heart disease Mother        pacer  . Colon polyps Mother        pre cancerous  . Glaucoma Mother   . Dementia Mother   . Heart attack Maternal Grandmother 94  . Hypertension Maternal Grandmother   . Stroke Maternal Grandfather 13  . Hypertension Maternal Grandfather   . Heart attack Maternal Uncle 65  . Cancer Neg Hx   . Colon cancer Neg Hx   . Esophageal cancer Neg Hx   . Rectal cancer Neg Hx   . Stomach cancer Neg Hx   . Pancreatic cancer Neg Hx     Social History   Socioeconomic History  . Marital status: Married    Spouse name: Not on file  . Number of children: Not on file  . Years of education: Not on file  . Highest education level: Not on file  Occupational History  . Not on file  Social Needs  . Financial resource strain: Not on file  . Food insecurity    Worry: Not on file    Inability: Not on file  . Transportation needs    Medical: Not on file    Non-medical: Not on file  Tobacco Use  . Smoking status: Never Smoker  . Smokeless tobacco: Never Used  Substance and Sexual Activity  . Alcohol use: Yes    Comment:  very rarely, 1-2 per month  . Drug use: No  . Sexual activity: Not on file  Lifestyle  . Physical activity    Days per week: Not on file    Minutes per session: Not on file  . Stress: Not on file  Relationships  . Social Herbalist on phone: Not on file    Gets together: Not on file    Attends religious service: Not on file    Active member of club or organization: Not on file    Attends meetings of clubs or organizations: Not on file    Relationship status: Not on file  . Intimate partner violence    Fear of current or ex partner: Not on file    Emotionally abused: Not on file    Physically abused: Not on file    Forced sexual activity: Not on file  Other Topics Concern  . Not on file  Social History Narrative  . Not on file    Review of Systems:     Constitutional: No weight loss, fever or chills Cardiovascular: No chest pain Respiratory: No SOB  Gastrointestinal: See HPI and  otherwise negative   Physical Exam:  Vital signs: BP 122/80 (BP Location: Right Arm, Patient Position: Sitting, Cuff Size: Normal) Comment (Cuff Size): forearm  Pulse (!) 103   Temp 97.8 F (36.6 C) (Oral)   Ht 5\' 4"  (1.626 m)   Wt 223 lb 4 oz (101.3 kg)   BMI 38.32 kg/m   Constitutional:   Pleasant Caucasian female appears to be in NAD, Well developed, Well nourished, alert and cooperative Respiratory: Respirations even and unlabored. Lungs clear to auscultation bilaterally.   No wheezes, crackles, or rhonchi.  Cardiovascular: Normal S1, S2. No MRG. Regular rate and rhythm. No peripheral edema, cyanosis or pallor.  Gastrointestinal:  Soft, nondistended, nontender. No rebound or guarding. Normal bowel sounds. No appreciable masses or hepatomegaly. Rectal:  Not performed.  Psychiatric: Demonstrates good judgement and reason without abnormal affect or behaviors.  MOST RECENT LABS AND IMAGING: CBC    Component Value Date/Time   WBC 8.9 02/12/2019 2013   RBC 3.94 02/12/2019 2013   HGB 12.0 02/12/2019 2013   HCT 37.6 02/12/2019 2013   PLT 535 (H) 02/12/2019 2013   MCV 95.4 02/12/2019 2013   MCH 30.5 02/12/2019 2013   MCHC 31.9 02/12/2019 2013   RDW 12.9 02/12/2019 2013   LYMPHSABS 2.4 09/15/2018 0904   MONOABS 0.7 09/15/2018 0904   EOSABS 0.3 09/15/2018 0904   BASOSABS 0.1 09/15/2018 0904    CMP     Component Value Date/Time   NA 141 02/12/2019 2013   K 5.1 02/12/2019 2013   CL 108 02/12/2019 2013   CO2 24 02/12/2019 2013   GLUCOSE 117 (H) 02/12/2019 2013   BUN 13 02/12/2019 2013   CREATININE 0.79 02/12/2019 2013   CALCIUM 8.9 02/12/2019 2013   PROT 6.1 (L) 02/12/2019 2013   ALBUMIN 3.2 (L) 02/12/2019 2013   AST 22 02/12/2019 2013   ALT 18 02/12/2019 2013   ALKPHOS 73 02/12/2019 2013   BILITOT 0.6 02/12/2019 2013   GFRNONAA >60  02/12/2019 2013   GFRAA >60 02/12/2019 2013    Assessment: 1.  Dysphagia: Worse over the past year to solids, some reflux symptoms; most likely esophageal stricture 2.  History of adenomatous polyps: Last colonoscopy 06/26/2016 with recommendations to repeat in 3 years  Plan: 1.  Patient is due for surveillance colonoscopy due to her history of adenomatous polyps in December.  She would like to go ahead and get this done now with EGD for dysphagia.  Did discuss risks, benefits, limitations and alternatives and the patient agrees to proceed.  These were scheduled with Dr. Hilarie Fredrickson at his next available double slot in October.  Discussed with patient that we could break these procedures apart so she could get the EGD done sooner, but she would like to wait. 2.  Reviewed anti-dysphagia measures including taking small bites, drinking sips of water in between bites and the chin tuck technique. 3.  Prescribed Omeprazole 20 mg daily, 30-60 minutes before breakfast #30 with 3 refills 4.  Patient would like Dr. Hilarie Fredrickson to look at her hemorrhoids when she has her colonoscopy to see if they would be a candidate for banding. 5.  Patient to follow in clinic per recommendations from Dr. Hilarie Fredrickson after time of procedure.  Ellouise Newer, PA-C [facility] 03/23/2019, 11:25 AM  Cc: Binnie Rail, MD

## 2019-03-24 DIAGNOSIS — J300 Vasomotor rhinitis: Secondary | ICD-10-CM | POA: Diagnosis not present

## 2019-04-03 ENCOUNTER — Other Ambulatory Visit: Payer: Self-pay | Admitting: Internal Medicine

## 2019-04-03 DIAGNOSIS — E7849 Other hyperlipidemia: Secondary | ICD-10-CM

## 2019-04-07 ENCOUNTER — Encounter: Payer: Self-pay | Admitting: Internal Medicine

## 2019-04-13 DIAGNOSIS — H52223 Regular astigmatism, bilateral: Secondary | ICD-10-CM | POA: Diagnosis not present

## 2019-04-14 ENCOUNTER — Telehealth: Payer: Self-pay | Admitting: Internal Medicine

## 2019-04-14 NOTE — Progress Notes (Signed)
Addendum: Reviewed and agree with assessment and management plan. Deliah Strehlow M, MD  

## 2019-04-14 NOTE — Telephone Encounter (Signed)

## 2019-04-15 ENCOUNTER — Ambulatory Visit (AMBULATORY_SURGERY_CENTER): Payer: Medicare HMO | Admitting: Internal Medicine

## 2019-04-15 ENCOUNTER — Encounter: Payer: Self-pay | Admitting: Internal Medicine

## 2019-04-15 ENCOUNTER — Other Ambulatory Visit: Payer: Self-pay

## 2019-04-15 VITALS — BP 157/93 | HR 80 | Temp 94.0°F | Resp 17 | Ht 64.0 in | Wt 223.0 lb

## 2019-04-15 DIAGNOSIS — E785 Hyperlipidemia, unspecified: Secondary | ICD-10-CM | POA: Diagnosis not present

## 2019-04-15 DIAGNOSIS — K449 Diaphragmatic hernia without obstruction or gangrene: Secondary | ICD-10-CM

## 2019-04-15 DIAGNOSIS — D129 Benign neoplasm of anus and anal canal: Secondary | ICD-10-CM

## 2019-04-15 DIAGNOSIS — R1319 Other dysphagia: Secondary | ICD-10-CM

## 2019-04-15 DIAGNOSIS — R1310 Dysphagia, unspecified: Secondary | ICD-10-CM

## 2019-04-15 DIAGNOSIS — K621 Rectal polyp: Secondary | ICD-10-CM | POA: Diagnosis not present

## 2019-04-15 DIAGNOSIS — Z86010 Personal history of colonic polyps: Secondary | ICD-10-CM | POA: Diagnosis not present

## 2019-04-15 DIAGNOSIS — D123 Benign neoplasm of transverse colon: Secondary | ICD-10-CM | POA: Diagnosis not present

## 2019-04-15 DIAGNOSIS — D128 Benign neoplasm of rectum: Secondary | ICD-10-CM

## 2019-04-15 DIAGNOSIS — K219 Gastro-esophageal reflux disease without esophagitis: Secondary | ICD-10-CM | POA: Diagnosis not present

## 2019-04-15 MED ORDER — SODIUM CHLORIDE 0.9 % IV SOLN: 500.0000 mL | Freq: Once | INTRAVENOUS | Status: DC

## 2019-04-15 NOTE — Op Note (Signed)
[place] Patient Name: Tanya [name] Procedure Date: 04/15/2019 9:56 AM MRN: [id number] Endoscopist: Jerene Bears , MD Age: 73 Referring MD:  Date of Birth: 09/03/1945 Gender: Female Account #: [id number] Procedure:                Colonoscopy Indications:              High risk colon cancer surveillance: Personal                            history of multiple (3 or more) adenomas, Last                            colonoscopy 3 years ago Medicines:                Monitored Anesthesia Care Procedure:                Pre-Anesthesia Assessment:                           - Prior to the procedure, a History and Physical                            was performed, and patient medications and                            allergies were reviewed. The patient's tolerance of                            previous anesthesia was also reviewed. The risks                            and benefits of the procedure and the sedation                            options and risks were discussed with the patient.                            All questions were answered, and informed consent                            was obtained. Prior Anticoagulants: The patient has                            taken no previous anticoagulant or antiplatelet                            agents. ASA Grade Assessment: II - A patient with                            mild systemic disease. After reviewing the risks                            and benefits, the patient was deemed in  satisfactory condition to undergo the procedure.                           After obtaining informed consent, the colonoscope                            was passed under direct vision. Throughout the                            procedure, the patient's blood pressure, pulse, and                            oxygen saturations were monitored continuously. The                            Colonoscope was introduced through the anus and                             advanced to the cecum, identified by appendiceal                            orifice and ileocecal valve. The colonoscopy was                            performed without difficulty. The patient tolerated                            the procedure well. The quality of the bowel                            preparation was good. The ileocecal valve,                            appendiceal orifice, and rectum were photographed. Scope In: 10:10:35 AM Scope Out: 10:23:50 AM Scope Withdrawal Time: 0 hours 10 minutes 50 seconds  Total Procedure Duration: 0 hours 13 minutes 15 seconds  Findings:                 The digital rectal exam was normal.                           A 3 mm polyp was found in the transverse colon. The                            polyp was sessile. The polyp was removed with a                            cold snare. Resection and retrieval were complete.                           A 4 mm polyp was found in the rectum. The polyp was                            sessile.  The polyp was removed with a cold snare.                            Resection and retrieval were complete.                           Multiple small and large-mouthed diverticula were                            found in the sigmoid colon and descending colon.                           Internal hemorrhoids were found during                            retroflexion. The hemorrhoids were small. Complications:            No immediate complications. Estimated Blood Loss:     Estimated blood loss was minimal. Impression:               - One 3 mm polyp in the transverse colon, removed                            with a cold snare. Resected and retrieved.                           - One 4 mm polyp in the rectum, removed with a cold                            snare. Resected and retrieved.                           - Diverticulosis in the sigmoid colon and in the                            descending  colon.                           - Internal hemorrhoids. Recommendation:           - Patient has a contact number available for                            emergencies. The signs and symptoms of potential                            delayed complications were discussed with the                            patient. Return to normal activities tomorrow.                            Written discharge instructions were provided to the                            patient.                           -  Resume previous diet.                           - Continue present medications.                           - Await pathology results.                           - No repeat colonoscopy due to age. Jerene Bears, MD 04/15/2019 10:34:16 AM This report has been signed electronically.

## 2019-04-15 NOTE — Progress Notes (Signed)
Report to PACU, RN, vss, BBS= Clear.  

## 2019-04-15 NOTE — Patient Instructions (Signed)
Discharge instructions given. Handouts on hiatal hernia,dilatation diet,polyps,diverticulosis and hemorrhoids. Resume previous medications. YOU HAD AN ENDOSCOPIC PROCEDURE TODAY AT [facility]:   Refer to the procedure report that was given to you for any specific questions about what was found during the examination.  If the procedure report does not answer your questions, please call your gastroenterologist to clarify.  If you requested that your care partner not be given the details of your procedure findings, then the procedure report has been included in a sealed envelope for you to review at your convenience later.  YOU SHOULD EXPECT: Some feelings of bloating in the abdomen. Passage of more gas than usual.  Walking can help get rid of the air that was put into your GI tract during the procedure and reduce the bloating. If you had a lower endoscopy (such as a colonoscopy or flexible sigmoidoscopy) you may notice spotting of blood in your stool or on the toilet paper. If you underwent a bowel prep for your procedure, you may not have a normal bowel movement for a few days.  Please Note:  You might notice some irritation and congestion in your nose or some drainage.  This is from the oxygen used during your procedure.  There is no need for concern and it should clear up in a day or so.  SYMPTOMS TO REPORT IMMEDIATELY:   Following lower endoscopy (colonoscopy or flexible sigmoidoscopy):  Excessive amounts of blood in the stool  Significant tenderness or worsening of abdominal pains  Swelling of the abdomen that is new, acute  Fever of 100F or higher   Following upper endoscopy (EGD)  Vomiting of blood or coffee ground material  New chest pain or pain under the shoulder blades  Painful or persistently difficult swallowing  New shortness of breath  Fever of 100F or higher  Black, tarry-looking stools  For urgent or emergent issues, a gastroenterologist can be reached at  any hour by calling [phone].   DIET:  We do recommend a small meal at first, but then you may proceed to your regular diet.  Drink plenty of fluids but you should avoid alcoholic beverages for 24 hours.  ACTIVITY:  You should plan to take it easy for the rest of today and you should NOT DRIVE or use heavy machinery until tomorrow (because of the sedation medicines used during the test).    FOLLOW UP: Our staff will call the number listed on your records 48-72 hours following your procedure to check on you and address any questions or concerns that you may have regarding the information given to you following your procedure. If we do not reach you, we will leave a message.  We will attempt to reach you two times.  During this call, we will ask if you have developed any symptoms of COVID 19. If you develop any symptoms (ie: fever, flu-like symptoms, shortness of breath, cough etc.) before then, please call [phone].  If you test positive for Covid 19 in the 2 weeks post procedure, please call and report this information to [place].    If any biopsies were taken you will be contacted by phone or by letter within the next 1-3 weeks.  Please call us at [phone] if you have not heard about the biopsies in 3 weeks.    SIGNATURES/CONFIDENTIALITY: You and/or your care partner have signed paperwork which will be entered into your electronic medical record.  These signatures attest to the fact that that  the information above on your After Visit Summary has been reviewed and is understood.  Full responsibility of the confidentiality of this discharge information lies with you and/or your care-partner.

## 2019-04-15 NOTE — Op Note (Signed)
[place] Patient Name: Tanya [name] Procedure Date: 04/15/2019 9:56 AM MRN: [id number] Endoscopist: Jerene Bears , MD Age: 73 Referring MD:  Date of Birth: 1946/04/15 Gender: Female Account #: [id number] Procedure:                Upper GI endoscopy Indications:              Dysphagia (solid food and improved with initiation                            of low dose omeprazole 20 mg daily). Medicines:                Monitored Anesthesia Care Procedure:                Pre-Anesthesia Assessment:                           - Prior to the procedure, a History and Physical                            was performed, and patient medications and                            allergies were reviewed. The patient's tolerance of                            previous anesthesia was also reviewed. The risks                            and benefits of the procedure and the sedation                            options and risks were discussed with the patient.                            All questions were answered, and informed consent                            was obtained. Prior Anticoagulants: The patient has                            taken no previous anticoagulant or antiplatelet                            agents. ASA Grade Assessment: II - A patient with                            mild systemic disease. After reviewing the risks                            and benefits, the patient was deemed in                            satisfactory condition to undergo the procedure.  After obtaining informed consent, the endoscope was                            passed under direct vision. Throughout the                            procedure, the patient's blood pressure, pulse, and                            oxygen saturations were monitored continuously. The                            Endoscope was introduced through the mouth, and                            advanced to the second  part of duodenum. The upper                            GI endoscopy was accomplished without difficulty.                            The patient tolerated the procedure well. Scope In: Scope Out: Findings:                 Normal mucosa was found in the entire esophagus.                           A 2 cm hiatal hernia was found. The proximal extent                            of the gastric folds (end of tubular esophagus) was                            38 cm from the incisors. The hiatal narrowing was                            41 cm from the incisors. The Z-line was 38 cm from                            the incisors. Suspicion of ring at GE junction                            (proximal end of hiatal hernia). A TTS dilator was                            passed through the scope. Dilation with a 16-17-18                            mm balloon dilator was performed to 18 mm. The                            dilation site was examined and showed mild mucosal  disruption.                           The entire examined stomach was normal.                           The examined duodenum was normal. Complications:            No immediate complications. Estimated Blood Loss:     Estimated blood loss was minimal. Impression:               - Normal mucosa was found in the entire esophagus.                           - 2 cm hiatal hernia with probable esophageal ring                            at GE junction. Dilated to 18 mm with balloon.                           - Normal stomach.                           - Normal examined duodenum.                           - No specimens collected. Recommendation:           - Patient has a contact number available for                            emergencies. The signs and symptoms of potential                            delayed complications were discussed with the                            patient. Return to normal activities tomorrow.                             Written discharge instructions were provided to the                            patient.                           - Resume previous diet.                           - Continue present medications. Would continue                            omeprazole 20 mg daily x 1 month. If dysphagia                            (trouble swallowing) returns after discontinuation  of omeprazole, then resume once daily).                           - Repeat dilation can be performed when/if needed. Jerene Bears, MD 04/15/2019 10:32:11 AM This report has been signed electronically.

## 2019-04-15 NOTE — Progress Notes (Signed)
Called to room to assist during endoscopic procedure.  Patient ID and intended procedure confirmed with present staff. Received instructions for my participation in the procedure from the performing physician.  

## 2019-04-19 ENCOUNTER — Telehealth: Payer: Self-pay

## 2019-04-19 NOTE — Telephone Encounter (Signed)
  Follow up Call-  Call back number 04/15/2019  Post procedure Call Back phone  # [phone]  Permission to leave phone message Yes  Some recent data might be hidden     Patient questions:  Do you have a fever, pain , or abdominal swelling? No. Pain Score  0 *  Have you tolerated food without any problems? Yes.    Have you been able to return to your normal activities? Yes.    Do you have any questions about your discharge instructions: Diet   No. Medications  No. Follow up visit  No.  Do you have questions or concerns about your Care? No.  Actions: * If pain score is 4 or above: No action needed, pain <4. 1. Have you developed a fever since your procedure? no  2.   Have you had an respiratory symptoms (SOB or cough) since your procedure? no  3.   Have you tested positive for COVID 19 since your procedure no  4.   Have you had any family members/close contacts diagnosed with the COVID 19 since your procedure?  no   If yes to any of these questions please route to Joylene John, RN and Alphonsa Gin, Therapist, sports.

## 2019-04-20 ENCOUNTER — Telehealth: Payer: Self-pay | Admitting: Internal Medicine

## 2019-04-20 NOTE — Telephone Encounter (Signed)
Pt states she has had some gas pains since procedure and wanted to know if she can try gas-x otc, let her know that was fine and she should also walk around and that should help. Pt also states she has seen some pink discharge since procedure but no blood, she knows to notify us if she were to see blood.

## 2019-04-22 ENCOUNTER — Encounter: Payer: Self-pay | Admitting: Internal Medicine

## 2019-04-29 ENCOUNTER — Telehealth: Payer: Self-pay

## 2019-04-29 DIAGNOSIS — R42 Dizziness and giddiness: Secondary | ICD-10-CM

## 2019-04-29 NOTE — Telephone Encounter (Signed)
Copied from [place] [id number]. Topic: Referral - Request for Referral >> Apr 29, 2019  9:31 AM Burchel, Abbi R wrote: Referral for which specialty: Neuro Preferred provider/office: [facility]  Reason for referral: Vertigo

## 2019-04-30 ENCOUNTER — Telehealth: Payer: Self-pay

## 2019-04-30 DIAGNOSIS — S82899S Other fracture of unspecified lower leg, sequela: Secondary | ICD-10-CM

## 2019-04-30 DIAGNOSIS — B351 Tinea unguium: Secondary | ICD-10-CM

## 2019-04-30 NOTE — Telephone Encounter (Signed)
Referral ordered

## 2019-04-30 NOTE — Telephone Encounter (Signed)
Copied from [place] [id number]. Topic: Referral - Request for Referral >> Apr 30, 2019 10:50 AM Scherrie Gerlach wrote: Pt would like referral to [facility].  She states they told her they do not accept her Mcarthur Rossetti, but will accept if her dr sends referral.  Pt states she broke her ankle, and has a toe fungus.

## 2019-05-03 ENCOUNTER — Ambulatory Visit (INDEPENDENT_AMBULATORY_CARE_PROVIDER_SITE_OTHER): Payer: Medicare HMO | Admitting: Podiatry

## 2019-05-03 ENCOUNTER — Ambulatory Visit (INDEPENDENT_AMBULATORY_CARE_PROVIDER_SITE_OTHER): Payer: Medicare HMO

## 2019-05-03 ENCOUNTER — Other Ambulatory Visit: Payer: Self-pay

## 2019-05-03 ENCOUNTER — Other Ambulatory Visit: Payer: Self-pay | Admitting: Podiatry

## 2019-05-03 DIAGNOSIS — B351 Tinea unguium: Secondary | ICD-10-CM | POA: Diagnosis not present

## 2019-05-03 DIAGNOSIS — M79671 Pain in right foot: Secondary | ICD-10-CM

## 2019-05-03 DIAGNOSIS — M76821 Posterior tibial tendinitis, right leg: Secondary | ICD-10-CM

## 2019-05-03 MED ORDER — MELOXICAM 15 MG PO TABS: 15.0000 mg | ORAL_TABLET | Freq: Every day | ORAL | 1 refills | Status: DC

## 2019-05-06 NOTE — Progress Notes (Signed)
    HPI: 73 y.o. female presenting today as a new patient with a chief complaint of pain to the right medial foot that began about three weeks ago while working out. She states she wore the wrong shoes which caused the pain. She reports continued intermittent sharp pain. Compression of the area helps alleviate the pain. Walking and being on the foot makes the symptoms worse.  She also complains of possible fungus to the left great toe that has been present for the past 15 years. She reports associated discoloration and thickening of the nail. She has not had any treatment. There are no modifying factors noted. Patient is here for further evaluation and treatment.   Past Medical History:  Diagnosis Date  . Arthritis   . Bursitis 2014   Left; Dr Maureen Ralphs  . Cancer ([place]) 1986   breast cancer L  . Hyperlipidemia   . Lymphedema 2000   LUE  . perennial allergies   . PONV (postoperative nausea and vomiting)   . Skin cancer    basil cell       Physical Exam: General: The patient is alert and oriented x3 in no acute distress.  Dermatology: Hyperkeratotic, discolored, thickened, onychodystrophy of the left great toenail. Skin is warm, dry and supple bilateral lower extremities. Negative for open lesions or macerations.  Vascular: Palpable pedal pulses bilaterally. No edema or erythema noted. Capillary refill within normal limits.  Neurological: Epicritic and protective threshold grossly intact bilaterally.   Musculoskeletal Exam: Pain on palpation noted to the insertion of the posterior tibial tendon of the right foot. Range of motion within normal limits. Muscle strength 5/5 in all muscle groups bilateral lower extremities.  Radiographic Exam:  Normal osseous mineralization. Joint spaces preserved. No fracture or dislocation identified.    Assessment: 1. Insertional posterior tibial tendinitis right 2. Onychomycosis left great toenail   Plan of Care:  1. Patient was evaluated.  Radiographs were reviewed today. 2. Ankle brace dispensed.  3. Prescription for Meloxicam provided to patient. 4. Appointment with Janett Billow, RN for laser treatment.  5. Return to clinic as needed.    Edrick Kins, DPM [facility]  Dr. Edrick Kins, [address]                                        [place], [place] [address]                Office [phone]  Fax [phone]

## 2019-05-21 ENCOUNTER — Ambulatory Visit (INDEPENDENT_AMBULATORY_CARE_PROVIDER_SITE_OTHER): Payer: Medicare HMO

## 2019-05-21 DIAGNOSIS — B351 Tinea unguium: Secondary | ICD-10-CM

## 2019-05-21 NOTE — Progress Notes (Signed)
Pt presents with mycotic infection of nails 1-5 bilateral.  All other systems are negative  Laser therapy administered to affected nails and tolerated well. All safety precautions were in place.  2nd treatment.  Follow up in 4 weeks     

## 2019-05-31 ENCOUNTER — Ambulatory Visit: Payer: Medicare HMO | Attending: Internal Medicine | Admitting: Physical Therapy

## 2019-05-31 ENCOUNTER — Encounter: Payer: Self-pay | Admitting: Physical Therapy

## 2019-05-31 ENCOUNTER — Other Ambulatory Visit: Payer: Self-pay

## 2019-05-31 DIAGNOSIS — H8112 Benign paroxysmal vertigo, left ear: Secondary | ICD-10-CM | POA: Insufficient documentation

## 2019-05-31 DIAGNOSIS — R42 Dizziness and giddiness: Secondary | ICD-10-CM | POA: Insufficient documentation

## 2019-05-31 DIAGNOSIS — R2681 Unsteadiness on feet: Secondary | ICD-10-CM | POA: Diagnosis not present

## 2019-05-31 DIAGNOSIS — R262 Difficulty in walking, not elsewhere classified: Secondary | ICD-10-CM | POA: Diagnosis not present

## 2019-06-01 NOTE — Therapy (Signed)
[facility] [address] [place], [place], [address] Phone: [phone]   Fax:  [phone]  Physical Therapy Evaluation  Patient Details  Name: Tanya [name] MRN: [id number] Date of Birth: 1946-06-06 Referring Provider (PT): Binnie Rail, MD   Encounter Date: 05/31/2019  PT End of Session - 06/01/19 1949    Visit Number  1    Number of Visits  9    Date for PT Re-Evaluation  07/01/19    Authorization Type  [employer] - prior authorization required.  10th visit PN    PT Start Time  1535    PT Stop Time  1617    PT Time Calculation (min)  42 min       Past Medical History:  Diagnosis Date  . Arthritis   . Bursitis 2014   Left; Dr Maureen Ralphs  . Cancer ([place]) 1986   breast cancer L  . Hyperlipidemia   . Lymphedema 2000   LUE  . perennial allergies   . PONV (postoperative nausea and vomiting)   . Skin cancer    basil cell    Past Surgical History:  Procedure Laterality Date  . ABDOMINAL HYSTERECTOMY  1990   & BSO for fibroids  . ANKLE FRACTURE SURGERY  2006  . BREAST RECONSTRUCTION  1986  . BREAST SURGERY  1986   double mastectonmy fror cancer on L  . COLONOSCOPY  04/14/2006   Tics ; Dr Earlean Shawl  . KNEE ARTHROSCOPY  2001 & 2011   L & R  . TOTAL KNEE ARTHROPLASTY  02/24/2012   Procedure: TOTAL KNEE ARTHROPLASTY;  Surgeon: Gearlean Alf, MD;  Location: [facility];  Service: Orthopedics;  Laterality: Right;  . TOTAL KNEE ARTHROPLASTY Left 02/01/2019   Procedure: TOTAL KNEE ARTHROPLASTY;  Surgeon: Gaynelle Arabian, MD;  Location: [facility];  Service: Orthopedics;  Laterality: Left;  39min  . WISDOM TOOTH EXTRACTION      There were no vitals filed for this visit.   Subjective Assessment - 05/31/19 1542    Subjective  Vertigo began at the end of July after a TKA; walking in her house when the room started spinning - had to get down and crawl; had multiple episodes of emesis.  Went to ED and had MRI and CT scan.   Took 5-6 days to improve.  Returned a few days ago, not as severe but still having a hard time bending down to the floor and rolling over in the bed.  Not able to garden and has a hard time looking down.  Denies changes in vision or hearing, denies tinnitus, denies headaches.    Pertinent History  Arthritis with R and L TKA, Burisits, L breast CA with lymphedema LUE (LUE restricted), basal cell skin CA, posterior tibialis tendonitis    Limitations  Standing;Walking;House hold activities    Diagnostic tests  MRI, CT scan    Patient Stated Goals  Get rid of the dizziness, be able to reach down to the floor and walk outside on uneven surfaces    Currently in Pain?  No/denies         [facility] PT Assessment - 05/31/19 1549      Assessment   Medical Diagnosis  Vertigo    Referring Provider (PT)  Binnie Rail, MD    Onset Date/Surgical Date  04/29/19   date of referral but dizziness began this summer   Prior Therapy  yes for TKA      Precautions  Precautions  Other (comment)    Precaution Comments  Arthritis with R and L TKA, Burisits, L breast CA with lymphedema LUE (LUE restricted), basal cell skin CA, posterior tibialis tendonitis      Restrictions   Other Position/Activity Restrictions  LUE restricted for BP due to lympedema      Balance Screen   Has the patient fallen in the past 6 months  No    Has the patient had a decrease in activity level because of a fear of falling?   Yes    Is the patient reluctant to leave their home because of a fear of falling?   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    Leisure  Gardening      Observation/Other Assessments   Focus on Therapeutic Outcomes (FOTO)   Not performed           Vestibular Assessment - 05/31/19 1551      Symptom Behavior    Subjective history of current problem  First episode was in July; now occuring weekly but as severe    Type of Dizziness   Imbalance;Spinning;"Funny feeling in head"    Frequency of Dizziness  daily    Duration of Dizziness  hours    Symptom Nature  Spontaneous    Aggravating Factors  Forward bending;Rolling to right;Rolling to left;Turning body quickly;Turning head quickly    Relieving Factors  Head stationary;Medication    Progression of Symptoms  Worse      Oculomotor Exam   Oculomotor Alignment  Normal    Ocular ROM  WFL    Spontaneous  Absent    Gaze-induced   Absent    Smooth Pursuits  Intact   but reports feeling nausea   Saccades  Intact;Slow      Oculomotor Exam-Fixation Suppressed    Left Head Impulse  positive    Right Head Impulse  negative      Vestibulo-Ocular Reflex   VOR to Slow Head Movement  Normal   but pt reports nausea   VOR Cancellation  Normal   but pt reported dizziness and nausea     Positional Testing   Dix-Hallpike  Dix-Hallpike Right;Dix-Hallpike Left    Sidelying Test  --    Horizontal Canal Testing  Horizontal Canal Right;Horizontal Canal Left      Dix-Hallpike Right   Dix-Hallpike Right Duration  0    Dix-Hallpike Right Symptoms  No nystagmus      Dix-Hallpike Left   Dix-Hallpike Left Duration  mild symptoms    Dix-Hallpike Left Symptoms  No nystagmus      Sidelying Right   Sidelying Right Duration  0    Sidelying Right Symptoms  Right nystagmus   R rotary     Sidelying Left   Sidelying Left Duration  5 seconds    Sidelying Left Symptoms  Upbeat, left rotatory nystagmus          Objective measurements completed on examination: See above findings.       Vestibular Treatment/Exercise - 05/31/19 1616      Vestibular Treatment/Exercise   Vestibular Treatment Provided  Canalith Repositioning    Canalith Repositioning  Epley Manuever Left       EPLEY MANUEVER LEFT  Number of Reps   1     RESPONSE DETAILS LEFT  unable to  reassess due to symptoms            PT Education - 06/01/19 1949    Education Details  clinical findings, PT POC and goals    Person(s) Educated  Patient    Methods  Explanation    Comprehension  Verbalized understanding          PT Long Term Goals - 06/01/19 1955      PT LONG TERM GOAL #1   Title  Pt will demonstrate independence with balance and vestibular HEP    Time  4    Period  Weeks    Status  New    Target Date  07/01/19      PT LONG TERM GOAL #2   Title  Patient will demonstrate negative positional testing indicating resolution of BPPV    Baseline  L BPPV    Time  4    Period  Weeks    Status  New    Target Date  07/01/19      PT LONG TERM GOAL #3   Title  Pt will demonstrate ability to look down and reach down to floor for various items with no report of dizziness in order to return to work duties and gardening    Time  4    Period  Weeks    Status  New    Target Date  07/01/19      PT LONG TERM GOAL #4   Title  Pt will ambulate >200' over compliant mulch and grass independently performing head turns and body turns to L and R without reports of dizziness to allow pt to return to walking outside and in her garden    Time  4    Period  Weeks    Status  New    Target Date  07/01/19             Plan - 06/01/19 1951    Clinical Impression Statement  Pt is a 73 year old female referred to Neuro OPPT for evaluation of vertigo.  Pt's PMH is significant for the following: Arthritis with R and L TKA, Burisits, L breast CA with lymphedema LUE (LUE restricted), basal cell skin CA, posterior tibialis tendonitis. The following deficits were noted during pt's exam: L rotary, upbeating nystagmus of short duration indicating L posterior canal BPPV, visual motion sensitivity, positive head impulse test to the left indicating L vestibular hypofunction, impaired balance and impaired gait placing patient at increased risk for falls. Pt would benefit from skilled PT to  address these impairments and functional limitations to maximize functional mobility independence and reduce falls risk    Personal Factors and Comorbidities  Comorbidity 3+;Profession    Comorbidities  Arthritis with R and L TKA, Burisits, L breast CA with lymphedema LUE (LUE restricted), basal cell skin CA, posterior tibialis tendonitis    Examination-Activity Limitations  Bed Mobility;Bend;Locomotion Level;Reach Overhead;Stand    Examination-Participation Restrictions  Cleaning;Driving;Other   work   Stability/Clinical Decision Making  Stable/Uncomplicated    Clinical Decision Making  Low    Rehab Potential  Good    PT Frequency  2x / week    PT Duration  4 weeks    PT Treatment/Interventions  ADLs/Self Care Home Management;Canalith Repostioning;Gait training;Stair training;Functional mobility training;Therapeutic activities;Therapeutic exercise;Balance training;Neuromuscular re-education;Patient/family education;Vestibular    PT Next Visit Plan  fill out [facility] form!!  reassess and  treat L BPPV.  Initiate x1 viewing and balance    Consulted and Agree with Plan of Care  Patient       Patient will benefit from skilled therapeutic intervention in order to improve the following deficits and impairments:  Decreased balance, Difficulty walking, Dizziness  Visit Diagnosis: BPPV (benign paroxysmal positional vertigo), left  Dizziness and giddiness  Unsteadiness on feet  Difficulty in walking, not elsewhere classified     Problem List Patient Active Problem List   Diagnosis Date Noted  . Vertigo 02/22/2019  . Pain in left knee 01/25/2019  . Pre-op examination 09/15/2018  . Dysphagia 09/15/2018  . Cough 05/17/2017  . Herpes zoster without complication [id number]  . 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/29/2015  . Essential tremor 12/14/2013  . Osteopenia 06/11/2013  . OA (osteoarthritis) of knee 02/24/2012  . Prediabetes 04/06/2009  . NONTOXIC MULTINODULAR GOITER 03/03/2008  . Hyperlipidemia  12/02/2007  . ALLERGIC RHINITIS 12/02/2007  . Personal history of malignant neoplasm of breast 12/02/2007  . History of skin cancer 12/02/2007  . Vitamin D deficiency 12/02/2007    Rico Junker, PT, DPT 06/01/19    8:03 PM    [facility] [address] [place], [place], [address] Phone: [phone]   Fax:  [phone]  Name: ARHA KOWALCZYK MRN: [id number] Date of Birth: Feb 06, 1946

## 2019-06-02 ENCOUNTER — Ambulatory Visit: Payer: Medicare HMO | Admitting: Physical Therapy

## 2019-06-02 ENCOUNTER — Encounter: Payer: Self-pay | Admitting: Physical Therapy

## 2019-06-02 ENCOUNTER — Other Ambulatory Visit: Payer: Self-pay

## 2019-06-02 DIAGNOSIS — R262 Difficulty in walking, not elsewhere classified: Secondary | ICD-10-CM

## 2019-06-02 DIAGNOSIS — R2681 Unsteadiness on feet: Secondary | ICD-10-CM | POA: Diagnosis not present

## 2019-06-02 DIAGNOSIS — H8112 Benign paroxysmal vertigo, left ear: Secondary | ICD-10-CM | POA: Diagnosis not present

## 2019-06-02 DIAGNOSIS — R42 Dizziness and giddiness: Secondary | ICD-10-CM

## 2019-06-02 NOTE — Patient Instructions (Signed)
Gaze Stabilization: Sitting    Keeping eyes on target on wall 3 feet away, and move head side to side for _30___ seconds. Repeat while moving head up and down with glasses off for __30__ seconds. Do __2 sets, 2_ sessions per day.  Copyright  VHI. All rights reserved.   Gaze Stabilization: Tip Card  1.Target must remain in focus, not blurry, and appear stationary while head is in motion. 2.Perform exercises with small head movements (45 to either side of midline). 3.Increase speed of head motion so long as target is in focus. 4.If you wear eyeglasses, be sure you can see target through lens (therapist will give specific instructions for bifocal / progressive lenses). 5.These exercises may provoke dizziness or nausea. Work through these symptoms. If too dizzy, slow head movement slightly. Rest between each exercise. 6.Exercises demand concentration; avoid distractions.  Copyright  VHI. All rights reserved.

## 2019-06-02 NOTE — Therapy (Signed)
[facility] [address] [place], [place], [address] Phone: [phone]   Fax:  [phone]  Physical Therapy Treatment  Patient Details  Name: Tanya [name] MRN: [id number] Date of Birth: [birth year] Referring Provider (PT): Binnie Rail, MD   Encounter Date: 06/02/2019  PT End of Session - 06/02/19 1320    Visit Number  2    Number of Visits  9    Date for PT Re-Evaluation  07/01/19    Authorization Type  [employer] - prior authorization required.  10th visit PN    PT Start Time  1320    PT Stop Time  1403    PT Time Calculation (min)  43 min    Activity Tolerance  Patient tolerated treatment well    Behavior During Therapy  WFL for tasks assessed/performed       Past Medical History:  Diagnosis Date  . Arthritis   . Bursitis 2014   Left; Dr Maureen Ralphs  . Cancer ([place]) 1986   breast cancer L  . Hyperlipidemia   . Lymphedema 2000   LUE  . perennial allergies   . PONV (postoperative nausea and vomiting)   . Skin cancer    basil cell    Past Surgical History:  Procedure Laterality Date  . ABDOMINAL HYSTERECTOMY  1990   & BSO for fibroids  . ANKLE FRACTURE SURGERY  2006  . BREAST RECONSTRUCTION  1986  . BREAST SURGERY  1986   double mastectonmy fror cancer on L  . COLONOSCOPY  04/14/2006   Tics ; Dr Earlean Shawl  . KNEE ARTHROSCOPY  2001 & 2011   L & R  . TOTAL KNEE ARTHROPLASTY  02/24/2012   Procedure: TOTAL KNEE ARTHROPLASTY;  Surgeon: Gearlean Alf, MD;  Location: [facility];  Service: Orthopedics;  Laterality: Right;  . TOTAL KNEE ARTHROPLASTY Left 02/01/2019   Procedure: TOTAL KNEE ARTHROPLASTY;  Surgeon: Gaynelle Arabian, MD;  Location: [facility];  Service: Orthopedics;  Laterality: Left;  60min  . WISDOM TOOTH EXTRACTION      There were no vitals filed for this visit.  Subjective Assessment - 06/02/19 1320    Subjective  Pt states that she is still experiencing some dizziness at night and with rolling  in bed but has improved significantly since last PT session. She is still taking dramamine regularly. She reports that someone drove her to the PT session today.    Pertinent History  Arthritis with R and L TKA, Burisits, L breast CA with lymphedema LUE (LUE restricted), basal cell skin CA, posterior tibialis tendonitis    Limitations  Standing;Walking;House hold activities    Diagnostic tests  MRI, CT scan    Patient Stated Goals  Get rid of the dizziness, be able to reach down to the floor and walk outside on uneven surfaces             Vestibular Assessment - 06/02/19 1327      Positional Testing   Dix-Hallpike  Dix-Hallpike Left    Horizontal Canal Testing  Horizontal Canal Right;Horizontal Canal Left      Dix-Hallpike Left   Dix-Hallpike Left Duration  mild symptoms, 5 seconds duration    tested x2 with dec sx on 2nd test   Dix-Hallpike Left Symptoms  Upbeat, left rotatory nystagmus      Horizontal Canal Right   Horizontal Canal Right Duration  0    Horizontal Canal Right Symptoms  Normal      Horizontal  Canal Left   Horizontal Canal Left Duration  0    Horizontal Canal Left Symptoms  Normal                Vestibular Treatment/Exercise - 06/02/19 0001      Vestibular Treatment/Exercise   Vestibular Treatment Provided  Canalith Repositioning    Canalith Repositioning  Epley Manuever Left    Gaze Exercises  X1 Viewing Horizontal;X1 Viewing Vertical       EPLEY MANUEVER LEFT   Number of Reps   2    Overall Response   Improved Symptoms      X1 Viewing Horizontal   Foot Position  seated    Comments  x30s practice, x30s with verbal cues for speed and cervical ROM with towel as tactile cue during practice round      X1 Viewing Vertical   Foot Position  seated    Comments  x30s practice, x30s with verbal cues for greater speed and cervical ROM             PT Education - 06/02/19 1401    Education Details  Inital HEP VORx1 horizontal and vertical  seated    Person(s) Educated  Patient    Methods  Explanation;Demonstration;Verbal cues;Handout    Comprehension  Verbalized understanding;Returned demonstration;Verbal cues required          PT Long Term Goals - 06/01/19 1955      PT LONG TERM GOAL #1   Title  Pt will demonstrate independence with balance and vestibular HEP    Time  4    Period  Weeks    Status  New    Target Date  07/01/19      PT LONG TERM GOAL #2   Title  Patient will demonstrate negative positional testing indicating resolution of BPPV    Baseline  L BPPV    Time  4    Period  Weeks    Status  New    Target Date  07/01/19      PT LONG TERM GOAL #3   Title  Pt will demonstrate ability to look down and reach down to floor for various items with no report of dizziness in order to return to work duties and gardening    Time  4    Period  Weeks    Status  New    Target Date  07/01/19      PT LONG TERM GOAL #4   Title  Pt will ambulate >200' over compliant mulch and grass independently performing head turns and body turns to L and R without reports of dizziness to allow pt to return to walking outside and in her garden    Time  4    Period  Weeks    Status  New    Target Date  07/01/19            Plan - 06/02/19 1320    Clinical Impression Statement  Patient had reports of decrease in dizziness sx since last PT session. She demonstrated a positive L Dix Hallpike with L rotary and upbeating nystagmus of a short duration that decreased in intensity after 2 bouts of the Epley maneuver. Pt required mod verbal cueing for VORx1 horizontal and vertical to teach proper form and was able to return demonstration after practice. Pt will benefit from skilled therapy to address deficits and progress towards achieving functional goals.    Personal Factors and Comorbidities  Comorbidity 3+;Profession    Comorbidities  Arthritis with R and L TKA, Burisits, L breast CA with lymphedema LUE (LUE restricted), basal cell  skin CA, posterior tibialis tendonitis    Examination-Activity Limitations  Bed Mobility;Bend;Locomotion Level;Reach Overhead;Stand    Examination-Participation Restrictions  Cleaning;Driving;Other   work   Stability/Clinical Decision Making  Stable/Uncomplicated    Rehab Potential  Good    PT Frequency  2x / week    PT Duration  4 weeks    PT Treatment/Interventions  ADLs/Self Care Home Management;Canalith Repostioning;Gait training;Stair training;Functional mobility training;Therapeutic activities;Therapeutic exercise;Balance training;Neuromuscular re-education;Patient/family education;Vestibular    PT Next Visit Plan  reassess and treat L BPPV.  Re-assess & progress x1 viewing and balance    Consulted and Agree with Plan of Care  Patient       Patient will benefit from skilled therapeutic intervention in order to improve the following deficits and impairments:  Decreased balance, Difficulty walking, Dizziness  Visit Diagnosis: BPPV (benign paroxysmal positional vertigo), left  Dizziness and giddiness  Unsteadiness on feet  Difficulty in walking, not elsewhere classified     Problem List Patient Active Problem List   Diagnosis Date Noted  . Vertigo 02/22/2019  . Pain in left knee 01/25/2019  . Pre-op examination 09/15/2018  . Dysphagia 09/15/2018  . Cough 05/17/2017  . Herpes zoster without complication [id number]  . 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/29/2015  . Essential tremor 12/14/2013  . Osteopenia 06/11/2013  . OA (osteoarthritis) of knee 02/24/2012  . Prediabetes 04/06/2009  . NONTOXIC MULTINODULAR GOITER 03/03/2008  . Hyperlipidemia 12/02/2007  . ALLERGIC RHINITIS 12/02/2007  . Personal history of malignant neoplasm of breast 12/02/2007  . History of skin cancer 12/02/2007  . Vitamin D deficiency 12/02/2007   Juliann Pulse SPT 06/02/2019 3:53 PM  Juliann Pulse 06/02/2019, 3:53 PM  [facility] [address]  [place], [place], [address] Phone: [phone]   Fax:  [phone]  Name: Tanya [name] MRN: [id number] Date of Birth: 03-Dec-1945

## 2019-06-07 ENCOUNTER — Telehealth: Payer: Self-pay | Admitting: Podiatry

## 2019-06-07 ENCOUNTER — Other Ambulatory Visit: Payer: Self-pay

## 2019-06-07 ENCOUNTER — Ambulatory Visit: Payer: Medicare HMO | Admitting: Physical Therapy

## 2019-06-07 ENCOUNTER — Encounter: Payer: Self-pay | Admitting: Physical Therapy

## 2019-06-07 DIAGNOSIS — H8112 Benign paroxysmal vertigo, left ear: Secondary | ICD-10-CM | POA: Diagnosis not present

## 2019-06-07 DIAGNOSIS — R42 Dizziness and giddiness: Secondary | ICD-10-CM | POA: Diagnosis not present

## 2019-06-07 DIAGNOSIS — R2681 Unsteadiness on feet: Secondary | ICD-10-CM

## 2019-06-07 DIAGNOSIS — R262 Difficulty in walking, not elsewhere classified: Secondary | ICD-10-CM | POA: Diagnosis not present

## 2019-06-07 DIAGNOSIS — M76822 Posterior tibial tendinitis, left leg: Secondary | ICD-10-CM | POA: Diagnosis not present

## 2019-06-07 DIAGNOSIS — M76821 Posterior tibial tendinitis, right leg: Secondary | ICD-10-CM | POA: Diagnosis not present

## 2019-06-07 NOTE — Telephone Encounter (Signed)
Pt called back and has spoken to her secondary insurance that she files and they said it would be covered if denied by [employer]. She wants to proceed and is scheduled to see Betha on 12.4.2020

## 2019-06-07 NOTE — Telephone Encounter (Signed)
Pt left message asking for a referral for an orthotic for her right foot.  I returned call and spoke to pt and made her aware that we do the orthotics in our office and that her insurance does not cover it but we would still file it. I also made pt aware of the cost (398.00) and we do have a payment plan.She is going to think about it and let me know if she wants to proceed. Pt did purchase a pair of Dr Zoe Lan and stated that makes her foot dfeel better.

## 2019-06-07 NOTE — Patient Instructions (Addendum)
Gaze Stabilization: Sitting    Keeping eyes on target on wall 3 feet away, and move head side to side for _60___ seconds, slightly faster speed. Repeat while moving head up and down with glasses off for __60__ seconds, slightly faster speed. Do __2 sets, 2_ sessions per day.  Gaze Stabilization: Tip Card  1.Target must remain in focus, not blurry, and appear stationary while head is in motion. 2.Perform exercises with small head movements (45 to either side of midline). 3.Increase speed of head motion so long as target is in focus. 4.If you wear eyeglasses, be sure you can see target through lens (therapist will give specific instructions for bifocal / progressive lenses). 5.These exercises may provoke dizziness or nausea. Work through these symptoms. If too dizzy, slow head movement slightly. Rest between each exercise. 6.Exercises demand concentration; avoid distractions.  Copyright  VHI. All rights reserved.     BENDING TO PICK UP OBJECTS:  -Stand facing your couch/ottoman - place 4-5 cups on the couch seat.  Looking down at the first cup, bend/reach down and pick it up, stand all the way back up.  Repeat with all the cups.  Place cups back down on couch and repeat.  Performed 2 sets of picking up and putting back down.  Perform 2 times a day.

## 2019-06-07 NOTE — Therapy (Signed)
[facility] [address] [place], [place], [address] Phone: [phone]   Fax:  [phone]  Physical Therapy Treatment  Patient Details  Name: Tanya [name] MRN: [id number] Date of Birth: Aug 14, 1945 Referring Provider (PT): Binnie Rail, MD   Encounter Date: 06/07/2019  PT End of Session - 06/07/19 1543    Visit Number  3    Number of Visits  9    Date for PT Re-Evaluation  07/01/19    Authorization Type  [employer] - prior authorization required.  10th visit PN    PT Start Time  1535    PT Stop Time  1615    PT Time Calculation (min)  40 min    Activity Tolerance  Patient tolerated treatment well    Behavior During Therapy  WFL for tasks assessed/performed       Past Medical History:  Diagnosis Date  . Arthritis   . Bursitis 2014   Left; Dr Maureen Ralphs  . Cancer ([place]) 1986   breast cancer L  . Hyperlipidemia   . Lymphedema 2000   LUE  . perennial allergies   . PONV (postoperative nausea and vomiting)   . Skin cancer    basil cell    Past Surgical History:  Procedure Laterality Date  . ABDOMINAL HYSTERECTOMY  1990   & BSO for fibroids  . ANKLE FRACTURE SURGERY  2006  . BREAST RECONSTRUCTION  1986  . BREAST SURGERY  1986   double mastectonmy fror cancer on L  . COLONOSCOPY  04/14/2006   Tics ; Dr Earlean Shawl  . KNEE ARTHROSCOPY  2001 & 2011   L & R  . TOTAL KNEE ARTHROPLASTY  02/24/2012   Procedure: TOTAL KNEE ARTHROPLASTY;  Surgeon: Gearlean Alf, MD;  Location: [facility];  Service: Orthopedics;  Laterality: Right;  . TOTAL KNEE ARTHROPLASTY Left 02/01/2019   Procedure: TOTAL KNEE ARTHROPLASTY;  Surgeon: Gaynelle Arabian, MD;  Location: [facility];  Service: Orthopedics;  Laterality: Left;  34min  . WISDOM TOOTH EXTRACTION      There were no vitals filed for this visit.  Subjective Assessment - 06/07/19 1541    Subjective  Pt states that she is feeling better since last session but still experiences  dizziness when looking down and when waking in the AM. She states being ~70% better. She reports HEP compliance with mild sx.    Pertinent History  Arthritis with R and L TKA, Burisits, L breast CA with lymphedema LUE (LUE restricted), basal cell skin CA, posterior tibialis tendonitis    Limitations  Standing;Walking;House hold activities    Diagnostic tests  MRI, CT scan    Patient Stated Goals  Get rid of the dizziness, be able to reach down to the floor and walk outside on uneven surfaces             Vestibular Assessment - 06/07/19 1544      Positional Testing   Dix-Hallpike  Dix-Hallpike Left    Horizontal Canal Testing  Horizontal Canal Right;Horizontal Canal Left      Dix-Hallpike Left   Dix-Hallpike Left Duration  mild symptoms, 5 seconds duration     Dix-Hallpike Left Symptoms  Other (comment)   no nystagmus seen, pt closed eyes initially for a few second     Sidelying Right   Sidelying Right Duration  0    Sidelying Right Symptoms  No nystagmus      Sidelying Left   Sidelying Left Duration  0    Sidelying Left Symptoms  No nystagmus      Horizontal Canal Right   Horizontal Canal Right Duration  0    Horizontal Canal Right Symptoms  Normal      Horizontal Canal Left   Horizontal Canal Left Duration  0    Horizontal Canal Left Symptoms  Normal      Positional Sensitivities   Right Hallpike  No dizziness   tested x2   Up from Right Hallpike  No dizziness   tested x2   Up from Left Hallpike  No dizziness   tested x2   Nose to Right Knee  Lightheadedness    Right Knee to Sitting  Lightedness    Nose to Left Knee  Lightheadedness    Left Knee to Sitting  Lightheadedness    Positional Sensitivities Comments  Left Hallpike no symptoms (tested x2); Up from R & L/nose to R & L in standing firm surface. Pt demonstrated guarding by dec cervical ROM and eye tracking downward gaze. Pt was cautious and expressed nervousness during standing assessment.   tested x2                Vestibular Treatment/Exercise - 06/07/19 1600      Vestibular Treatment/Exercise   Habituation Exercises  Comment   bend over to pick up a cup off of mat and put back down 4x4     X1 Viewing Horizontal   Foot Position  seated    Comments  x30s no sx, x30s with greater speed no sx, x60s with greater speed with mild sx      X1 Viewing Vertical   Foot Position  seated    Comments  x30s no sx, x30s with greater speed no sx, x60s with greater speed with mild sx   less symptomatic compared to VORx1 horizontal           PT Education - 06/07/19 1613    Education Details  Updated HEP, acclimating in the AM upon waking by doing head turns and body rolls prior to getting up    Person(s) Educated  Patient    Methods  Explanation;Demonstration    Comprehension  Verbalized understanding          PT Long Term Goals - 06/01/19 1955      PT LONG TERM GOAL #1   Title  Pt will demonstrate independence with balance and vestibular HEP    Time  4    Period  Weeks    Status  New    Target Date  07/01/19      PT LONG TERM GOAL #2   Title  Patient will demonstrate negative positional testing indicating resolution of BPPV    Baseline  L BPPV    Time  4    Period  Weeks    Status  New    Target Date  07/01/19      PT LONG TERM GOAL #3   Title  Pt will demonstrate ability to look down and reach down to floor for various items with no report of dizziness in order to return to work duties and gardening    Time  4    Period  Weeks    Status  New    Target Date  07/01/19      PT LONG TERM GOAL #4   Title  Pt will ambulate >200' over compliant mulch and grass independently performing head turns and body turns to L and R without  reports of dizziness to allow pt to return to walking outside and in her garden    Time  4    Period  Weeks    Status  New    Target Date  07/01/19            Plan - 06/07/19 1625    Clinical Impression Statement  Patient  continues to report decrease in dizziness symptoms. She demonstrated negative positional BPPV testing with no reports of vertigo.  Pt did not demonstrate any motion sensitivity to sit <> sidelying to L or R or with sit <> supine. PT was able to progress VORx1 horizontal and vertical in time duration and speed at which head turns are performed. Pt demonstrated guarding and difficulty with bending over to pick up <> put down cups while standing with reports of fear and nervousness of getting the 'dizzy' feeling. Pt will continue to benefit from skilled therapy services to address deficits and progress towards achieving functional goals.    Personal Factors and Comorbidities  Comorbidity 3+;Profession    Comorbidities  Arthritis with R and L TKA, Burisits, L breast CA with lymphedema LUE (LUE restricted), basal cell skin CA, posterior tibialis tendonitis    Examination-Activity Limitations  Bed Mobility;Bend;Locomotion Level;Reach Overhead;Stand    Examination-Participation Restrictions  Cleaning;Driving;Other   work   Stability/Clinical Decision Making  Stable/Uncomplicated    Rehab Potential  Good    PT Frequency  2x / week    PT Duration  4 weeks    PT Treatment/Interventions  ADLs/Self Care Home Management;Canalith Repostioning;Gait training;Stair training;Functional mobility training;Therapeutic activities;Therapeutic exercise;Balance training;Neuromuscular re-education;Patient/family education;Vestibular    PT Next Visit Plan  How is she doing with head turns/rolling to acclimate to movement first thing in AM? Re-assess & progress x1 viewing to standing, progress positional habituation with bending over to pick up objects - add in rotation when appropriate    Consulted and Agree with Plan of Care  Patient       Patient will benefit from skilled therapeutic intervention in order to improve the following deficits and impairments:  Decreased balance, Difficulty walking, Dizziness  Visit  Diagnosis: Dizziness and giddiness  Unsteadiness on feet  Difficulty in walking, not elsewhere classified     Problem List Patient Active Problem List   Diagnosis Date Noted  . Vertigo 02/22/2019  . Pain in left knee 01/25/2019  . Pre-op examination 09/15/2018  . Dysphagia 09/15/2018  . Cough 05/17/2017  . Herpes zoster without complication [id number]  . 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/29/2015  . Essential tremor 12/14/2013  . Osteopenia 06/11/2013  . OA (osteoarthritis) of knee 02/24/2012  . Prediabetes 04/06/2009  . NONTOXIC MULTINODULAR GOITER 03/03/2008  . Hyperlipidemia 12/02/2007  . ALLERGIC RHINITIS 12/02/2007  . Personal history of malignant neoplasm of breast 12/02/2007  . History of skin cancer 12/02/2007  . Vitamin D deficiency 12/02/2007   Juliann Pulse SPT  Juliann Pulse 06/07/2019, 9:45 PM  [facility] [address] [place], [place], [address] Phone: [phone]   Fax:  [phone]  Name: Tanya [name] MRN: [id number] Date of Birth: 1946/05/10

## 2019-06-09 ENCOUNTER — Encounter: Payer: Medicare HMO | Admitting: Physical Therapy

## 2019-06-14 ENCOUNTER — Other Ambulatory Visit: Payer: Self-pay

## 2019-06-14 ENCOUNTER — Encounter: Payer: Self-pay | Admitting: Physical Therapy

## 2019-06-14 ENCOUNTER — Ambulatory Visit: Payer: Medicare HMO | Admitting: Physical Therapy

## 2019-06-14 DIAGNOSIS — H8112 Benign paroxysmal vertigo, left ear: Secondary | ICD-10-CM | POA: Diagnosis not present

## 2019-06-14 DIAGNOSIS — R42 Dizziness and giddiness: Secondary | ICD-10-CM

## 2019-06-14 DIAGNOSIS — R2681 Unsteadiness on feet: Secondary | ICD-10-CM | POA: Diagnosis not present

## 2019-06-14 DIAGNOSIS — R262 Difficulty in walking, not elsewhere classified: Secondary | ICD-10-CM | POA: Diagnosis not present

## 2019-06-14 NOTE — Patient Instructions (Addendum)
Gaze Stabilization - Tip Card  1.Target must remain in focus, not blurry, and appear stationary while head is in motion. 2.Perform exercises with small head movements (45 to either side of midline). 3.Increase speed of head motion so long as target is in focus. 4.If you wear eyeglasses, be sure you can see target through lens (therapist will give specific instructions for bifocal / progressive lenses). 5.These exercises may provoke dizziness or nausea. Work through these symptoms. If too dizzy, slow head movement slightly. Rest between each exercise. 6.Exercises demand concentration; avoid distractions. 7.For safety, perform standing exercises close to a counter, wall, corner, or next to someone.  Copyright  VHI. All rights reserved.   Gaze Stabilization - Standing Feet Apart   Feet togethert, keeping eyes on target on wall 3 feet away, tilt head down slightly and move head side to side for 2 minutes. Repeat while moving head up and down for 2 minutes.  Do 2-3 sessions per day.   Copyright  VHI. All rights reserved.     Reaching from floor to cabinet   1. Use 4-5 cups and place them on the floor in front of a cabinet with an empty shelf that you have to reach up for.  2. One by one, pick up each cone and place it on the cabinet shelf.  3. Once all the cups are on the shelf, one by one, take them down and bend down to put them back on the floor.  4. Repeat 2-3x per day.

## 2019-06-14 NOTE — Therapy (Signed)
[facility] [address] [place], [place], [address] Phone: [phone]   Fax:  [phone]  Physical Therapy Treatment  Patient Details  Name: Tanya [name] MRN: [id number] Date of Birth: 1946/03/24 Referring Provider (PT): Binnie Rail, MD   Encounter Date: 06/14/2019  PT End of Session - 06/14/19 1439    Visit Number  4    Number of Visits  9    Date for PT Re-Evaluation  07/01/19    Authorization Type  [employer] - prior authorization required.  10th visit PN    PT Start Time  1448    PT Stop Time  1533    PT Time Calculation (min)  45 min    Activity Tolerance  Patient tolerated treatment well    Behavior During Therapy  WFL for tasks assessed/performed       Past Medical History:  Diagnosis Date  . Arthritis   . Bursitis 2014   Left; Dr Maureen Ralphs  . Cancer ([place]) 1986   breast cancer L  . Hyperlipidemia   . Lymphedema 2000   LUE  . perennial allergies   . PONV (postoperative nausea and vomiting)   . Skin cancer    basil cell    Past Surgical History:  Procedure Laterality Date  . ABDOMINAL HYSTERECTOMY  1990   & BSO for fibroids  . ANKLE FRACTURE SURGERY  2006  . BREAST RECONSTRUCTION  1986  . BREAST SURGERY  1986   double mastectonmy fror cancer on L  . COLONOSCOPY  04/14/2006   Tics ; Dr Earlean Shawl  . KNEE ARTHROSCOPY  2001 & 2011   L & R  . TOTAL KNEE ARTHROPLASTY  02/24/2012   Procedure: TOTAL KNEE ARTHROPLASTY;  Surgeon: Gearlean Alf, MD;  Location: [facility];  Service: Orthopedics;  Laterality: Right;  . TOTAL KNEE ARTHROPLASTY Left 02/01/2019   Procedure: TOTAL KNEE ARTHROPLASTY;  Surgeon: Gaynelle Arabian, MD;  Location: [facility];  Service: Orthopedics;  Laterality: Left;  40min  . WISDOM TOOTH EXTRACTION      There were no vitals filed for this visit.  Subjective Assessment - 06/14/19 1439    Subjective  Pt states that she was able to bend over and pull out some weeds yesterday and  feels like her dizziness has gotten better. After multiple repetitions she began to feel dizzy when coming back up to standing but once she stopped it went away. She reports HEP compliance and that the rolling/head turns in the AM is going well. She is not getting symptoms with HEP unless she bends over/comes up to standing too fast. She states no dizziness sx while in bed.    Pertinent History  Arthritis with R and L TKA, Burisits, L breast CA with lymphedema LUE (LUE restricted), basal cell skin CA, posterior tibialis tendonitis    Limitations  Standing;Walking;House hold activities    Diagnostic tests  MRI, CT scan    Patient Stated Goals  Get rid of the dizziness, be able to reach down to the floor and walk outside on uneven surfaces    Currently in Pain?  No/denies                        Vestibular Treatment/Exercise - 06/14/19 1512      X1 Viewing Horizontal   Foot Position  seated, standing feet together     Comments  x60s seated no sx, x30s x60s x120s in standing with hand  support LUE no sx      X1 Viewing Vertical   Foot Position  seated, standing feet together    Comments  x60s seated no sx, x30s x60s in standing with hand support LUE no sx         Balance Exercises - 06/14/19 1538      Balance Exercises: Standing   Gait with Head Turns  Forward   R<>L, up/down, diagonals; 2x25' each direction    Other Standing Exercises  Pt performed bending over to pick up 4 cups from various surface heights. Pt performed exercise from low mat, having to rotate trunk to L to pick up cup and rotate R to place cup down, repeated in opposite direction with the 4 cups. Pt performed bending forward to pick cup up and rotating to look over R shoulder to place cup on table behind her and then had to one by one bring each cup back to the mat, repeated while rotating to look over L shoulder. Pt performed bending forward to pick up cups from the floor one by one and place on a high,  empty cabinet shelf and one by one brought each cup back down to the floor. Supervision for all exercises, minimal motion sickness sx reported with looking over pt shoulder repetitively. Overall pt tolerated each progression well.         PT Education - 06/14/19 1546    Education Details  Updated HEP, see pt instructions    Person(s) Educated  Patient    Methods  Explanation;Demonstration;Handout;Verbal cues    Comprehension  Returned demonstration;Verbalized understanding          PT Long Term Goals - 06/01/19 1955      PT LONG TERM GOAL #1   Title  Pt will demonstrate independence with balance and vestibular HEP    Time  4    Period  Weeks    Status  New    Target Date  07/01/19      PT LONG TERM GOAL #2   Title  Patient will demonstrate negative positional testing indicating resolution of BPPV    Baseline  L BPPV    Time  4    Period  Weeks    Status  New    Target Date  07/01/19      PT LONG TERM GOAL #3   Title  Pt will demonstrate ability to look down and reach down to floor for various items with no report of dizziness in order to return to work duties and gardening    Time  4    Period  Weeks    Status  New    Target Date  07/01/19      PT LONG TERM GOAL #4   Title  Pt will ambulate >200' over compliant mulch and grass independently performing head turns and body turns to L and R without reports of dizziness to allow pt to return to walking outside and in her garden    Time  4    Period  Weeks    Status  New    Target Date  07/01/19            Plan - 06/14/19 1440    Clinical Impression Statement  Today's skilled session focused on progressing gaze stabilization and habituation exercises. Pt tolerated VORx1 progression in standing and of longer duration with feet together with no reports of dizziness sx. Pt tolerated habituation exercise progression with reports of motion sickness sx  only with having to look over her shoulder to reach behind self during  exercise. She had some instability with gait and diagonal head turns demonstrated by swaying laterally and inc time to ambulate down the hall. She continues to demonstrate excellent progress with minimal to no sx report.    Personal Factors and Comorbidities  Comorbidity 3+;Profession    Comorbidities  Arthritis with R and L TKA, Burisits, L breast CA with lymphedema LUE (LUE restricted), basal cell skin CA, posterior tibialis tendonitis    Examination-Activity Limitations  Bed Mobility;Bend;Locomotion Level;Reach Overhead;Stand    Examination-Participation Restrictions  Cleaning;Driving;Other   work   Stability/Clinical Decision Making  Stable/Uncomplicated    Rehab Potential  Good    PT Frequency  2x / week    PT Duration  4 weeks    PT Treatment/Interventions  ADLs/Self Care Home Management;Canalith Repostioning;Gait training;Stair training;Functional mobility training;Therapeutic activities;Therapeutic exercise;Balance training;Neuromuscular re-education;Patient/family education;Vestibular    PT Next Visit Plan  how is she tolerating reaching to floor and then to high shelf?  Is recovering from a R ankle sprain - is getting fit for orthotics.  If working on balance, monitor R ankle pain/stability especially on compliant surface. Assess dynamic gait habituation - reaching to floor while ambulating.  If still doing well, may consider D/C.    Consulted and Agree with Plan of Care  Patient       Patient will benefit from skilled therapeutic intervention in order to improve the following deficits and impairments:  Decreased balance, Difficulty walking, Dizziness  Visit Diagnosis: Dizziness and giddiness  Unsteadiness on feet  Difficulty in walking, not elsewhere classified  BPPV (benign paroxysmal positional vertigo), left     Problem List Patient Active Problem List   Diagnosis Date Noted  . Vertigo 02/22/2019  . Pain in left knee 01/25/2019  . Pre-op examination 09/15/2018  .  Dysphagia 09/15/2018  . Cough 05/17/2017  . Herpes zoster without complication [id number]  . 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/29/2015  . Essential tremor 12/14/2013  . Osteopenia 06/11/2013  . OA (osteoarthritis) of knee 02/24/2012  . Prediabetes 04/06/2009  . NONTOXIC MULTINODULAR GOITER 03/03/2008  . Hyperlipidemia 12/02/2007  . ALLERGIC RHINITIS 12/02/2007  . Personal history of malignant neoplasm of breast 12/02/2007  . History of skin cancer 12/02/2007  . Vitamin D deficiency 12/02/2007   Juliann Pulse SPT  Juliann Pulse 06/14/2019, 4:03 PM  [facility] [address] [place], [place], [address] Phone: [phone]   Fax:  [phone]  Name: Tanya [name] MRN: [id number] Date of Birth: 1945/07/29

## 2019-06-16 ENCOUNTER — Other Ambulatory Visit: Payer: Self-pay

## 2019-06-16 ENCOUNTER — Encounter: Payer: Self-pay | Admitting: Physical Therapy

## 2019-06-16 ENCOUNTER — Ambulatory Visit: Payer: Medicare HMO | Attending: Internal Medicine | Admitting: Physical Therapy

## 2019-06-16 DIAGNOSIS — R2681 Unsteadiness on feet: Secondary | ICD-10-CM | POA: Diagnosis not present

## 2019-06-16 DIAGNOSIS — R42 Dizziness and giddiness: Secondary | ICD-10-CM | POA: Diagnosis not present

## 2019-06-16 DIAGNOSIS — H8112 Benign paroxysmal vertigo, left ear: Secondary | ICD-10-CM | POA: Diagnosis not present

## 2019-06-16 DIAGNOSIS — R262 Difficulty in walking, not elsewhere classified: Secondary | ICD-10-CM | POA: Diagnosis not present

## 2019-06-16 NOTE — Therapy (Signed)
[facility] [address] [place], [place], [address] Phone: [phone]   Fax:  [phone]  Physical Therapy Treatment/Discharge Note  Patient Details  Name: Tanya [name] MRN: [id number] Date of Birth: 1945-09-09 Referring Provider (PT): Binnie Rail, MD   Encounter Date: 06/16/2019  PT End of Session - 06/16/19 1455    Visit Number  5    Number of Visits  9    Date for PT Re-Evaluation  07/01/19    Authorization Type  [employer] - prior authorization required.  10th visit PN    PT Start Time  1455    PT Stop Time  1520   D/C early   PT Time Calculation (min)  25 min    Activity Tolerance  Patient tolerated treatment well    Behavior During Therapy  WFL for tasks assessed/performed       Past Medical History:  Diagnosis Date  . Arthritis   . Bursitis 2014   Left; Dr Maureen Ralphs  . Cancer ([place]) 1986   breast cancer L  . Hyperlipidemia   . Lymphedema 2000   LUE  . perennial allergies   . PONV (postoperative nausea and vomiting)   . Skin cancer    basil cell    Past Surgical History:  Procedure Laterality Date  . ABDOMINAL HYSTERECTOMY  1990   & BSO for fibroids  . ANKLE FRACTURE SURGERY  2006  . BREAST RECONSTRUCTION  1986  . BREAST SURGERY  1986   double mastectonmy fror cancer on L  . COLONOSCOPY  04/14/2006   Tics ; Dr Earlean Shawl  . KNEE ARTHROSCOPY  2001 & 2011   L & R  . TOTAL KNEE ARTHROPLASTY  02/24/2012   Procedure: TOTAL KNEE ARTHROPLASTY;  Surgeon: Gearlean Alf, MD;  Location: [facility];  Service: Orthopedics;  Laterality: Right;  . TOTAL KNEE ARTHROPLASTY Left 02/01/2019   Procedure: TOTAL KNEE ARTHROPLASTY;  Surgeon: Gaynelle Arabian, MD;  Location: [facility];  Service: Orthopedics;  Laterality: Left;  78mn  . WISDOM TOOTH EXTRACTION      There were no vitals filed for this visit.  Subjective Assessment - 06/16/19 1453    Subjective  Pt states new HEP exercise with reaching from floor up  high into a cabinet has been going well. Minimal sx after many reps. no dizzy sx in bed.    Pertinent History  Arthritis with R and L TKA, Burisits, L breast CA with lymphedema LUE (LUE restricted), basal cell skin CA, posterior tibialis tendonitis    Limitations  Standing;Walking;House hold activities    Diagnostic tests  MRI, CT scan    Patient Stated Goals  Get rid of the dizziness, be able to reach down to the floor and walk outside on uneven surfaces    Currently in Pain?  No/denies                       [facility] 1610      Ambulation/Gait   Ambulation/Gait  Yes    Ambulation/Gait Assistance  7: Independent    Ambulation Distance (Feet)  500 Feet    Assistive device  None    Gait Pattern  Within Functional Limits;Wide base of support    Ambulation Surface  Indoor;Level;Outdoor;Grass;Paved   mulch    Gait Comments  Pt ambulated in gym and outside over uneven grass surfaces, paved sidewalk, and forward/backward gait in mulch area x3 both directions.  Forward gait in gym with therapist rolling ball toward pt, pt picked up ball and threw it back to therapist for multiple repeitions.      Vestibular Treatment/Exercise - 06/16/19 0001      Vestibular Treatment/Exercise   Vestibular Treatment Provided  Gaze    Gaze Exercises  X1 Viewing Horizontal;X1 Viewing Vertical      X1 Viewing Horizontal   Foot Position  standing on compliant surface (pillows)    Comments  x60s with no sx reproduction       X1 Viewing Vertical   Foot Position  standing on compliant surface (pillows)    Comments  x60s with no sx reproduction          Balance Exercises - 06/16/19 1557      Balance Exercises: Standing   Gait with Head Turns  Forward;Foam/compliant surface   R<>L, up/down, outside on uneven grass    Cone Rotation  Foam/compliant surface;Right turn;Left turn    Cone Rotation Limitations  Pt performed dynamic cone exercises. 1st set pt faced  cone and bent over to pick it up and pass over the L or R shoulder to PT or SPT and would side step to next cone x6 cones. 2nd set pt walked forward, cone would be at pt's side and she bent over to pick up cone and rotated to opposite side in a diagonal pattern repeated in both directions.              PT Long Term Goals - 06/16/19 1500      PT LONG TERM GOAL #1   Title  Pt will demonstrate independence with balance and vestibular HEP    Baseline  06/16/19: MET    Time  4    Period  Weeks    Status  Achieved      PT LONG TERM GOAL #2   Title  Patient will demonstrate negative positional testing indicating resolution of BPPV    Baseline  06/16/19: previously MET    Time  4    Period  Weeks    Status  Achieved      PT LONG TERM GOAL #3   Title  Pt will demonstrate ability to look down and reach down to floor for various items with no report of dizziness in order to return to work duties and gardening    Baseline  06/16/19: MET    Time  4    Period  Weeks    Status  Achieved      PT LONG TERM GOAL #4   Title  Pt will ambulate >200' over compliant mulch and grass independently performing head turns and body turns to L and R without reports of dizziness to allow pt to return to walking outside and in her garden    Baseline  06/16/19: MET    Time  4    Period  Weeks    Status  Achieved            Plan - 06/16/19 1456    Clinical Impression Statement  Patient has achieved all goals early than anticipated. She tolerated tx well with ability to perform all exercises without provocation of sx. Therapist unable to provoke dizziness sx despite progression of all exercises with addition of compliant surfaces and dynamic activity during gait. Patient is safe and appropriate to d/c at this time.    Personal Factors and Comorbidities  Comorbidity 3+;Profession    Comorbidities  Arthritis with R and L TKA, Burisits,  L breast CA with lymphedema LUE (LUE restricted), basal cell skin CA,  posterior tibialis tendonitis    Examination-Activity Limitations  Bed Mobility;Bend;Locomotion Level;Reach Overhead;Stand    Examination-Participation Restrictions  Cleaning;Driving;Other   work   Stability/Clinical Decision Making  Stable/Uncomplicated    Rehab Potential  Good    PT Frequency  2x / week    PT Duration  4 weeks    PT Treatment/Interventions  ADLs/Self Care Home Management;Canalith Repostioning;Gait training;Stair training;Functional mobility training;Therapeutic activities;Therapeutic exercise;Balance training;Neuromuscular re-education;Patient/family education;Vestibular    PT Next Visit Plan  --    Consulted and Agree with Plan of Care  Patient       Patient will benefit from skilled therapeutic intervention in order to improve the following deficits and impairments:  Decreased balance, Difficulty walking, Dizziness  Visit Diagnosis: Dizziness and giddiness  Unsteadiness on feet  Difficulty in walking, not elsewhere classified  BPPV (benign paroxysmal positional vertigo), left     Problem List Patient Active Problem List   Diagnosis Date Noted  . Vertigo 02/22/2019  . Pain in left knee 01/25/2019  . Pre-op examination 09/15/2018  . Dysphagia 09/15/2018  . Cough 05/17/2017  . Herpes zoster without complication 09/26/9456  . 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/29/2015  . Essential tremor 12/14/2013  . Osteopenia 06/11/2013  . OA (osteoarthritis) of knee 02/24/2012  . Prediabetes 04/06/2009  . NONTOXIC MULTINODULAR GOITER 03/03/2008  . Hyperlipidemia 12/02/2007  . ALLERGIC RHINITIS 12/02/2007  . Personal history of malignant neoplasm of breast 12/02/2007  . History of skin cancer 12/02/2007  . Vitamin D deficiency 12/02/2007   Juliann Pulse SPT  Juliann Pulse 06/16/2019, 4:29 PM   PHYSICAL THERAPY DISCHARGE SUMMARY  Visits from Start of Care: 5  Current functional level related to goals / functional outcomes: See LTG achievement and impression  statement above - all goals met   Remaining deficits: none   Education / Equipment: No HEP needed at this time  Plan: Patient agrees to discharge.  Patient goals were met. Patient is being discharged due to meeting the stated rehab goals.  ?????     Rico Junker, PT, DPT 06/16/19    5:04 PM     [facility] [address] [place], [place], [address] Phone: [phone]   Fax:  [phone]  Name: Tanya [name] MRN: [id number] Date of Birth: 01-15-1946

## 2019-06-18 ENCOUNTER — Other Ambulatory Visit: Payer: Self-pay

## 2019-06-18 ENCOUNTER — Other Ambulatory Visit: Payer: Medicare HMO | Admitting: Orthotics

## 2019-06-18 ENCOUNTER — Ambulatory Visit: Payer: Self-pay | Admitting: *Deleted

## 2019-06-18 DIAGNOSIS — B351 Tinea unguium: Secondary | ICD-10-CM

## 2019-06-18 NOTE — Progress Notes (Signed)
Patient presents today for the 2nd laser treatment. Diagnosed with mycotic nail infection by Dr. Amalia Hailey. Toenail most affected is the left hallux nail, medial corner.  All other systems are negative.  Nails were filed thin. Laser therapy was administered to 1st toenail left and patient tolerated the treatment well. All safety precautions were in place.   Follow up in 4 weeks for laser # 3.

## 2019-06-21 ENCOUNTER — Encounter: Payer: Medicare HMO | Admitting: Physical Therapy

## 2019-06-29 ENCOUNTER — Other Ambulatory Visit: Payer: Self-pay | Admitting: Podiatry

## 2019-06-29 ENCOUNTER — Encounter: Payer: Medicare HMO | Admitting: Physical Therapy

## 2019-06-29 NOTE — Telephone Encounter (Signed)
Rx Meloxicam 15mg  sent to pharmacy [facility] [address]

## 2019-07-02 ENCOUNTER — Other Ambulatory Visit: Payer: Self-pay | Admitting: Physician Assistant

## 2019-07-02 ENCOUNTER — Encounter: Payer: Medicare HMO | Admitting: Physical Therapy

## 2019-07-13 ENCOUNTER — Encounter: Payer: Medicare HMO | Admitting: Orthotics

## 2019-07-14 ENCOUNTER — Encounter: Payer: Medicare HMO | Admitting: Orthotics

## 2019-07-23 ENCOUNTER — Other Ambulatory Visit: Payer: Self-pay

## 2019-07-23 ENCOUNTER — Ambulatory Visit: Payer: Medicare HMO | Admitting: Orthotics

## 2019-07-23 ENCOUNTER — Ambulatory Visit (INDEPENDENT_AMBULATORY_CARE_PROVIDER_SITE_OTHER): Payer: Medicare HMO | Admitting: *Deleted

## 2019-07-23 DIAGNOSIS — B351 Tinea unguium: Secondary | ICD-10-CM

## 2019-07-23 DIAGNOSIS — M76821 Posterior tibial tendinitis, right leg: Secondary | ICD-10-CM

## 2019-07-23 NOTE — Progress Notes (Signed)
Patient came in today to pick up custom made foot orthotics.  The goals were accomplished and the patient reported no dissatisfaction with said orthotics.  Patient was advised of breakin period and how to report any issues. 

## 2019-07-23 NOTE — Progress Notes (Signed)
Patient presents today for the 3rd laser treatment. Diagnosed with mycotic nail infection by Dr. Amalia Hailey. Toenail most affected is the left hallux nail, medial corner. She does have a small darkened are at the base of the medial corner. She does not complain of pain though.  All other systems are negative.  Nail was filed thin. Laser therapy was administered to 1st toenail left and patient tolerated the treatment well. All safety precautions were in place.   Follow up in 4 weeks for laser # 4.  Picture of nails taken today to document visual progress

## 2019-08-03 ENCOUNTER — Ambulatory Visit: Payer: Medicare HMO | Attending: Internal Medicine

## 2019-08-03 DIAGNOSIS — Z23 Encounter for immunization: Secondary | ICD-10-CM | POA: Insufficient documentation

## 2019-08-03 NOTE — Progress Notes (Signed)
   Covid-19 Vaccination Clinic  Name:  Tanya [name]    MRN: [id number] DOB: 1946/07/10  08/03/2019  [name] was observed post Covid-19 immunization for 15 minutes without incidence. She was provided with Vaccine Information Sheet and instruction to access the V-Safe system.   [name] was instructed to call 911 with any severe reactions post vaccine: Marland Kitchen Difficulty breathing  . Swelling of your face and throat  . A fast heartbeat  . A bad rash all over your body  . Dizziness and weakness    Immunizations Administered    Name Date Dose VIS Date Route   Pfizer COVID-19 Vaccine 08/03/2019  9:48 AM 0.3 mL 06/25/2019 Intramuscular   Manufacturer: [place]   Lot: [id number]   [place]: [id number]

## 2019-08-20 ENCOUNTER — Other Ambulatory Visit: Payer: Medicare HMO

## 2019-08-24 ENCOUNTER — Ambulatory Visit: Payer: Medicare HMO | Attending: Internal Medicine

## 2019-08-24 DIAGNOSIS — Z23 Encounter for immunization: Secondary | ICD-10-CM | POA: Insufficient documentation

## 2019-08-24 NOTE — Progress Notes (Signed)
   Covid-19 Vaccination Clinic  Name:  Tanya [name]    MRN: [id number] DOB: 1946/03/26  08/24/2019  [name] was observed post Covid-19 immunization for 15 minutes without incidence. She was provided with Vaccine Information Sheet and instruction to access the V-Safe system.   [name] was instructed to call 911 with any severe reactions post vaccine: Marland Kitchen Difficulty breathing  . Swelling of your face and throat  . A fast heartbeat  . A bad rash all over your body  . Dizziness and weakness    Immunizations Administered    Name Date Dose VIS Date Route   Pfizer COVID-19 Vaccine 08/24/2019  9:40 AM 0.3 mL 06/25/2019 Intramuscular   Manufacturer: [employer]   Lot: [id number]   [place]: [id number]

## 2019-08-31 ENCOUNTER — Telehealth: Payer: Self-pay | Admitting: Internal Medicine

## 2019-08-31 NOTE — Progress Notes (Signed)
Chronic Care Management   Note  08/31/2019 Name: Tanya [name] MRN: [id number] DOB: [birth year]  Tanya [name] is a 74 y.o. year old female who is a primary care patient of Burns, Claudina Lick, MD. I reached out to Baron Sane by phone today in response to a referral sent by [name]'s PCP, Binnie Rail, MD.   [name] was given information about Chronic Care Management services today including:  1. CCM service includes personalized support from designated clinical staff supervised by her physician, including individualized plan of care and coordination with other care providers 2. 24/7 contact phone numbers for assistance for urgent and routine care needs. 3. Service will only be billed when office clinical staff spend 20 minutes or more in a month to coordinate care. 4. Only one practitioner may furnish and bill the service in a calendar month. 5. The patient may stop CCM services at any time (effective at the end of the month) by phone call to the office staff. 6. The patient will be responsible for cost sharing (co-pay) of up to 20% of the service fee (after annual deductible is met).  Patient did not agree to services and wishes to consider information provided before deciding about enrollment in care management services.   Follow up plan:   [name] [facility] Scheduler

## 2019-08-31 NOTE — Progress Notes (Signed)
  Chronic Care Management   Outreach Note  08/31/2019 Name: Tanya [name] MRN: [id number] DOB: February 25, 1946  Referred by: Binnie Rail, MD Reason for referral : No chief complaint on file.   An unsuccessful telephone outreach was attempted today. The patient was referred to the pharmacist for assistance with care management and care coordination.   Follow Up Plan:   [name] [facility] Scheduler

## 2019-09-01 ENCOUNTER — Telehealth: Payer: Self-pay | Admitting: Internal Medicine

## 2019-09-01 DIAGNOSIS — E7849 Other hyperlipidemia: Secondary | ICD-10-CM

## 2019-09-01 NOTE — Chronic Care Management (AMB) (Signed)
  Chronic Care Management   Note  09/01/2019 Name: Tanya [name] MRN: [id number] DOB: 1946-02-22  Tanya [name] is a 74 y.o. year old female who is a primary care patient of Burns, Claudina Lick, MD. I reached out to Baron Sane by phone today in response to a referral sent by Tanya [name] PCP, Binnie Rail, MD.   Tanya [name] was given information about Chronic Care Management services today including:  1. CCM service includes personalized support from designated clinical staff supervised by her physician, including individualized plan of care and coordination with other care providers 2. 24/7 contact phone numbers for assistance for urgent and routine care needs. 3. Service will only be billed when office clinical staff spend 20 minutes or more in a month to coordinate care. 4. Only one practitioner may furnish and bill the service in a calendar month. 5. The patient may stop CCM services at any time (effective at the end of the month) by phone call to the office staff. 6. The patient will be responsible for cost sharing (co-pay) of up to 20% of the service fee (after annual deductible is met).  Patient agreed to services and verbal consent obtained.   Follow up plan:   [name] [facility] Scheduler

## 2019-09-03 ENCOUNTER — Ambulatory Visit (INDEPENDENT_AMBULATORY_CARE_PROVIDER_SITE_OTHER): Payer: Medicare HMO | Admitting: *Deleted

## 2019-09-03 ENCOUNTER — Other Ambulatory Visit: Payer: Self-pay

## 2019-09-03 DIAGNOSIS — B351 Tinea unguium: Secondary | ICD-10-CM

## 2019-09-03 NOTE — Progress Notes (Signed)
Patient presents today for the 4th laser treatment. Diagnosed with mycotic nail infection by Dr. Amalia Hailey. Toenail most affected is the left hallux nail, medial corner. The darkened area at the medial border has grown out some. There is some improvements.  All other systems are negative.  Nail was filed thin. Laser therapy was administered to 1st toenail left and patient tolerated the treatment well. All safety precautions were in place.   Follow up in 4 weeks for laser # 5.

## 2019-09-06 ENCOUNTER — Other Ambulatory Visit: Payer: Medicare HMO

## 2019-09-08 ENCOUNTER — Other Ambulatory Visit: Payer: Self-pay

## 2019-09-08 ENCOUNTER — Ambulatory Visit: Payer: Medicare HMO | Admitting: Pharmacist

## 2019-09-08 DIAGNOSIS — E7849 Other hyperlipidemia: Secondary | ICD-10-CM

## 2019-09-08 DIAGNOSIS — R42 Dizziness and giddiness: Secondary | ICD-10-CM

## 2019-09-08 DIAGNOSIS — J309 Allergic rhinitis, unspecified: Secondary | ICD-10-CM

## 2019-09-08 MED ORDER — LEVOCETIRIZINE DIHYDROCHLORIDE 5 MG PO TABS: 5.0000 mg | ORAL_TABLET | Freq: Every evening | ORAL | 1 refills | Status: DC

## 2019-09-08 NOTE — Chronic Care Management (AMB) (Signed)
Chronic Care Management Pharmacy  Name: CHRYSTAL BURGARD  MRN: [id number] DOB: 08-10-1945   Chief Complaint/ HPI  Baron Sane,  74 y.o. , female presents for their Initial CCM visit with the clinical pharmacist via telephone due to COVID-19 Pandemic.  PCP : Binnie Rail, MD  Their chronic conditions include: HLD, allergic rhinitis, osteoarthritis, hx breast cancer (1980s)  Misc:  patient works at [employer].  Office Visits: 02/23/19 Dr Quay Burow: ED f/u for vertigo. Pt currently taking Benadryl for oxycodone allergic rxn, likely helping. Try Meclizine, non-drowsy dramamine. Dysphagia getting worse, referred to GI.   Consult Visit: 07/23/19 [facility] (Dr Amalia Hailey): laser treatment for mycotic nail infection  05/03/19 Dr Amalia Hailey (podiatry): onchomycosis of L toenail. Rx'd meloxicam and set up laser treatment.  03/23/19 [facility] (GI): dysphagia. Rx'd omeprazole 20 mg, scheduled colonoscopy + EGD. Performed 10/1, 2 polyps removed.  02/12/19 ED visit for vertigo/nausea. Given IVF, MRI negative, blood work WNL. Discharged with diazepam and meclizine.  02/01/19: TKA per Dr Wynelle Link. PT through December. Xarelto for DVT prophylaxis x 20 days, then aspirin for 3 weeks then stop.   Medications: Outpatient Encounter Medications as of 09/08/2019  Medication Sig  . azelastine (ASTELIN) 0.1 % nasal spray Place 1 spray into both nostrils 2 (two) times daily. Use in each nostril as directed   . Biotin 10 MG TABS Take 1 tablet by mouth daily.  . Calcium Carb-Cholecalciferol (CALCIUM/VITAMIN D) 500-200 MG-UNIT TABS Take 1 tablet by mouth daily.  Marland Kitchen dimenhyDRINATE (DRAMAMINE) 50 MG tablet Take 50 mg by mouth every 8 (eight) hours as needed for dizziness.  Marland Kitchen ELDERBERRY PO Take 1 capsule by mouth daily.  Marland Kitchen ipratropium (ATROVENT) 0.06 % nasal spray Place 1 spray into both nostrils 2 (two) times a day.   . levocetirizine (XYZAL) 5 MG tablet Take 5 mg by mouth every evening.  . meclizine  (ANTIVERT) 12.5 MG tablet Take 1 tablet (12.5 mg total) by mouth 3 (three) times daily as needed for dizziness.  . Multiple Vitamin (MULTIVITAMIN) tablet Take 1 tablet by mouth daily.  . simvastatin (ZOCOR) 20 MG tablet TAKE 1 TABLET BY MOUTH EVERY DAY IN THE EVENING  . amoxicillin (AMOXIL) 500 MG capsule TAKE 4 CAPS ONE HOUR BEFORE APPT  . [facility] ASPIRIN 325 MG tablet TAKE ONE TABLET TWICE A DAY FOR TWO WEEKS. THEN TAKE ONE 81 MG ASPIRIN ONCE A DAY FOR THREE WEEKS.  . cyclobenzaprine (FLEXERIL) 10 MG tablet   . fluorouracil (EFUDEX) 5 % cream fluorouracil 5 % topical cream  . fluticasone (FLONASE) 50 MCG/ACT nasal spray Place 1 spray into the nose daily.   . meloxicam (MOBIC) 15 MG tablet TAKE 1 TABLET BY MOUTH EVERY DAY  . montelukast (SINGULAIR) 10 MG tablet montelukast 10 mg tablet  . omeprazole (PRILOSEC) 20 MG capsule TAKE 1 CAPSULE BY MOUTH EVERY DAY  . rivaroxaban (XARELTO) 10 MG TABS tablet Xarelto 10 mg tablet  . traMADol (ULTRAM) 50 MG tablet Take 1-2 tablets (50-100 mg total) by mouth every 6 (six) hours as needed for moderate pain. (Patient not taking: Reported on 05/31/2019)   No facility-administered encounter medications on file as of 09/08/2019.    Current Diagnosis/Assessment:  Goals Addressed            This Visit's Progress   . Pharmacy Care Plan       Current Barriers:  . Chronic Disease Management support, education, and care coordination needs related to HLD  and allergic rhinitis  Pharmacist Clinical Goal(s):  Marland Kitchen Maintain LDL (bad cholesterol) < 100 . Ensure safety, efficacy, and affordability of medications . Maintain compliance with medications as prescribed  Interventions: . Comprehensive medication review performed. . Discussed cholesterol and benefits of simvastatin . Discussed benefits of [facility] for medication delivery  Patient Self Care Activities:  . Self administers medications as prescribed, Calls pharmacy for medication refills, and  Calls provider office for new concerns or questions  Initial goal documentation       Hyperlipidemia   Lipid Panel     Component Value Date/Time   CHOL 192 09/15/2018 0904   TRIG 117.0 09/15/2018 0904   HDL 71.30 09/15/2018 0904   CHOLHDL 3 09/15/2018 0904   VLDL 23.4 09/15/2018 0904   LDLCALC 97 09/15/2018 0904    ASCVD 10-year risk: 14.7%  Patient has failed these meds in past: niacin Patient is currently controlled on the following medications: simvastatin 20 mg HS  We discussed:  diet and exercise extensively, role of cholesterol in the body, cholesterol goals, benefits of simvastatin outside of lowering cholesterol.  Diet: Breakfast: protein bar, nuts, protein shake Lunch: salad, protein Dinner: protein, bread, sweets  Plan  Continue current medications and control with diet and exercise   Vertigo   Patient has failed these meds in past: n/a Patient is currently controlled on the following medications: meclizine 12.5 mg TID prn, Dramamine prn  We discussed:  Pt is still having issues with vertigo/dizziness, usually related to positional changes. She has ordered a vertigo wristband and is hopeful this will help.   Plan  Continue current medications  Dysphagia   Patient has tried these meds in past: omeprazole Patient is currently controlled on the following medications: no meds  We discussed: Patient had esophagus stretched, no longer has issues with reflux or dysphagia. She is no longer taking omeprazole.  Plan  Discontinue omeprazole  Allergies   Patient has failed these meds in past: montelukast Patient is currently controlled on the following medications: Astelin, Atrovent, levocetirizine 5 mg daily  We discussed: Patient has to take allergy meds daily, if she misses a day or two she has rhinitis. She also uses neti pot 2-3 times week. She is almost out of levocetirizine. She denies side effects including drowsiness.  Plan  Continue current  medications  Refill levocetirizine  Arthritis/Pain   Patient has failed these meds in past: methocarbamol, gabapentin, tramadol, meloxicam, cyclobenzaprine Patient is currently controlled on the following medications: ibuprofen prn  We discussed:  Patient is off all rx pain meds, she only uses ibuprofen occasionally. She reports she is fully healed from TKA last summer.  Plan  Continue current medications   Health Maintenance   Patient is currently controlled on the following medications: calcium-vitamin D3 600-200, multivitamin, biotin, elderberry  We discussed: pt doing well, denies issues.  Plan  Continue current medications   Medication Management   [facility] Uses pill box Pt endorses 100% compliance  We discussed:  Benefits of medication synchronization and delivery with [facility]. Pt is interested, will think about it and let me know.  Plan  Continue current med management strategy    Follow up: 6 month phone visit  Charlene Brooke, PharmD Clinical Pharmacist [facility] [phone]

## 2019-09-08 NOTE — Progress Notes (Signed)
  I have reviewed this encounter including the documentation in this note and/or discussed this patient with the care management provider. I am certifying that I agree with the content of this note as supervising physician.  Binnie Rail, MD   09/08/2019

## 2019-09-08 NOTE — Patient Instructions (Addendum)
Visit Information  Thank you for meeting with me to discuss your medications! I look forward to working with you to achieve your health care goals. Below is a summary of what we talked about during the visit, and I've included some more information about heart-healthy eating:  Goals Addressed            This Visit's Progress   . Pharmacy Care Plan       Current Barriers:  . Chronic Disease Management support, education, and care coordination needs related to HLD and allergic rhinitis  Pharmacist Clinical Goal(s):  Marland Kitchen Maintain LDL (bad cholesterol) < 100 . Ensure safety, efficacy, and affordability of medications . Maintain compliance with medications as prescribed  Interventions: . Comprehensive medication review performed. . Discussed cholesterol and benefits of simvastatin . Discussed benefits of [facility] for medication delivery  Patient Self Care Activities:  . Self administers medications as prescribed, Calls pharmacy for medication refills, and Calls provider office for new concerns or questions  Initial goal documentation       Ms. Brugger was given information about Chronic Care Management services today including:  1. CCM service includes personalized support from designated clinical staff supervised by her physician, including individualized plan of care and coordination with other care providers 2. 24/7 contact phone numbers for assistance for urgent and routine care needs. 3. Service will only be billed when office clinical staff spend 20 minutes or more in a month to coordinate care. 4. Only one practitioner may furnish and bill the service in a calendar month. 5. The patient may stop CCM services at any time (effective at the end of the month) by phone call to the office staff. 6. The patient will be responsible for cost sharing (co-pay) of up to 20% of the service fee (after annual deductible is met).  Patient agreed to services and verbal consent obtained.    The patient verbalized understanding of instructions provided today and agreed to receive a mailed copy of patient instruction and/or educational materials. Telephone follow up appointment with pharmacy team member scheduled for: 03/09/20 @ 11:30am   Charlene Brooke, PharmD Clinical Pharmacist [facility] [phone]   Heart-Healthy Eating Plan Heart-healthy meal planning includes:  Eating less unhealthy fats.  Eating more healthy fats.  Making other changes in your diet. Talk with your doctor or a diet specialist (dietitian) to create an eating plan that is right for you. What are tips for following this plan? Cooking Avoid frying your food. Try to bake, boil, grill, or broil it instead. You can also reduce fat by:  Removing the skin from poultry.  Removing all visible fats from meats.  Steaming vegetables in water or broth. Meal planning   At meals, divide your plate into four equal parts: ? Fill one-half of your plate with vegetables and green salads. ? Fill one-fourth of your plate with whole grains. ? Fill one-fourth of your plate with lean protein foods.  Eat 4-5 servings of vegetables per day. A serving of vegetables is: ? 1 cup of raw or cooked vegetables. ? 2 cups of raw leafy greens.  Eat 4-5 servings of fruit per day. A serving of fruit is: ? 1 medium whole fruit. ?  cup of dried fruit. ?  cup of fresh, frozen, or canned fruit. ?  cup of 100% fruit juice.  Eat more foods that have soluble fiber. These are apples, broccoli, carrots, beans, peas, and barley. Try to get 20-30 g of  fiber per day.  Eat 4-5 servings of nuts, legumes, and seeds per week: ? 1 serving of dried beans or legumes equals  cup after being cooked. ? 1 serving of nuts is  cup. ? 1 serving of seeds equals 1 tablespoon. General information  Eat more home-cooked food. Eat less restaurant, buffet, and fast food.  Limit or avoid alcohol.  Limit foods  that are high in starch and sugar.  Avoid fried foods.  Lose weight if you are overweight.  Keep track of how much salt (sodium) you eat. This is important if you have high blood pressure. Ask your doctor to tell you more about this.  Try to add vegetarian meals each week. Fats  Choose healthy fats. These include olive oil and canola oil, flaxseeds, walnuts, almonds, and seeds.  Eat more omega-3 fats. These include salmon, mackerel, sardines, tuna, flaxseed oil, and ground flaxseeds. Try to eat fish at least 2 times each week.  Check food labels. Avoid foods with trans fats or high amounts of saturated fat.  Limit saturated fats. ? These are often found in animal products, such as meats, butter, and cream. ? These are also found in plant foods, such as palm oil, palm kernel oil, and coconut oil.  Avoid foods with partially hydrogenated oils in them. These have trans fats. Examples are stick margarine, some tub margarines, cookies, crackers, and other baked goods. What foods can I eat? Fruits All fresh, canned (in natural juice), or frozen fruits. Vegetables Fresh or frozen vegetables (raw, steamed, roasted, or grilled). Green salads. Grains Most grains. Choose whole wheat and whole grains most of the time. Rice and pasta, including brown rice and pastas made with whole wheat. Meats and other proteins Lean, well-trimmed beef, veal, pork, and lamb. Chicken and [place] without skin. All fish and shellfish. Wild duck, rabbit, pheasant, and venison. Egg whites or low-cholesterol egg substitutes. Dried beans, peas, lentils, and tofu. Seeds and most nuts. Dairy Low-fat or nonfat cheeses, including ricotta and mozzarella. Skim or 1% milk that is liquid, powdered, or evaporated. Buttermilk that is made with low-fat milk. Nonfat or low-fat yogurt. Fats and oils Non-hydrogenated (trans-free) margarines. Vegetable oils, including soybean, sesame, sunflower, olive, peanut, safflower, corn,  canola, and cottonseed. Salad dressings or mayonnaise made with a vegetable oil. Beverages Mineral water. Coffee and tea. Diet carbonated beverages. Sweets and desserts Sherbet, gelatin, and fruit ice. Small amounts of dark chocolate. Limit all sweets and desserts. Seasonings and condiments All seasonings and condiments. The items listed above may not be a complete list of foods and drinks you can eat. Contact a dietitian for more options. What foods should I avoid? Fruits Canned fruit in heavy syrup. Fruit in cream or butter sauce. Fried fruit. Limit coconut. Vegetables Vegetables cooked in cheese, cream, or butter sauce. Fried vegetables. Grains Breads that are made with saturated or trans fats, oils, or whole milk. Croissants. Sweet rolls. Donuts. High-fat crackers, such as cheese crackers. Meats and other proteins Fatty meats, such as hot dogs, ribs, sausage, bacon, rib-eye roast or steak. High-fat deli meats, such as salami and bologna. Caviar. Domestic duck and goose. Organ meats, such as liver. Dairy Cream, sour cream, cream cheese, and creamed cottage cheese. Whole-milk cheeses. Whole or 2% milk that is liquid, evaporated, or condensed. Whole buttermilk. Cream sauce or high-fat cheese sauce. Yogurt that is made from whole milk. Fats and oils Meat fat, or shortening. Cocoa butter, hydrogenated oils, palm oil, coconut oil, palm kernel oil. Solid fats and  shortenings, including bacon fat, salt pork, lard, and butter. Nondairy cream substitutes. Salad dressings with cheese or sour cream. Beverages Regular sodas and juice drinks with added sugar. Sweets and desserts Frosting. Pudding. Cookies. Cakes. Pies. Milk chocolate or white chocolate. Buttered syrups. Full-fat ice cream or ice cream drinks. The items listed above may not be a complete list of foods and drinks to avoid. Contact a dietitian for more information. Summary  Heart-healthy meal planning includes eating less unhealthy  fats, eating more healthy fats, and making other changes in your diet.  Eat a balanced diet. This includes fruits and vegetables, low-fat or nonfat dairy, lean protein, nuts and legumes, whole grains, and heart-healthy oils and fats. This information is not intended to replace advice given to you by your health care provider. Make sure you discuss any questions you have with your health care provider. Document Revised: 09/04/2017 Document Reviewed: 08/08/2017 Elsevier Patient Education  2020 [employer].

## 2019-09-08 NOTE — Addendum Note (Signed)
Addended by: Karle Barr on: 09/08/2019 09:06 PM   Modules accepted: Orders

## 2019-09-14 ENCOUNTER — Telehealth: Payer: Self-pay

## 2019-09-14 NOTE — Telephone Encounter (Signed)
New message    The patient calls stating [facility] is waiting on Dr. Quay Burow to approval bone density scan  Appt on 3.5.21   [facility]  [phone]

## 2019-09-14 NOTE — Telephone Encounter (Signed)
Form has been faxed.

## 2019-09-17 DIAGNOSIS — M85852 Other specified disorders of bone density and structure, left thigh: Secondary | ICD-10-CM | POA: Diagnosis not present

## 2019-09-17 DIAGNOSIS — Z1231 Encounter for screening mammogram for malignant neoplasm of breast: Secondary | ICD-10-CM | POA: Diagnosis not present

## 2019-09-17 LAB — HM MAMMOGRAPHY

## 2019-09-17 LAB — HM DEXA SCAN

## 2019-09-21 ENCOUNTER — Encounter: Payer: Self-pay | Admitting: Internal Medicine

## 2019-10-03 ENCOUNTER — Other Ambulatory Visit: Payer: Self-pay | Admitting: Internal Medicine

## 2019-10-03 DIAGNOSIS — E7849 Other hyperlipidemia: Secondary | ICD-10-CM

## 2019-10-04 ENCOUNTER — Ambulatory Visit: Payer: Self-pay | Admitting: *Deleted

## 2019-10-04 ENCOUNTER — Other Ambulatory Visit: Payer: Self-pay

## 2019-10-04 DIAGNOSIS — B351 Tinea unguium: Secondary | ICD-10-CM

## 2019-10-04 NOTE — Progress Notes (Signed)
Patient presents today for the 5th laser treatment. Diagnosed with mycotic nail infection by Dr. Amalia Hailey.   Toenail most affected is the left hallux nail, medial corner. The nail is growing super slow, but there is some clear regrowth at the base.  All other systems are negative.  Nail was filed thin. Laser therapy was administered to 1st toenail left and patient tolerated the treatment well. All safety precautions were in place.   Follow up in 8 weeks for laser # 6.  ~Take final pic next visit~

## 2019-10-25 ENCOUNTER — Other Ambulatory Visit: Payer: Self-pay | Admitting: Internal Medicine

## 2019-10-25 DIAGNOSIS — E7849 Other hyperlipidemia: Secondary | ICD-10-CM

## 2019-11-05 ENCOUNTER — Telehealth: Payer: Self-pay

## 2019-11-05 DIAGNOSIS — E7849 Other hyperlipidemia: Secondary | ICD-10-CM

## 2019-11-05 DIAGNOSIS — R7303 Prediabetes: Secondary | ICD-10-CM

## 2019-11-05 DIAGNOSIS — Z0000 Encounter for general adult medical examination without abnormal findings: Secondary | ICD-10-CM

## 2019-11-05 DIAGNOSIS — E559 Vitamin D deficiency, unspecified: Secondary | ICD-10-CM

## 2019-11-05 NOTE — Telephone Encounter (Signed)
Blood work ordered.

## 2019-11-05 NOTE — Telephone Encounter (Signed)
New message    Need labs order enter into the system prior to appt in June.

## 2019-11-10 ENCOUNTER — Other Ambulatory Visit: Payer: Self-pay | Admitting: Internal Medicine

## 2019-11-16 NOTE — Telephone Encounter (Signed)
Tried calling pt to let her know. No answer and no VM set up 

## 2019-11-17 NOTE — Telephone Encounter (Signed)
Sent mychart message

## 2019-11-18 ENCOUNTER — Other Ambulatory Visit: Payer: Self-pay | Admitting: Internal Medicine

## 2019-11-18 DIAGNOSIS — E7849 Other hyperlipidemia: Secondary | ICD-10-CM

## 2019-11-29 ENCOUNTER — Ambulatory Visit (INDEPENDENT_AMBULATORY_CARE_PROVIDER_SITE_OTHER): Payer: Medicare HMO | Admitting: *Deleted

## 2019-11-29 ENCOUNTER — Other Ambulatory Visit: Payer: Self-pay

## 2019-11-29 DIAGNOSIS — B351 Tinea unguium: Secondary | ICD-10-CM

## 2019-11-29 NOTE — Progress Notes (Signed)
Patient presents today for the 6th laser treatment. Diagnosed with mycotic nail infection by Dr. Amalia Hailey.   Toenail most affected is the left hallux nail, medial corner. There is still a fine, dark line that continues to come back at the medial nail border at the base. She says it doesn't hurt but concerned about what it is. The nail itself does appear to be growing out clear, but slow.  All other systems are negative.  Nail was filed thin. Laser therapy was administered to 1st toenail left and patient tolerated the treatment well. All safety precautions were in place.   Patient has completed the recommended laser treatments. She will follow up with Dr. Amalia Hailey in 3 months to evaluate progress.   ~Final pic of nail taken today~

## 2019-12-16 NOTE — Patient Instructions (Addendum)
Blood work was ordered.    All other Health Maintenance issues reviewed.   All recommended immunizations and age-appropriate screenings are up-to-date or discussed.  No immunization administered today.   Medications reviewed and updated.  Changes include :   Trazodone 25-50 mg nightly  Your prescription(s) have been submitted to your pharmacy. Please take as directed and contact our office if you believe you are having problem(s) with the medication(s).    Please followup in 1 year    Health Maintenance, Female Adopting a healthy lifestyle and getting preventive care are important in promoting health and wellness. Ask your health care provider about:  The right schedule for you to have regular tests and exams.  Things you can do on your own to prevent diseases and keep yourself healthy. What should I know about diet, weight, and exercise? Eat a healthy diet   Eat a diet that includes plenty of vegetables, fruits, low-fat dairy products, and lean protein.  Do not eat a lot of foods that are high in solid fats, added sugars, or sodium. Maintain a healthy weight Body mass index (BMI) is used to identify weight problems. It estimates body fat based on height and weight. Your health care provider can help determine your BMI and help you achieve or maintain a healthy weight. Get regular exercise Get regular exercise. This is one of the most important things you can do for your health. Most adults should:  Exercise for at least 150 minutes each week. The exercise should increase your heart rate and make you sweat (moderate-intensity exercise).  Do strengthening exercises at least twice a week. This is in addition to the moderate-intensity exercise.  Spend less time sitting. Even light physical activity can be beneficial. Watch cholesterol and blood lipids Have your blood tested for lipids and cholesterol at 76 years of age, then have this test every 5 years. Have your cholesterol  levels checked more often if:  Your lipid or cholesterol levels are high.  You are older than 76 years of age.  You are at high risk for heart disease. What should I know about cancer screening? Depending on your health history and family history, you may need to have cancer screening at various ages. This may include screening for:  Breast cancer.  Cervical cancer.  Colorectal cancer.  Skin cancer.  Lung cancer. What should I know about heart disease, diabetes, and high blood pressure? Blood pressure and heart disease  High blood pressure causes heart disease and increases the risk of stroke. This is more likely to develop in people who have high blood pressure readings, are of African descent, or are overweight.  Have your blood pressure checked: ? Every 3-5 years if you are 86-60 years of age. ? Every year if you are 36 years old or older. Diabetes Have regular diabetes screenings. This checks your fasting blood sugar level. Have the screening done:  Once every three years after age 51 if you are at a normal weight and have a low risk for diabetes.  More often and at a younger age if you are overweight or have a high risk for diabetes. What should I know about preventing infection? Hepatitis B If you have a higher risk for hepatitis B, you should be screened for this virus. Talk with your health care provider to find out if you are at risk for hepatitis B infection. Hepatitis C Testing is recommended for:  Everyone born from [birth year] through 1967.  Anyone with known risk  factors for hepatitis C. Sexually transmitted infections (STIs)  Get screened for STIs, including gonorrhea and chlamydia, if: ? You are sexually active and are younger than 76 years of age. ? You are older than 76 years of age and your health care provider tells you that you are at risk for this type of infection. ? Your sexual activity has changed since you were last screened, and you are at increased  risk for chlamydia or gonorrhea. Ask your health care provider if you are at risk.  Ask your health care provider about whether you are at high risk for HIV. Your health care provider may recommend a prescription medicine to help prevent HIV infection. If you choose to take medicine to prevent HIV, you should first get tested for HIV. You should then be tested every 3 months for as long as you are taking the medicine. Pregnancy  If you are about to stop having your period (premenopausal) and you may become pregnant, seek counseling before you get pregnant.  Take 400 to 800 micrograms (mcg) of folic acid every day if you become pregnant.  Ask for birth control (contraception) if you want to prevent pregnancy. Osteoporosis and menopause Osteoporosis is a disease in which the bones lose minerals and strength with aging. This can result in bone fractures. If you are 62 years old or older, or if you are at risk for osteoporosis and fractures, ask your health care provider if you should:  Be screened for bone loss.  Take a calcium or vitamin D supplement to lower your risk of fractures.  Be given hormone replacement therapy (HRT) to treat symptoms of menopause. Follow these instructions at home: Lifestyle  Do not use any products that contain nicotine or tobacco, such as cigarettes, e-cigarettes, and chewing tobacco. If you need help quitting, ask your health care provider.  Do not use street drugs.  Do not share needles.  Ask your health care provider for help if you need support or information about quitting drugs. Alcohol use  Do not drink alcohol if: ? Your health care provider tells you not to drink. ? You are pregnant, may be pregnant, or are planning to become pregnant.  If you drink alcohol: ? Limit how much you use to 0-1 drink a day. ? Limit intake if you are breastfeeding.  Be aware of how much alcohol is in your drink. In the [place] one drink equals one 12 oz bottle of beer  (355 mL), one 5 oz glass of wine (148 mL), or one 1 oz glass of hard liquor (44 mL). General instructions  Schedule regular health, dental, and eye exams.  Stay current with your vaccines.  Tell your health care provider if: ? You often feel depressed. ? You have ever been abused or do not feel safe at home. Summary  Adopting a healthy lifestyle and getting preventive care are important in promoting health and wellness.  Follow your health care provider's instructions about healthy diet, exercising, and getting tested or screened for diseases.  Follow your health care provider's instructions on monitoring your cholesterol and blood pressure. This information is not intended to replace advice given to you by your health care provider. Make sure you discuss any questions you have with your health care provider. Document Revised: 06/24/2018 Document Reviewed: 06/24/2018 Elsevier Patient Education  2020 [employer].

## 2019-12-16 NOTE — Progress Notes (Signed)
Subjective:    Patient ID: Tanya [name], female    DOB: 11/02/1945, 74 y.o.   MRN: [id number]  HPI She is here for a physical exam.   She has intermittent vertigo.  She did PT.  It helped.  She has vertigo 4-5 times a week still.  She is doing her exercises at home, but probably could do them longer.  More PT may help.    She is not sleeping much - watching her mom.  She averages 3 hrs.  She would love something to help her sleep.  She would love to get 6 hrs.    Medications and allergies reviewed with patient and updated if appropriate.  Patient Active Problem List   Diagnosis Date Noted  . Insomnia 12/17/2019  . Vertigo 02/22/2019  . Pain in left knee 01/25/2019  . Dysphagia 09/15/2018  . Cough 05/17/2017  . Herpes zoster without complication [id number]  . 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/29/2015  . Essential tremor 12/14/2013  . Osteopenia 06/11/2013  . OA (osteoarthritis) of knee 02/24/2012  . Prediabetes 04/06/2009  . NONTOXIC MULTINODULAR GOITER 03/03/2008  . Hyperlipidemia 12/02/2007  . Allergic rhinitis 12/02/2007  . Personal history of malignant neoplasm of breast 12/02/2007  . History of skin cancer 12/02/2007  . Vitamin D deficiency 12/02/2007    Current Outpatient Medications on File Prior to Visit  Medication Sig Dispense Refill  . azelastine (ASTELIN) 0.1 % nasal spray Place 1 spray into both nostrils 2 (two) times daily. Use in each nostril as directed     . Biotin 10 MG TABS Take 1 tablet by mouth daily.    . Calcium Carb-Cholecalciferol (CALCIUM/VITAMIN D) 500-200 MG-UNIT TABS Take 1 tablet by mouth daily.    . cetirizine (ZYRTEC) 10 MG tablet Take 1 tablet (10 mg total) by mouth daily. Keep scheduled appt for future refills 90 tablet 0  . dimenhyDRINATE (DRAMAMINE) 50 MG tablet Take 50 mg by mouth every 8 (eight) hours as needed for dizziness.    Marland Kitchen ELDERBERRY PO Take 1 capsule by mouth daily.    . fluticasone (FLONASE) 50 MCG/ACT nasal spray Place 1 spray into  the nose daily.     Marland Kitchen ipratropium (ATROVENT) 0.06 % nasal spray Place 1 spray into both nostrils 2 (two) times a day.     . levocetirizine (XYZAL) 5 MG tablet Take 1 tablet (5 mg total) by mouth every evening. 90 tablet 1  . meclizine (ANTIVERT) 12.5 MG tablet Take 1 tablet (12.5 mg total) by mouth 3 (three) times daily as needed for dizziness. 15 tablet 0  . Multiple Vitamin (MULTIVITAMIN) tablet Take 1 tablet by mouth daily.    . simvastatin (ZOCOR) 20 MG tablet TAKE 1 TABLET BY MOUTH DAILY IN THE EVENING. **NEED OFFICE VISIT FOR MORE REFILLS** 30 tablet 0   No current facility-administered medications on file prior to visit.    Past Medical History:  Diagnosis Date  . Arthritis   . Bursitis 2014   Left; Dr Maureen Ralphs  . Cancer ([place]) 1986   breast cancer L  . Hyperlipidemia   . Lymphedema 2000   LUE  . perennial allergies   . PONV (postoperative nausea and vomiting)   . Skin cancer    basil cell    Past Surgical History:  Procedure Laterality Date  . ABDOMINAL HYSTERECTOMY  1990   & BSO for fibroids  . ANKLE FRACTURE SURGERY  2006  . BREAST RECONSTRUCTION  1986  . [place]  double mastectonmy fror cancer on L  . COLONOSCOPY  04/14/2006   Tics ; Dr Earlean Shawl  . KNEE ARTHROSCOPY  2001 & 2011   L & R  . TOTAL KNEE ARTHROPLASTY  02/24/2012   Procedure: TOTAL KNEE ARTHROPLASTY;  Surgeon: Gearlean Alf, MD;  Location: [facility];  Service: Orthopedics;  Laterality: Right;  . TOTAL KNEE ARTHROPLASTY Left 02/01/2019   Procedure: TOTAL KNEE ARTHROPLASTY;  Surgeon: Gaynelle Arabian, MD;  Location: [facility];  Service: Orthopedics;  Laterality: Left;  68min  . WISDOM TOOTH EXTRACTION      Social History   Socioeconomic History  . Marital status: Married    Spouse name: Not on file  . Number of children: Not on file  . Years of education: Not on file  . Highest education level: Not on file  Occupational History  . Not on file  Tobacco Use  . Smoking status: Never Smoker  .  Smokeless tobacco: Never Used  Substance and Sexual Activity  . Alcohol use: Yes    Comment:  very rarely, 1-2 per month  . Drug use: No  . Sexual activity: Not Currently  Other Topics Concern  . Not on file  Social History Narrative  . Not on file   Social Determinants of Health   Financial Resource Strain:   . Difficulty of Paying Living Expenses:   Food Insecurity:   . Worried About Charity fundraiser in the Last Year:   . Arboriculturist in the Last Year:   Transportation Needs:   . Film/video editor (Medical):   Marland Kitchen Lack of Transportation (Non-Medical):   Physical Activity:   . Days of Exercise per Week:   . Minutes of Exercise per Session:   Stress:   . Feeling of Stress :   Social Connections:   . Frequency of Communication with Friends and Family:   . Frequency of Social Gatherings with Friends and Family:   . Attends Religious Services:   . Active Member of Clubs or Organizations:   . Attends Archivist Meetings:   Marland Kitchen Marital Status:     Family History  Problem Relation Age of Onset  . Hypertension Mother   . Heart disease Mother        pacer  . Colon polyps Mother        pre cancerous  . Glaucoma Mother   . Dementia Mother   . Heart attack Maternal Grandmother 94  . Hypertension Maternal Grandmother   . Stroke Maternal Grandfather 31  . Hypertension Maternal Grandfather   . Heart attack Maternal Uncle 65  . Cancer Neg Hx   . Colon cancer Neg Hx   . Esophageal cancer Neg Hx   . Rectal cancer Neg Hx   . Stomach cancer Neg Hx   . Pancreatic cancer Neg Hx     Review of Systems  Constitutional: Negative for chills and fever.  HENT: Negative for congestion, ear pain, hearing loss, sinus pain and trouble swallowing.   Respiratory: Negative for cough, shortness of breath and wheezing.   Cardiovascular: Negative for chest pain, palpitations and leg swelling.  Gastrointestinal: Negative for abdominal pain, blood in stool, constipation,  diarrhea and nausea.       No gerd  Genitourinary: Negative for dysuria and hematuria.  Musculoskeletal: Negative for arthralgias and back pain.  Skin: Negative for color change and rash.  Neurological: Positive for dizziness. Negative for numbness and headaches.  Psychiatric/Behavioral: Positive for sleep disturbance.  Negative for dysphoric mood. The patient is not nervous/anxious.        Objective:   Vitals:   12/17/19 1101  BP: (!) 140/92  Pulse: 86  Resp: 16  Temp: 98 F (36.7 C)  SpO2: 98%   Filed Weights   12/17/19 1101  Weight: 231 lb (104.8 kg)   Body mass index is 39.65 kg/m.  BP Readings from Last 3 Encounters:  12/17/19 (!) 140/92  04/15/19 (!) 157/93  03/23/19 122/80    Wt Readings from Last 3 Encounters:  12/17/19 231 lb (104.8 kg)  04/15/19 223 lb (101.2 kg)  03/23/19 223 lb 4 oz (101.3 kg)     Physical Exam Constitutional: She appears well-developed and well-nourished. No distress.  HENT:  Head: Normocephalic and atraumatic.  Right Ear: External ear normal. Normal ear canal and TM Left Ear: External ear normal.  Normal ear canal and TM Mouth/Throat: Oropharynx is clear and moist.  Eyes: Conjunctivae and EOM are normal.  Neck: Neck supple. No tracheal deviation present. No thyromegaly present.  No carotid bruit  Cardiovascular: Normal rate, regular rhythm and normal heart sounds.   No murmur heard.  No edema. Pulmonary/Chest: Effort normal and breath sounds normal. No respiratory distress. She has no wheezes. She has no rales.  Breast: deferred   Abdominal: Soft. She exhibits no distension. There is no tenderness.  Lymphadenopathy: She has no cervical adenopathy.  Skin: Skin is warm and dry. She is not diaphoretic.  Psychiatric: She has a normal mood and affect. Her behavior is normal.        Assessment & Plan:   Physical exam: Screening blood work    ordered Immunizations  tdap due, others up to date Colonoscopy  Aged out Mammogram   Up to date  Gyn  Up to date  Dexa   Up to date  Eye exams  scheduled Exercise  none Weight  Advised weight loss - gained weight over past year Sees derm annually Substance abuse   none  See Problem List for Assessment and Plan of chronic medical problems.     This visit occurred during the SARS-CoV-2 public health emergency.  Safety protocols were in place, including screening questions prior to the visit, additional usage of staff PPE, and extensive cleaning of exam room while observing appropriate contact time as indicated for disinfecting solutions.

## 2019-12-17 ENCOUNTER — Other Ambulatory Visit: Payer: Self-pay

## 2019-12-17 ENCOUNTER — Encounter: Payer: Self-pay | Admitting: Internal Medicine

## 2019-12-17 ENCOUNTER — Other Ambulatory Visit: Payer: Self-pay | Admitting: Internal Medicine

## 2019-12-17 ENCOUNTER — Ambulatory Visit (INDEPENDENT_AMBULATORY_CARE_PROVIDER_SITE_OTHER): Payer: Medicare HMO | Admitting: Internal Medicine

## 2019-12-17 VITALS — BP 140/92 | HR 86 | Temp 98.0°F | Resp 16 | Ht 64.0 in | Wt 231.0 lb

## 2019-12-17 DIAGNOSIS — E559 Vitamin D deficiency, unspecified: Secondary | ICD-10-CM | POA: Diagnosis not present

## 2019-12-17 DIAGNOSIS — G4700 Insomnia, unspecified: Secondary | ICD-10-CM | POA: Insufficient documentation

## 2019-12-17 DIAGNOSIS — G4709 Other insomnia: Secondary | ICD-10-CM | POA: Diagnosis not present

## 2019-12-17 DIAGNOSIS — Z0000 Encounter for general adult medical examination without abnormal findings: Secondary | ICD-10-CM | POA: Diagnosis not present

## 2019-12-17 DIAGNOSIS — R7303 Prediabetes: Secondary | ICD-10-CM | POA: Diagnosis not present

## 2019-12-17 DIAGNOSIS — M8580 Other specified disorders of bone density and structure, unspecified site: Secondary | ICD-10-CM | POA: Diagnosis not present

## 2019-12-17 DIAGNOSIS — R42 Dizziness and giddiness: Secondary | ICD-10-CM | POA: Diagnosis not present

## 2019-12-17 DIAGNOSIS — E7849 Other hyperlipidemia: Secondary | ICD-10-CM | POA: Diagnosis not present

## 2019-12-17 DIAGNOSIS — R1310 Dysphagia, unspecified: Secondary | ICD-10-CM | POA: Diagnosis not present

## 2019-12-17 LAB — CBC WITH DIFFERENTIAL/PLATELET
Basophils Absolute: 0.1 10*3/uL (ref 0.0–0.1)
Basophils Relative: 1 % (ref 0.0–3.0)
Eosinophils Absolute: 0.3 10*3/uL (ref 0.0–0.7)
Eosinophils Relative: 3.3 % (ref 0.0–5.0)
HCT: 42.8 % (ref 36.0–46.0)
Hemoglobin: 14.2 g/dL (ref 12.0–15.0)
Lymphocytes Relative: 20.8 % (ref 12.0–46.0)
Lymphs Abs: 1.8 10*3/uL (ref 0.7–4.0)
MCHC: 33.3 g/dL (ref 30.0–36.0)
MCV: 91.8 fl (ref 78.0–100.0)
Monocytes Absolute: 0.8 10*3/uL (ref 0.1–1.0)
Monocytes Relative: 8.7 % (ref 3.0–12.0)
Neutro Abs: 5.8 10*3/uL (ref 1.4–7.7)
Neutrophils Relative %: 66.2 % (ref 43.0–77.0)
Platelets: 345 10*3/uL (ref 150.0–400.0)
RBC: 4.66 Mil/uL (ref 3.87–5.11)
RDW: 13.3 % (ref 11.5–15.5)
WBC: 8.8 10*3/uL (ref 4.0–10.5)

## 2019-12-17 LAB — COMPREHENSIVE METABOLIC PANEL
ALT: 14 U/L (ref 0–35)
AST: 17 U/L (ref 0–37)
Albumin: 4.4 g/dL (ref 3.5–5.2)
Alkaline Phosphatase: 74 U/L (ref 39–117)
BUN: 20 mg/dL (ref 6–23)
CO2: 31 mEq/L (ref 19–32)
Calcium: 9.4 mg/dL (ref 8.4–10.5)
Chloride: 103 mEq/L (ref 96–112)
Creatinine, Ser: 0.81 mg/dL (ref 0.40–1.20)
GFR: 68.7 mL/min (ref >60.00)
Glucose, Bld: 117 mg/dL — ABNORMAL HIGH (ref 70–99)
Potassium: 4 mEq/L (ref 3.5–5.1)
Sodium: 140 mEq/L (ref 135–145)
Total Bilirubin: 0.4 mg/dL (ref 0.2–1.2)
Total Protein: 7.2 g/dL (ref 6.0–8.3)

## 2019-12-17 LAB — LIPID PANEL
Cholesterol: 196 mg/dL (ref 0–200)
HDL: 69 mg/dL (ref >39.00)
LDL Cholesterol: 101 mg/dL — ABNORMAL HIGH (ref 0–99)
NonHDL: 126.78
Total CHOL/HDL Ratio: 3
Triglycerides: 130 mg/dL (ref 0.0–149.0)
VLDL: 26 mg/dL (ref 0.0–40.0)

## 2019-12-17 LAB — VITAMIN D 25 HYDROXY (VIT D DEFICIENCY, FRACTURES): VITD: 46.78 ng/mL (ref 30.00–100.00)

## 2019-12-17 LAB — VITAMIN B12: Vitamin B-12: 678 pg/mL (ref 211–911)

## 2019-12-17 LAB — TSH: TSH: 1.35 u[IU]/mL (ref 0.35–4.50)

## 2019-12-17 MED ORDER — TRAZODONE HCL 50 MG PO TABS: 25.0000 mg | ORAL_TABLET | Freq: Every evening | ORAL | 5 refills | Status: DC | PRN

## 2019-12-17 NOTE — Assessment & Plan Note (Signed)
New Has not been able to sleep well and is only averaging 3 hours a night-has always been a bad sleeper, but this is much worse She would like something to help her-melatonin in the past has not helped Trial of trazodone 25-50 mg nightly

## 2019-12-17 NOTE — Assessment & Plan Note (Signed)
Chronic Check lipid panel  Continue daily statin Regular exercise and healthy diet encouraged  

## 2019-12-17 NOTE — Assessment & Plan Note (Signed)
chronic Taking daily Check level

## 2019-12-17 NOTE — Assessment & Plan Note (Signed)
Chronic dexa up to date Not currently exercising but will restart when able  Continue vitamin d / calcium

## 2019-12-17 NOTE — Assessment & Plan Note (Signed)
Chronic Improved with PT Still has vertigo 4-5 times a week Will continue home exercises - increase if possible Deferred ENT referral and additional PT at this time, but will let me know if she would like to do this Meclizine as needed, Dramamine as needed

## 2019-12-17 NOTE — Assessment & Plan Note (Signed)
S/p dilation denies GERD, dysphagia

## 2019-12-17 NOTE — Assessment & Plan Note (Signed)
Chronic Check a1c Low sugar / carb diet Stressed regular exercise  

## 2019-12-19 ENCOUNTER — Encounter: Payer: Self-pay | Admitting: Internal Medicine

## 2019-12-19 DIAGNOSIS — G4709 Other insomnia: Secondary | ICD-10-CM

## 2019-12-28 MED ORDER — BELSOMRA 10 MG PO TABS: 10.0000 mg | ORAL_TABLET | Freq: Every evening | ORAL | 0 refills | Status: DC | PRN

## 2019-12-28 NOTE — Addendum Note (Signed)
Addended by: Binnie Rail on: 12/28/2019 01:11 PM   Modules accepted: Orders

## 2020-01-04 ENCOUNTER — Encounter: Payer: Self-pay | Admitting: Internal Medicine

## 2020-01-04 DIAGNOSIS — R42 Dizziness and giddiness: Secondary | ICD-10-CM

## 2020-01-05 MED ORDER — BELSOMRA 10 MG PO TABS: 20.0000 mg | ORAL_TABLET | Freq: Every evening | ORAL | 0 refills | Status: DC | PRN

## 2020-01-06 DIAGNOSIS — L720 Epidermal cyst: Secondary | ICD-10-CM | POA: Diagnosis not present

## 2020-01-06 DIAGNOSIS — D225 Melanocytic nevi of trunk: Secondary | ICD-10-CM | POA: Diagnosis not present

## 2020-01-06 DIAGNOSIS — C4441 Basal cell carcinoma of skin of scalp and neck: Secondary | ICD-10-CM | POA: Diagnosis not present

## 2020-01-06 DIAGNOSIS — L820 Inflamed seborrheic keratosis: Secondary | ICD-10-CM | POA: Diagnosis not present

## 2020-01-06 DIAGNOSIS — L814 Other melanin hyperpigmentation: Secondary | ICD-10-CM | POA: Diagnosis not present

## 2020-01-06 DIAGNOSIS — D485 Neoplasm of uncertain behavior of skin: Secondary | ICD-10-CM | POA: Diagnosis not present

## 2020-01-06 DIAGNOSIS — L119 Acantholytic disorder, unspecified: Secondary | ICD-10-CM | POA: Diagnosis not present

## 2020-01-06 DIAGNOSIS — L570 Actinic keratosis: Secondary | ICD-10-CM | POA: Diagnosis not present

## 2020-01-06 DIAGNOSIS — D1801 Hemangioma of skin and subcutaneous tissue: Secondary | ICD-10-CM | POA: Diagnosis not present

## 2020-01-06 DIAGNOSIS — Z85828 Personal history of other malignant neoplasm of skin: Secondary | ICD-10-CM | POA: Diagnosis not present

## 2020-01-06 DIAGNOSIS — L821 Other seborrheic keratosis: Secondary | ICD-10-CM | POA: Diagnosis not present

## 2020-01-25 ENCOUNTER — Encounter: Payer: Self-pay | Admitting: Internal Medicine

## 2020-01-25 MED ORDER — BELSOMRA 20 MG PO TABS: 20.0000 mg | ORAL_TABLET | Freq: Every evening | ORAL | 0 refills | Status: DC | PRN

## 2020-01-28 ENCOUNTER — Ambulatory Visit: Payer: Medicare HMO | Attending: Internal Medicine | Admitting: Physical Therapy

## 2020-01-28 ENCOUNTER — Other Ambulatory Visit: Payer: Self-pay

## 2020-01-28 ENCOUNTER — Encounter: Payer: Self-pay | Admitting: Physical Therapy

## 2020-01-28 DIAGNOSIS — R262 Difficulty in walking, not elsewhere classified: Secondary | ICD-10-CM | POA: Diagnosis not present

## 2020-01-28 DIAGNOSIS — R2681 Unsteadiness on feet: Secondary | ICD-10-CM | POA: Diagnosis not present

## 2020-01-28 DIAGNOSIS — R42 Dizziness and giddiness: Secondary | ICD-10-CM | POA: Insufficient documentation

## 2020-01-28 NOTE — Therapy (Signed)
[facility] [address] [place], [place], [address] Phone: [phone]   Fax:  [phone]  Physical Therapy Evaluation  Patient Details  Name: Tanya [name] MRN: [id number] Date of Birth: 1945/07/24 Referring Provider (PT): Binnie Rail, MD   Encounter Date: 01/28/2020   PT End of Session - 01/28/20 1508    Visit Number 1    Number of Visits 7    Date for PT Re-Evaluation 03/13/20    Authorization Type [employer]    Progress Note Due on Visit 10    PT Start Time 1405    PT Stop Time 1445    PT Time Calculation (min) 40 min    Activity Tolerance Patient tolerated treatment well    Behavior During Therapy [facility] for tasks assessed/performed           Past Medical History:  Diagnosis Date  . Arthritis   . Bursitis 2014   Left; Dr Maureen Ralphs  . Cancer ([place]) 1986   breast cancer L  . Hyperlipidemia   . Lymphedema 2000   LUE  . perennial allergies   . PONV (postoperative nausea and vomiting)   . Skin cancer    basil cell    Past Surgical History:  Procedure Laterality Date  . ABDOMINAL HYSTERECTOMY  1990   & BSO for fibroids  . ANKLE FRACTURE SURGERY  2006  . BREAST RECONSTRUCTION  1986  . BREAST SURGERY  1986   double mastectonmy fror cancer on L  . COLONOSCOPY  04/14/2006   Tics ; Dr Earlean Shawl  . KNEE ARTHROSCOPY  2001 & 2011   L & R  . TOTAL KNEE ARTHROPLASTY  02/24/2012   Procedure: TOTAL KNEE ARTHROPLASTY;  Surgeon: Gearlean Alf, MD;  Location: [facility];  Service: Orthopedics;  Laterality: Right;  . TOTAL KNEE ARTHROPLASTY Left 02/01/2019   Procedure: TOTAL KNEE ARTHROPLASTY;  Surgeon: Gaynelle Arabian, MD;  Location: [facility];  Service: Orthopedics;  Laterality: Left;  72min  . WISDOM TOOTH EXTRACTION      There were no vitals filed for this visit.    Subjective Assessment - 01/28/20 1412    Subjective "I'm not as bad as I was before."  Feeling the dizziness intermittently during the day;  sometimes in the morning, sometimes at work and has to be near a wall so she doesn't fall.  Each episode lasts about 30 minutes.  Doesn't bother her when driving.    Pertinent History OA, bursitis, L breast CA, HLD, LUE lymphedema, skin cancer, ankle fracture, R and L TKA    Diagnostic tests no new imaging since 2020    Currently in Pain? No/denies              [facility] PT Assessment - 01/28/20 1415      Assessment   Medical Diagnosis Vertigo    Referring Provider (PT) Binnie Rail, MD    Onset Date/Surgical Date 01/05/20    Prior Therapy yes for vertigo      Precautions   Precautions Other (comment)    Precaution Comments  OA, bursitis, L breast CA, HLD, LUE lymphedema, skin cancer, ankle fracture, R and L TKA      Balance Screen   Has the patient fallen in the past 6 months Yes    How many times? [place] residence    Living Arrangements Spouse/significant other    Type of [place]  Home Access Stairs to enter    Entrance Stairs-Number of Steps 5    Entrance Stairs-Rails Right;Left      Prior Function   Level of Independence Independent    Vocation Full time employment    Vocation Requirements works in an office, Optician, dispensing, computer work (looking back and forth), Consulting civil engineer    Leisure gardening - dizziness with bending down to pull weeds      Observation/Other Assessments   Focus on Therapeutic Outcomes (FOTO)  Not captured by staff on first visit      Sensation   Light Touch Appears Intact      ROM / Strength   AROM / PROM / Strength Strength      Strength   Overall Strength Within functional limits for tasks performed                  Vestibular Assessment - 01/28/20 1417      Symptom Behavior   Subjective history of current problem No recent changes in vision or hearing but needs cataract surgery.  Nausea is better with the motion sickness band.  A couple of headaches but  isn't sure if it is related to the dizziness.    Type of Dizziness  Imbalance;"Funny feeling in head"   Foggy feeling   Frequency of Dizziness intermittent    Duration of Dizziness 30 minutes    Symptom Nature Spontaneous    Aggravating Factors Looking up to the ceiling;Forward bending;Rolling to right    Relieving Factors Medication;Rest    Progression of Symptoms No change since onset      Oculomotor Exam   Oculomotor Alignment Normal    Ocular ROM WFL    Spontaneous Absent    Gaze-induced  Absent    Smooth Pursuits Intact   dizziness   Saccades Intact;Slow   dizziness with vertical saccades     Oculomotor Exam-Fixation Suppressed    Left Head Impulse positive for small refixation saccade    Right Head Impulse positive for small refixation saccades      Vestibulo-Ocular Reflex   VOR to Slow Head Movement Normal   dizziness   VOR Cancellation Normal   dizziness     Positional Testing   Dix-Hallpike Dix-Hallpike Right;Dix-Hallpike Left    Sidelying Test --    Horizontal Canal Testing Horizontal Canal Right;Horizontal Canal Left      Dix-Hallpike Right   Dix-Hallpike Right Duration 0    Dix-Hallpike Right Symptoms No nystagmus      Dix-Hallpike Left   Dix-Hallpike Left Duration 0    Dix-Hallpike Left Symptoms No nystagmus      Horizontal Canal Right   Horizontal Canal Right Duration 30 seconds     Horizontal Canal Right Symptoms Other (comment)   lightheadedness     Horizontal Canal Left   Horizontal Canal Left Duration 30 seconds    Horizontal Canal Left Symptoms Other (comment)   lightheadedness     Positional Sensitivities   Sit to Supine Lightheadedness    Supine to Left Side Lightheadedness    Supine to Right Side Lightheadedness    Supine to Sitting Lightheadedness    Right Hallpike No dizziness    Up from Right Hallpike Lightheadedness    Up from Left Hallpike Lightheadedness    Nose to Right Knee No dizziness    Right Knee to Sitting Lightedness    Nose  to Left Knee No dizziness    Left Knee to Sitting Lightheadedness  Head Turning x 5 Mild dizziness    Head Nodding x 5 Mild dizziness    Pivot Right in Standing No dizziness    Pivot Left in Standing Lightheadedness    Rolling Right Lightheadedness    Rolling Left Lightheadedness              Objective measurements completed on examination: See above findings.               PT Education - 01/28/20 1507    Education Details PT findings - no indication of reoccurence of BPPV; continued motion sensitivity; PT POC and goals    Person(s) Educated Patient    Methods Explanation    Comprehension Verbalized understanding            PT Short Term Goals - 01/28/20 1513      PT SHORT TERM GOAL #1   Title Pt will participate in FGA assessment    Time 3    Period Weeks    Status New    Target Date 02/18/20      PT SHORT TERM GOAL #2   Title Pt will re-start vestibular and balance HEP    Time 3    Period Weeks    Status New    Target Date 02/18/20             PT Long Term Goals - 01/28/20 1514      PT LONG TERM GOAL #1   Title Pt will demonstrate independence with balance and vestibular HEP    Time 6    Period Weeks    Status New    Target Date 03/13/20      PT LONG TERM GOAL #2   Title Pt will demonstrate 4 point improvement in FGA to indicate decreased falls risk    Baseline TBD    Time 6    Period Weeks    Status New    Target Date 03/13/20      PT LONG TERM GOAL #3   Title Pt will demonstrate ability to look down and reach down to floor for various items with no report of dizziness in order to return to work duties and gardening    Time 6    Period Weeks    Status New    Target Date 03/13/20      PT LONG TERM GOAL #4   Title Pt will report 0/5 dizziness with all movements on MSQ    Baseline 1-2 overall except in hallpike positions    Time 6    Period Weeks    Status New    Target Date 03/13/20                  Plan -  01/28/20 1509    Clinical Impression Statement Pt is a 74 y.o. female well known to this therapist, referred back outpatient neurorehab for return of dizziness.  Pt's PMH is significant for the following:  OA, bursitis, L breast CA, HLD, LUE lymphedema, skin cancer, ankle fracture, R and L TKA.  PT evaluation revealed the following impairments and functional limitations: disequilibrium, impaired VOR, visual motion sensitivity and impaired balance with difficulty walking.  Pt was negative for vertigo or nystagmus with all positional testing.  Pt will benefit from skilled PT services to address these impairments to maximize functional mobility independence and decrease falls risk.    Personal Factors and Comorbidities Comorbidity 3+;Fitness;Past/Current Experience;Profession    Comorbidities OA, bursitis, L breast CA, HLD, LUE  lymphedema, skin cancer, ankle fracture, R and L TKA    Examination-Activity Limitations Bed Mobility;Bend;Locomotion Level;Reach Overhead;Stairs    Examination-Participation Restrictions Cleaning;Yard Work    Stability/Clinical Decision Making Stable/Uncomplicated    Designer, jewellery Low    Rehab Potential Good    PT Frequency 1x / week    PT Duration 6 weeks    PT Treatment/Interventions ADLs/Self Care Home Management;Canalith Repostioning;Gait training;Stair training;Functional mobility training;Therapeutic activities;Therapeutic exercise;Balance training;Neuromuscular re-education;Patient/family education;Vestibular    PT Next Visit Plan (LUE lymphedema - no BP in LUE!)  Assess FGA and reset baseline.  Initiate HEP focusing on x1 viewing and habituation based on MSQ.           Patient will benefit from skilled therapeutic intervention in order to improve the following deficits and impairments:  Decreased balance, Dizziness, Difficulty walking  Visit Diagnosis: Dizziness and giddiness  Unsteadiness on feet  Difficulty in walking, not elsewhere  classified     Problem List Patient Active Problem List   Diagnosis Date Noted  . Insomnia 12/17/2019  . Vertigo 02/22/2019  . Pain in left knee 01/25/2019  . Dysphagia 09/15/2018  . Herpes zoster without complication 05/30/5207  . 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/29/2015  . Essential tremor 12/14/2013  . Osteopenia 06/11/2013  . OA (osteoarthritis) of knee 02/24/2012  . Prediabetes 04/06/2009  . NONTOXIC MULTINODULAR GOITER 03/03/2008  . Hyperlipidemia 12/02/2007  . Allergic rhinitis 12/02/2007  . Personal history of malignant neoplasm of breast 12/02/2007  . History of skin cancer 12/02/2007  . Vitamin D deficiency 12/02/2007    Tanya [name], PT, DPT 01/28/20    3:20 PM    [facility] [address] [place], [place], [address] Phone: [phone]   Fax:  [phone]  Name: Tanya [name] MRN: [id number] Date of Birth: Dec 10, 1945

## 2020-02-03 DIAGNOSIS — C4441 Basal cell carcinoma of skin of scalp and neck: Secondary | ICD-10-CM | POA: Diagnosis not present

## 2020-02-03 DIAGNOSIS — L720 Epidermal cyst: Secondary | ICD-10-CM | POA: Diagnosis not present

## 2020-02-03 DIAGNOSIS — L570 Actinic keratosis: Secondary | ICD-10-CM | POA: Diagnosis not present

## 2020-02-03 DIAGNOSIS — Z85828 Personal history of other malignant neoplasm of skin: Secondary | ICD-10-CM | POA: Diagnosis not present

## 2020-02-03 DIAGNOSIS — L718 Other rosacea: Secondary | ICD-10-CM | POA: Diagnosis not present

## 2020-02-04 ENCOUNTER — Other Ambulatory Visit: Payer: Self-pay | Admitting: Internal Medicine

## 2020-02-10 ENCOUNTER — Ambulatory Visit: Payer: Medicare HMO | Admitting: Physical Therapy

## 2020-02-17 ENCOUNTER — Encounter: Payer: Self-pay | Admitting: Physical Therapy

## 2020-02-17 ENCOUNTER — Ambulatory Visit: Payer: Medicare HMO | Attending: Internal Medicine | Admitting: Physical Therapy

## 2020-02-17 ENCOUNTER — Other Ambulatory Visit: Payer: Self-pay

## 2020-02-17 DIAGNOSIS — R2681 Unsteadiness on feet: Secondary | ICD-10-CM | POA: Insufficient documentation

## 2020-02-17 DIAGNOSIS — R262 Difficulty in walking, not elsewhere classified: Secondary | ICD-10-CM | POA: Insufficient documentation

## 2020-02-17 DIAGNOSIS — R42 Dizziness and giddiness: Secondary | ICD-10-CM | POA: Diagnosis not present

## 2020-02-17 NOTE — Patient Instructions (Signed)
Walking at the Office:  -Walk in the hallway for 2-5 minutes at a time.  Intermittently practice looking to the right or left while you are walking.  Try to walk a little bit faster than your normal speed unless you begin to feel very dizzy or nauseous.   Gaze Stabilization - Tip Card  1.Target must remain in focus, not blurry, and appear stationary while head is in motion. 2.Perform exercises with small head movements (45 to either side of midline). 3.Increase speed of head motion so long as target is in focus. 4.If you wear eyeglasses, be sure you can see target through lens (therapist will give specific instructions for bifocal / progressive lenses). 5.These exercises may provoke dizziness or nausea. Work through these symptoms. If too dizzy, slow head movement slightly. Rest between each exercise. 6.Exercises demand concentration; avoid distractions. 7.For safety, perform standing exercises close to a counter, wall, corner, or next to someone.  Copyright  VHI. All rights reserved.   Gaze Stabilization - Standing Feet Apart   Feet shoulder width apart, keeping eyes on target on wall 3 feet away, tilt head down slightly and move head side to side for 30 seconds. Repeat while moving head up and down for 30 seconds. Do 2-3 sessions per day.

## 2020-02-17 NOTE — Therapy (Signed)
[facility] [address] [place], [place], [address] Phone: [phone]   Fax:  [phone]  Physical Therapy Treatment  Patient Details  Name: Tanya [name] MRN: [id number] Date of Birth: 04-26-1946 Referring Provider (PT): Binnie Rail, MD   Encounter Date: 02/17/2020   PT End of Session - 02/17/20 0848    Visit Number 2    Number of Visits 7    Date for PT Re-Evaluation 03/13/20    Authorization Type [employer]    Progress Note Due on Visit 10    PT Start Time 0808    PT Stop Time 5638    PT Time Calculation (min) 39 min    Activity Tolerance Other (comment)   dizziness   Behavior During Therapy [facility] for tasks assessed/performed           Past Medical History:  Diagnosis Date   Arthritis    Bursitis 2014   Left; Dr Maureen Ralphs   Cancer [facility]) 1986   breast cancer L   Hyperlipidemia    Lymphedema 2000   LUE   perennial allergies    PONV (postoperative nausea and vomiting)    Skin cancer    basil cell    Past Surgical History:  Procedure Laterality Date   [place] for fibroids   ANKLE FRACTURE SURGERY  2006   [place]   double mastectonmy fror cancer on L   COLONOSCOPY  04/14/2006   Tics ; Dr Earlean Shawl   KNEE ARTHROSCOPY  2001 & 2011   L & R   TOTAL KNEE ARTHROPLASTY  02/24/2012   Procedure: TOTAL KNEE ARTHROPLASTY;  Surgeon: Gearlean Alf, MD;  Location: [facility];  Service: Orthopedics;  Laterality: Right;   TOTAL KNEE ARTHROPLASTY Left 02/01/2019   Procedure: TOTAL KNEE ARTHROPLASTY;  Surgeon: Gaynelle Arabian, MD;  Location: [facility];  Service: Orthopedics;  Laterality: Left;  33min   WISDOM TOOTH EXTRACTION      There were no vitals filed for this visit.   Subjective Assessment - 02/17/20 0811    Subjective Everything is about the same; follow up appointments were cancelled due to therapist out.  Had one fall  outside when foot got caught in a fence; was probably dizzy from looking down.    Pertinent History OA, bursitis, L breast CA, HLD, LUE lymphedema, skin cancer, ankle fracture, R and L TKA    Diagnostic tests no new imaging since 2020    Currently in Pain? No/denies              [facility] PT Assessment - 02/17/20 0814      Functional Gait  Assessment   Gait assessed  Yes    Gait Level Surface Walks 20 ft in less than 7 sec but greater than 5.5 sec, uses assistive device, slower speed, mild gait deviations, or deviates 6-10 in outside of the 12 in walkway width.    Change in Gait Speed Able to smoothly change walking speed without loss of balance or gait deviation. Deviate no more than 6 in outside of the 12 in walkway width.    Gait with Horizontal Head Turns Performs head turns smoothly with slight change in gait velocity (eg, minor disruption to smooth gait path), deviates 6-10 in outside 12 in walkway width, or uses an assistive device.    Gait with Vertical Head Turns Performs task with slight change in  gait velocity (eg, minor disruption to smooth gait path), deviates 6 - 10 in outside 12 in walkway width or uses assistive device    Gait and Pivot Turn Pivot turns safely in greater than 3 sec and stops with no loss of balance, or pivot turns safely within 3 sec and stops with mild imbalance, requires small steps to catch balance.    Step Over Obstacle Is able to step over one shoe box (4.5 in total height) but must slow down and adjust steps to clear box safely. May require verbal cueing.    Gait with Narrow Base of Support Ambulates less than 4 steps heel to toe or cannot perform without assistance.    Gait with Eyes Closed Walks 20 ft, uses assistive device, slower speed, mild gait deviations, deviates 6-10 in outside 12 in walkway width. Ambulates 20 ft in less than 9 sec but greater than 7 sec.    Ambulating Backwards Walks 20 ft, uses assistive device, slower speed, mild gait deviations,  deviates 6-10 in outside 12 in walkway width.    Steps Alternating feet, must use rail.    Total Score 18    FGA comment: 18/30 increased falls risk.  3/10 dizziness at baseline; some dizziness with head turns/nods, tandem gait, walking backwards               Vestibular Assessment - 02/17/20 0824      Positional Testing   Dix-Hallpike Dix-Hallpike Right;Dix-Hallpike Left      Dix-Hallpike Right   Dix-Hallpike Right Duration 0    Dix-Hallpike Right Symptoms Other (comment)   dizziness/nausea when returning to sitting     Dix-Hallpike Left   Dix-Hallpike Left Duration did not perform due to nausea                     Vestibular Treatment/Exercise - 02/17/20 0826      Vestibular Treatment/Exercise   Vestibular Treatment Provided Gaze    Gaze Exercises X1 Viewing Horizontal;X1 Viewing Vertical      X1 Viewing Horizontal   Foot Position standing feet apart, solid surface    Comments only able to perform 30 seconds due to symptoms      X1 Viewing Vertical   Foot Position standing feet apart, solid surface    Comments 30 seconds              Balance Exercises - 02/17/20 0847      Balance Exercises: Standing   Gait with Head Turns Forward;4 reps   to simulate walking in hall at work            PT Education - 02/17/20 0847    Education Details FGA, revised HEP    Person(s) Educated Patient    Methods Explanation;Demonstration;Handout    Comprehension Verbalized understanding;Returned demonstration            PT Short Term Goals - 02/17/20 0848      PT SHORT TERM GOAL #1   Title Pt will participate in FGA assessment    Baseline 18/30    Time 3    Period Weeks    Status Achieved    Target Date 02/18/20      PT SHORT TERM GOAL #2   Title Pt will re-start vestibular and balance HEP    Time 3    Period Weeks    Status Achieved    Target Date 02/18/20             PT  Long Term Goals - 01/28/20 1514      PT LONG TERM GOAL #1    Title Pt will demonstrate independence with balance and vestibular HEP    Time 6    Period Weeks    Status New    Target Date 03/13/20      PT LONG TERM GOAL #2   Title Pt will demonstrate 4 point improvement in FGA to indicate decreased falls risk    Baseline TBD    Time 6    Period Weeks    Status New    Target Date 03/13/20      PT LONG TERM GOAL #3   Title Pt will demonstrate ability to look down and reach down to floor for various items with no report of dizziness in order to return to work duties and gardening    Time 6    Period Weeks    Status New    Target Date 03/13/20      PT LONG TERM GOAL #4   Title Pt will report 0/5 dizziness with all movements on MSQ    Baseline 1-2 overall except in hallpike positions    Time 6    Period Weeks    Status New    Target Date 03/13/20                 Plan - 02/17/20 1133    Clinical Impression Statement Pt has not been seen since eval due to clinic scheduling; pt's symptoms remain the same and pt appears to be guarding head movement more today than on eval.  Assessed falls risk during gait with FGA with pt demonstrating increased falls risk.  Re-initiated vestibular HEP but had to downgrade x1 viewing from how pt was performing before.  Also added 2 minute walking program at work adding in intermittent head turns.  Pt required multiple rest breaks due to symptoms and nausea.  Visits added and will continue to progress towards LTG.    Personal Factors and Comorbidities Comorbidity 3+;Fitness;Past/Current Experience;Profession    Comorbidities OA, bursitis, L breast CA, HLD, LUE lymphedema, skin cancer, ankle fracture, R and L TKA    Examination-Activity Limitations Bed Mobility;Bend;Locomotion Level;Reach Overhead;Stairs    Examination-Participation Restrictions Cleaning;Yard Work    Stability/Clinical Decision Making Stable/Uncomplicated    Rehab Potential Good    PT Frequency 1x / week    PT Duration 6 weeks    PT  Treatment/Interventions ADLs/Self Care Home Management;Canalith Repostioning;Gait training;Stair training;Functional mobility training;Therapeutic activities;Therapeutic exercise;Balance training;Neuromuscular re-education;Patient/family education;Vestibular    PT Next Visit Plan (LUE lymphedema - no BP in LUE!)  Recheck L hallpike if able.  How is HEP and walking at work?  Progress HEP, add corner balance, gait with more head turns and nods    Consulted and Agree with Plan of Care Patient           Patient will benefit from skilled therapeutic intervention in order to improve the following deficits and impairments:  Decreased balance, Dizziness, Difficulty walking  Visit Diagnosis: Dizziness and giddiness  Unsteadiness on feet  Difficulty in walking, not elsewhere classified     Problem List Patient Active Problem List   Diagnosis Date Noted   Insomnia 12/17/2019   Vertigo 02/22/2019   Pain in left knee 01/25/2019   Dysphagia 09/15/2018   Herpes zoster without complication 95/03/3266   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 [place], [place], [address] Phone: [phone]   Fax:  [phone]  Name: Tanya [name] MRN: [id number] Date of Birth: [birth year]

## 2020-02-22 ENCOUNTER — Encounter: Payer: Medicare HMO | Admitting: Physical Therapy

## 2020-02-25 ENCOUNTER — Other Ambulatory Visit: Payer: Self-pay

## 2020-02-25 ENCOUNTER — Ambulatory Visit: Payer: Medicare HMO | Admitting: Physical Therapy

## 2020-02-25 ENCOUNTER — Encounter: Payer: Self-pay | Admitting: Physical Therapy

## 2020-02-25 DIAGNOSIS — R262 Difficulty in walking, not elsewhere classified: Secondary | ICD-10-CM | POA: Diagnosis not present

## 2020-02-25 DIAGNOSIS — R2681 Unsteadiness on feet: Secondary | ICD-10-CM | POA: Diagnosis not present

## 2020-02-25 DIAGNOSIS — R42 Dizziness and giddiness: Secondary | ICD-10-CM | POA: Diagnosis not present

## 2020-02-25 NOTE — Therapy (Signed)
[facility] [address] [place], [place], [address] Phone: [phone]   Fax:  [phone]  Physical Therapy Treatment  Patient Details  Name: Tanya [name] MRN: [id number] Date of Birth: May 06, 1946 Referring Provider (PT): Binnie Rail, MD   Encounter Date: 02/25/2020   PT End of Session - 02/25/20 0930    Visit Number 3    Number of Visits 7    Date for PT Re-Evaluation 03/13/20    Authorization Type [employer]    Progress Note Due on Visit 10    PT Start Time [id number]    PT Stop Time 0930    PT Time Calculation (min) 38 min    Behavior During Therapy [facility] for tasks assessed/performed           Past Medical History:  Diagnosis Date   Arthritis    Bursitis 2014   Left; Dr Maureen Ralphs   Cancer [facility]) 1986   breast cancer L   Hyperlipidemia    Lymphedema 2000   LUE   perennial allergies    PONV (postoperative nausea and vomiting)    Skin cancer    basil cell    Past Surgical History:  Procedure Laterality Date   [place] for fibroids   ANKLE FRACTURE SURGERY  2006   [place]   double mastectonmy fror cancer on L   COLONOSCOPY  04/14/2006   Tics ; Dr Earlean Shawl   KNEE ARTHROSCOPY  2001 & 2011   L & R   TOTAL KNEE ARTHROPLASTY  02/24/2012   Procedure: TOTAL KNEE ARTHROPLASTY;  Surgeon: Gearlean Alf, MD;  Location: [facility];  Service: Orthopedics;  Laterality: Right;   TOTAL KNEE ARTHROPLASTY Left 02/01/2019   Procedure: TOTAL KNEE ARTHROPLASTY;  Surgeon: Gaynelle Arabian, MD;  Location: [facility];  Service: Orthopedics;  Laterality: Left;  2min   WISDOM TOOTH EXTRACTION      There were no vitals filed for this visit.   Subjective Assessment - 02/25/20 0855    Subjective Feeling better than last time; worse in the morning.  Staying with her mom and has not been able to get much sleep.    Pertinent History OA, bursitis,  L breast CA, HLD, LUE lymphedema, skin cancer, ankle fracture, R and L TKA    Diagnostic tests no new imaging since 2020    Currently in Pain? No/denies                   Vestibular Assessment - 02/25/20 0858      Positional Testing   Dix-Hallpike Dix-Hallpike Right;Dix-Hallpike Left    Horizontal Canal Testing Horizontal Canal Right;Horizontal Canal Left      Dix-Hallpike Right   Dix-Hallpike Right Duration 0    Dix-Hallpike Right Symptoms No nystagmus      Dix-Hallpike Left   Dix-Hallpike Left Duration 0    Dix-Hallpike Left Symptoms No nystagmus      Horizontal Canal Right   Horizontal Canal Right Duration 0    Horizontal Canal Right Symptoms Normal      Horizontal Canal Left   Horizontal Canal Left Duration 0    Horizontal Canal Left Symptoms Normal      Positional Sensitivities   Sit to Supine No dizziness    Supine to Left Side No dizziness    Supine to Right Side No dizziness    Supine to Sitting Lightheadedness  Right Hallpike No dizziness    Up from Right Hallpike No dizziness    Up from Left Hallpike No dizziness    Nose to Right Knee No dizziness    Right Knee to Sitting Lightedness    Nose to Left Knee No dizziness    Left Knee to Sitting No dizziness    Head Turning x 5 Lightheadedness    Head Nodding x 5 Mild dizziness    Pivot Right in Standing No dizziness    Pivot Left in Standing No dizziness    Rolling Right No dizziness    Rolling Left No dizziness            Walking at the Office:  -Walk in the hallway for 2-5 minutes at a time.  Intermittently practice looking to the right or left while you are walking.  Try to walk a little bit faster than your normal speed unless you begin to feel very dizzy or nauseous.   Gaze Stabilization - Tip Card  1.Target must remain in focus, not blurry, and appear stationary while head is in motion. 2.Perform exercises with small head movements (45 to either side of midline). 3.Increase speed of  head motion so long as target is in focus. 4.If you wear eyeglasses, be sure you can see target through lens (therapist will give specific instructions for bifocal / progressive lenses). 5.These exercises may provoke dizziness or nausea. Work through these symptoms. If too dizzy, slow head movement slightly. Rest between each exercise. 6.Exercises demand concentration; avoid distractions. 7.For safety, perform standing exercises close to a counter, wall, corner, or next to someone.  Copyright  VHI. All rights reserved.   Gaze Stabilization - Standing Feet Apart   Feet shoulder width apart, keeping eyes on target on wall 3 feet away, tilt head down slightly and move head side to side for 30 seconds. Repeat while moving head up and down for 30 seconds. Do 2-3 sessions per day.   Bending / Picking Up Objects    Place a cup/can/etc down on a foot stool in front of you.  Sitting, look down at the object first and then slowly bend down and pick up object on the floor keeping your eyes focused on the object. Return to upright position. Repeat 5 times forwards.  Then move the foot stool and the object to the right, repeat 5 times.  Move foot stool and object to the left, repeat 5 times. Do __1__ sessions per day.  You can use this technique with gardening and putting dishes or groceries away.   Head Motion: Up and Down    Sitting, slowly move head up with eyes open. Hold position until symptoms subside. Then, move head in opposite direction. Hold position until symptoms subside. Repeat __5__ times per session. Do __2__ sessions per day.           Vestibular Treatment/Exercise - 02/25/20 1222      Vestibular Treatment/Exercise   Vestibular Treatment Provided Gaze;Habituation    Habituation Exercises Seated Vertical Head Turns    Gaze Exercises X1 Viewing Horizontal;X1 Viewing Vertical      Seated Vertical Head Turns   Number of Reps  5    Symptom Description  performed vertical  head movements while reaching down to floor for object on foot stool and returning to upright x 5 reps using spotting; also performed seated vertical head movements up and down wiht pause to focus on stable target - both added to HEP  X1 Viewing Horizontal   Foot Position standing feet apart, solid surface    Reps 1    Comments 30 seconds, still experiencing mild symptoms      X1 Viewing Vertical   Foot Position standing feet apart, solid surface    Reps 1    Comments 30 seconds, mild symptoms                 PT Education - 02/25/20 0929    Education Details Kneeling bench for gardening and spotting when bending down to floor to minimize symptoms; progress, additions to HEP    Person(s) Educated Patient    Methods Explanation;Demonstration;Handout    Comprehension Verbalized understanding;Returned demonstration            PT Short Term Goals - 02/17/20 0848      PT SHORT TERM GOAL #1   Title Pt will participate in FGA assessment    Baseline 18/30    Time 3    Period Weeks    Status Achieved    Target Date 02/18/20      PT SHORT TERM GOAL #2   Title Pt will re-start vestibular and balance HEP    Time 3    Period Weeks    Status Achieved    Target Date 02/18/20             PT Long Term Goals - 01/28/20 1514      PT LONG TERM GOAL #1   Title Pt will demonstrate independence with balance and vestibular HEP    Time 6    Period Weeks    Status New    Target Date 03/13/20      PT LONG TERM GOAL #2   Title Pt will demonstrate 4 point improvement in FGA to indicate decreased falls risk    Baseline TBD    Time 6    Period Weeks    Status New    Target Date 03/13/20      PT LONG TERM GOAL #3   Title Pt will demonstrate ability to look down and reach down to floor for various items with no report of dizziness in order to return to work duties and gardening    Time 6    Period Weeks    Status New    Target Date 03/13/20      PT LONG TERM GOAL #4    Title Pt will report 0/5 dizziness with all movements on MSQ    Baseline 1-2 overall except in hallpike positions    Time 6    Period Weeks    Status New    Target Date 03/13/20                 Plan - 02/25/20 1220    Clinical Impression Statement Pt demonstrating progress today; able to participate in full positional testing and re-assessment of MSQ today with only mild symptoms and no nausea.  Added further habituation to patient's HEP based on MSQ; no change to x1 viewing today.  Will continue to progress towards LTG.    Personal Factors and Comorbidities Comorbidity 3+;Fitness;Past/Current Experience;Profession    Comorbidities OA, bursitis, L breast CA, HLD, LUE lymphedema, skin cancer, ankle fracture, R and L TKA    Examination-Activity Limitations Bed Mobility;Bend;Locomotion Level;Reach Overhead;Stairs    Examination-Participation Restrictions Cleaning;Yard Work    Stability/Clinical Decision Making Stable/Uncomplicated    Rehab Potential Good    PT Frequency 1x / week    PT Duration 6  weeks    PT Treatment/Interventions ADLs/Self Care Home Management;Canalith Repostioning;Gait training;Stair training;Functional mobility training;Therapeutic activities;Therapeutic exercise;Balance training;Neuromuscular re-education;Patient/family education;Vestibular    PT Next Visit Plan (LUE lymphedema - no BP in LUE!)  Progress HEP - x1, bending down to floor, add corner balance with head movements, gait with more head turns and nods    Consulted and Agree with Plan of Care Patient           Patient will benefit from skilled therapeutic intervention in order to improve the following deficits and impairments:  Decreased balance, Dizziness, Difficulty walking  Visit Diagnosis: Dizziness and giddiness  Unsteadiness on feet  Difficulty in walking, not elsewhere classified     Problem List Patient Active Problem List   Diagnosis Date Noted   Insomnia 12/17/2019   Vertigo  02/22/2019   Pain in left knee 01/25/2019   Dysphagia 09/15/2018   Herpes zoster without complication 65/99/3570   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 [place], [place], [address] Phone: [phone]   Fax:  [phone]  Name: Tanya [name] MRN: [id number] Date of Birth: 01-24-1946

## 2020-02-25 NOTE — Patient Instructions (Signed)
Walking at the Office:  -Walk in the hallway for 2-5 minutes at a time.  Intermittently practice looking to the right or left while you are walking.  Try to walk a little bit faster than your normal speed unless you begin to feel very dizzy or nauseous.   Gaze Stabilization - Tip Card  1.Target must remain in focus, not blurry, and appear stationary while head is in motion. 2.Perform exercises with small head movements (45 to either side of midline). 3.Increase speed of head motion so long as target is in focus. 4.If you wear eyeglasses, be sure you can see target through lens (therapist will give specific instructions for bifocal / progressive lenses). 5.These exercises may provoke dizziness or nausea. Work through these symptoms. If too dizzy, slow head movement slightly. Rest between each exercise. 6.Exercises demand concentration; avoid distractions. 7.For safety, perform standing exercises close to a counter, wall, corner, or next to someone.  Copyright  VHI. All rights reserved.   Gaze Stabilization - Standing Feet Apart   Feet shoulder width apart, keeping eyes on target on wall 3 feet away, tilt head down slightly and move head side to side for 30 seconds. Repeat while moving head up and down for 30 seconds. Do 2-3 sessions per day.   Bending / Picking Up Objects    Place a cup/can/etc down on a foot stool in front of you.  Sitting, look down at the object first and then slowly bend down and pick up object on the floor keeping your eyes focused on the object. Return to upright position. Repeat 5 times forwards.  Then move the foot stool and the object to the right, repeat 5 times.  Move foot stool and object to the left, repeat 5 times. Do __1__ sessions per day.  You can use this technique with gardening and putting dishes or groceries away.   Head Motion: Up and Down    Sitting, slowly move head up with eyes open. Hold position until symptoms subside. Then, move head  in opposite direction. Hold position until symptoms subside. Repeat __5__ times per session. Do __2__ sessions per day.

## 2020-02-27 ENCOUNTER — Other Ambulatory Visit: Payer: Self-pay | Admitting: Internal Medicine

## 2020-03-01 ENCOUNTER — Other Ambulatory Visit: Payer: Self-pay

## 2020-03-01 ENCOUNTER — Ambulatory Visit: Payer: Medicare HMO | Admitting: Podiatry

## 2020-03-01 DIAGNOSIS — B351 Tinea unguium: Secondary | ICD-10-CM

## 2020-03-01 NOTE — Progress Notes (Signed)
   HPI: 74 y.o. female presenting today for follow-up evaluation regarding onychomycosis to the right hallux nail plate.  Patient has gone through 6 treatments of laser antifungal treatment with our nurse here in the office.  She presents today for follow-up treatment and evaluation  Past Medical History:  Diagnosis Date  . Arthritis   . Bursitis 2014   Left; Dr Maureen Ralphs  . Cancer ([place]) 1986   breast cancer L  . Hyperlipidemia   . Lymphedema 2000   LUE  . perennial allergies   . PONV (postoperative nausea and vomiting)   . Skin cancer    basil cell     Physical Exam: General: The patient is alert and oriented x3 in no acute distress.  Dermatology: Skin is warm, dry and supple bilateral lower extremities. Negative for open lesions or macerations.  The distal two thirds of the right hallux nail plate does appear to be discolored with dystrophy noted.  It is also thickened at the distal tip.  However, the base of the nail and approximately one third of the nail plate at the base appears healthy and viable with good appearance and growth of the nail.  I do believe that the antifungal laser treatment did work and now we need to wait for the remaining nail to grow out.  Vascular: Palpable pedal pulses bilaterally. No edema or erythema noted. Capillary refill within normal limits.  Neurological: Epicritic and protective threshold grossly intact bilaterally.   Musculoskeletal Exam: Range of motion within normal limits to all pedal and ankle joints bilateral. Muscle strength 5/5 in all groups bilateral.   Assessment: 1.  Onychomycosis of toenail right hallux    Plan of Care:  1. Patient evaluated.  2.  Recommend that the patient wait and that the right hallux nail plate continue to grow out.  We will simply observe for the moment 3.  Return to clinic in 6 months      Edrick Kins, DPM [facility]  Dr. Edrick Kins, DPM    [address], [place] [address]                Office [phone]  Fax [phone]

## 2020-03-03 ENCOUNTER — Ambulatory Visit: Payer: Medicare HMO | Admitting: Physical Therapy

## 2020-03-03 ENCOUNTER — Encounter: Payer: Self-pay | Admitting: Physical Therapy

## 2020-03-03 ENCOUNTER — Other Ambulatory Visit: Payer: Self-pay

## 2020-03-03 DIAGNOSIS — R42 Dizziness and giddiness: Secondary | ICD-10-CM

## 2020-03-03 DIAGNOSIS — R262 Difficulty in walking, not elsewhere classified: Secondary | ICD-10-CM | POA: Diagnosis not present

## 2020-03-03 DIAGNOSIS — R2681 Unsteadiness on feet: Secondary | ICD-10-CM | POA: Diagnosis not present

## 2020-03-03 NOTE — Patient Instructions (Signed)
Walking at the Office:  -Walk in the hallway for 5-8 minutes at a time, build up slowly.  Intermittently practice looking to the right or left while you are walking.  Try to walk a little bit faster than your normal speed unless you begin to feel very dizzy or nauseous.   Gaze Stabilization: Standing Feet Together    Feet together, keeping eyes on target on wall __3__ feet away, tilt head down 15-30 and move head side to side for __60__ seconds. Repeat while moving head up and down for __60__ seconds. Do _2___ sessions per day.   Reaching / Placing Object in [place]    With feet apart, standing on a soft surface (pillow, folded blanket or cushion) using one hand, reach forward and take an object from your couch/ottoman, rotate and place object to right side at waist level.  5 times to the right, and then back to the middle. Repeat the whole set to the left and then back to the middle.   Feet Together, Head Motion - Eyes Open    With eyes open, feet together, move head slowly: up and down 10 times. Repeat 2 times per session. Do 2 sessions per day.

## 2020-03-03 NOTE — Therapy (Signed)
[facility] [address] [place], [place], [address] Phone: [phone]   Fax:  [phone]  Physical Therapy Treatment  Patient Details  Name: Tanya [name] MRN: [id number] Date of Birth: 1946-01-20 Referring Provider (PT): Binnie Rail, MD   Encounter Date: 03/03/2020   PT End of Session - 03/03/20 1014    Visit Number 4    Number of Visits 7    Date for PT Re-Evaluation 03/13/20    Authorization Type [employer]    Progress Note Due on Visit 10    PT Start Time 0930    PT Stop Time 1013    PT Time Calculation (min) 43 min    Behavior During Therapy [facility] for tasks assessed/performed           Past Medical History:  Diagnosis Date  . Arthritis   . Bursitis 2014   Left; Dr Maureen Ralphs  . Cancer ([place]) 1986   breast cancer L  . Hyperlipidemia   . Lymphedema 2000   LUE  . perennial allergies   . PONV (postoperative nausea and vomiting)   . Skin cancer    basil cell    Past Surgical History:  Procedure Laterality Date  . ABDOMINAL HYSTERECTOMY  1990   & BSO for fibroids  . ANKLE FRACTURE SURGERY  2006  . BREAST RECONSTRUCTION  1986  . BREAST SURGERY  1986   double mastectonmy fror cancer on L  . COLONOSCOPY  04/14/2006   Tics ; Dr Earlean Shawl  . KNEE ARTHROSCOPY  2001 & 2011   L & R  . TOTAL KNEE ARTHROPLASTY  02/24/2012   Procedure: TOTAL KNEE ARTHROPLASTY;  Surgeon: Gearlean Alf, MD;  Location: [facility];  Service: Orthopedics;  Laterality: Right;  . TOTAL KNEE ARTHROPLASTY Left 02/01/2019   Procedure: TOTAL KNEE ARTHROPLASTY;  Surgeon: Gaynelle Arabian, MD;  Location: [facility];  Service: Orthopedics;  Laterality: Left;  17min  . WISDOM TOOTH EXTRACTION      There were no vitals filed for this visit.   Subjective Assessment - 03/03/20 0936    Subjective Has had a good week.  As long as she does her exercises she feels good.    Pertinent History OA, bursitis, L breast CA, HLD, LUE lymphedema, skin  cancer, ankle fracture, R and L TKA    Diagnostic tests no new imaging since 2020    Currently in Pain? No/denies           Reviewed each of patient's exercises in HEP and progressed all exercises.  Progressed from sitting > standing and standing with more narrow BOS for x1 viewing and when performing head nods for habituation.  Also progressed bending forwards to standing and reaching waist level<>floor.  Also recommended pt continue to progress time with walking program at work.  See progression below:   Walking at the Office:  -Walk in the hallway for 5-8 minutes at a time, build up slowly.  Intermittently practice looking to the right or left while you are walking.  Try to walk a little bit faster than your normal speed unless you begin to feel very dizzy or nauseous.   Gaze Stabilization: Standing Feet Together    Feet together, keeping eyes on target on wall __3__ feet away, tilt head down 15-30 and move head side to side for __60__ seconds. Repeat while moving head up and down for __60__ seconds. Do _2___ sessions per day.   Reaching / Web designer in [place]  With feet apart, standing on a soft surface (pillow, folded blanket or cushion) using one hand, reach forward and take an object from your couch/ottoman, rotate and place object to right side at waist level.  5 times to the right, and then back to the middle. Repeat the whole set to the left and then back to the middle.   Feet Together, Head Motion - Eyes Open    With eyes open, feet together, move head slowly: up and down 10 times. Repeat 2 times per session. Do 2 sessions per day.       PT Education - 03/03/20 1016    Education Details Progressed HEP, plan for next two visits.  D/C likely in two visits due to progress    Person(s) Educated Patient    Methods Explanation;Demonstration;Handout    Comprehension Verbalized understanding;Returned demonstration               PT Short Term Goals -  02/17/20 0848      PT SHORT TERM GOAL #1   Title Pt will participate in FGA assessment    Baseline 18/30    Time 3    Period Weeks    Status Achieved    Target Date 02/18/20      PT SHORT TERM GOAL #2   Title Pt will re-start vestibular and balance HEP    Time 3    Period Weeks    Status Achieved    Target Date 02/18/20             PT Long Term Goals - 01/28/20 1514      PT LONG TERM GOAL #1   Title Pt will demonstrate independence with balance and vestibular HEP    Time 6    Period Weeks    Status New    Target Date 03/13/20      PT LONG TERM GOAL #2   Title Pt will demonstrate 4 point improvement in FGA to indicate decreased falls risk    Baseline TBD    Time 6    Period Weeks    Status New    Target Date 03/13/20      PT LONG TERM GOAL #3   Title Pt will demonstrate ability to look down and reach down to floor for various items with no report of dizziness in order to return to work duties and gardening    Time 6    Period Weeks    Status New    Target Date 03/13/20      PT LONG TERM GOAL #4   Title Pt will report 0/5 dizziness with all movements on MSQ    Baseline 1-2 overall except in hallpike positions    Time 6    Period Weeks    Status New    Target Date 03/13/20                 Plan - 03/03/20 1224    Clinical Impression Statement Pt continues to demonstrate good progress; able to further progress HEP and walking program with minimal symptoms.  Pt anticipates she will be ready for D/C in two visits.  Will continue to progress towards LTG.    Personal Factors and Comorbidities Comorbidity 3+;Fitness;Past/Current Experience;Profession    Comorbidities OA, bursitis, L breast CA, HLD, LUE lymphedema, skin cancer, ankle fracture, R and L TKA    Examination-Activity Limitations Bed Mobility;Bend;Locomotion Level;Reach Overhead;Stairs    Examination-Participation Restrictions Cleaning;Yard Work    Stability/Clinical Decision Making  Stable/Uncomplicated  Rehab Potential Good    PT Frequency 1x / week    PT Duration 6 weeks    PT Treatment/Interventions ADLs/Self Care Home Management;Canalith Repostioning;Gait training;Stair training;Functional mobility training;Therapeutic activities;Therapeutic exercise;Balance training;Neuromuscular re-education;Patient/family education;Vestibular    PT Next Visit Plan (LUE lymphedema - no BP in LUE!)  Continue to progress HEP and prepare for D/C next visit    Consulted and Agree with Plan of Care Patient           Patient will benefit from skilled therapeutic intervention in order to improve the following deficits and impairments:  Decreased balance, Dizziness, Difficulty walking  Visit Diagnosis: Dizziness and giddiness  Unsteadiness on feet  Difficulty in walking, not elsewhere classified     Problem List Patient Active Problem List   Diagnosis Date Noted  . Insomnia 12/17/2019  . Vertigo 02/22/2019  . Pain in left knee 01/25/2019  . Dysphagia 09/15/2018  . Herpes zoster without complication 14/97/0263  . 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/29/2015  . Essential tremor 12/14/2013  . Osteopenia 06/11/2013  . OA (osteoarthritis) of knee 02/24/2012  . Prediabetes 04/06/2009  . NONTOXIC MULTINODULAR GOITER 03/03/2008  . Hyperlipidemia 12/02/2007  . Allergic rhinitis 12/02/2007  . Personal history of malignant neoplasm of breast 12/02/2007  . History of skin cancer 12/02/2007  . Vitamin D deficiency 12/02/2007    Rico Junker, PT, DPT 03/03/20    12:28 PM    [facility] [address] [place], [place], [address] Phone: [phone]   Fax:  [phone]  Name: Tanya [name] MRN: [id number] Date of Birth: [birth year]

## 2020-03-09 ENCOUNTER — Telehealth: Payer: Medicare HMO

## 2020-03-09 ENCOUNTER — Encounter: Payer: Medicare HMO | Admitting: Physical Therapy

## 2020-03-10 ENCOUNTER — Encounter: Payer: Medicare HMO | Admitting: Physical Therapy

## 2020-03-15 ENCOUNTER — Other Ambulatory Visit: Payer: Self-pay

## 2020-03-15 ENCOUNTER — Encounter: Payer: Medicare HMO | Admitting: Physical Therapy

## 2020-03-15 ENCOUNTER — Encounter: Payer: Self-pay | Admitting: Physical Therapy

## 2020-03-15 ENCOUNTER — Ambulatory Visit: Payer: Medicare HMO | Attending: Internal Medicine | Admitting: Physical Therapy

## 2020-03-15 DIAGNOSIS — R2681 Unsteadiness on feet: Secondary | ICD-10-CM | POA: Diagnosis not present

## 2020-03-15 DIAGNOSIS — R262 Difficulty in walking, not elsewhere classified: Secondary | ICD-10-CM | POA: Diagnosis not present

## 2020-03-15 DIAGNOSIS — R42 Dizziness and giddiness: Secondary | ICD-10-CM | POA: Insufficient documentation

## 2020-03-15 NOTE — Therapy (Signed)
[facility] [address] [place], [place], [address] Phone: [phone]   Fax:  [phone]  Physical Therapy Treatment  Patient Details  Name: Tanya [name] MRN: [id number] Date of Birth: 02-Apr-1946 Referring Provider (PT): Binnie Rail, MD   Encounter Date: 03/15/2020   PT End of Session - 03/15/20 1417    Visit Number 5    Number of Visits 7    Date for PT Re-Evaluation 03/13/20    Authorization Type [employer]    Progress Note Due on Visit 10    PT Start Time 1230    PT Stop Time 1318    PT Time Calculation (min) 48 min    Activity Tolerance Other (comment)   dizziness   Behavior During Therapy [facility] for tasks assessed/performed           Past Medical History:  Diagnosis Date  . Arthritis   . Bursitis 2014   Left; Dr Maureen Ralphs  . Cancer ([place]) 1986   breast cancer L  . Hyperlipidemia   . Lymphedema 2000   LUE  . perennial allergies   . PONV (postoperative nausea and vomiting)   . Skin cancer    basil cell    Past Surgical History:  Procedure Laterality Date  . ABDOMINAL HYSTERECTOMY  1990   & BSO for fibroids  . ANKLE FRACTURE SURGERY  2006  . BREAST RECONSTRUCTION  1986  . BREAST SURGERY  1986   double mastectonmy fror cancer on L  . COLONOSCOPY  04/14/2006   Tics ; Dr Earlean Shawl  . KNEE ARTHROSCOPY  2001 & 2011   L & R  . TOTAL KNEE ARTHROPLASTY  02/24/2012   Procedure: TOTAL KNEE ARTHROPLASTY;  Surgeon: Gearlean Alf, MD;  Location: [facility];  Service: Orthopedics;  Laterality: Right;  . TOTAL KNEE ARTHROPLASTY Left 02/01/2019   Procedure: TOTAL KNEE ARTHROPLASTY;  Surgeon: Gaynelle Arabian, MD;  Location: [facility];  Service: Orthopedics;  Laterality: Left;  27mn  . WISDOM TOOTH EXTRACTION      There were no vitals filed for this visit.   Subjective Assessment - 03/15/20 1236    Subjective Last week had to cancel two visits due to feeling bad with dizziness and nauseous.  Feeling a little  better today but not 100%.  Still not able to reach to the ground repeatedly.    Pertinent History OA, bursitis, L breast CA, HLD, LUE lymphedema, skin cancer, ankle fracture, R and L TKA    Diagnostic tests no new imaging since 2020    Currently in Pain? No/denies              [facility] - 03/15/20 1241      Assessment   Medical Diagnosis Vertigo    Referring Provider (PT) BBinnie Rail MD    Onset Date/Surgical Date 01/05/20    Prior Therapy yes for vertigo      Precautions   Precautions Other (comment)    Precaution Comments  OA, bursitis, L breast CA, HLD, LUE lymphedema, skin cancer, ankle fracture, R and L TKA      Observation/Other Assessments   Focus on Therapeutic Outcomes (FOTO)  Not captured by staff on first visit      Functional Gait  Assessment   Gait assessed  Yes    Gait Level Surface Walks 20 ft in less than 7 sec but greater than 5.5 sec, uses assistive device, slower speed, mild gait deviations, or deviates 6-10  in outside of the 12 in walkway width.    Change in Gait Speed Able to smoothly change walking speed without loss of balance or gait deviation. Deviate no more than 6 in outside of the 12 in walkway width.   dizziness with change in gait speed   Gait with Horizontal Head Turns Performs head turns smoothly with slight change in gait velocity (eg, minor disruption to smooth gait path), deviates 6-10 in outside 12 in walkway width, or uses an assistive device.    Gait with Vertical Head Turns Performs head turns with no change in gait. Deviates no more than 6 in outside 12 in walkway width.    Gait and Pivot Turn Turns slowly, requires verbal cueing, or requires several small steps to catch balance following turn and stop    Step Over Obstacle Is able to step over one shoe box (4.5 in total height) without changing gait speed. No evidence of imbalance.    Gait with Narrow Base of Support Ambulates less than 4 steps heel to toe or cannot perform  without assistance.    Gait with Eyes Closed Walks 20 ft, slow speed, abnormal gait pattern, evidence for imbalance, deviates 10-15 in outside 12 in walkway width. Requires more than 9 sec to ambulate 20 ft.    Ambulating Backwards Walks 20 ft, uses assistive device, slower speed, mild gait deviations, deviates 6-10 in outside 12 in walkway width.    Steps Two feet to a stair, must use rail.    Total Score 17    FGA comment: 17/30; 2/10 dizziness at baseline.                 Vestibular Assessment - 03/15/20 1254      Positional Sensitivities   Nose to Right Knee Lightheadedness    Right Knee to Sitting Lightedness    Nose to Left Knee Lightheadedness    Left Knee to Sitting Lightheadedness    Head Turning x 5 Moderate dizziness    Head Nodding x 5 Moderate dizziness    Pivot Right in Standing Moderate dizziness    Pivot Left in Standing Moderate dizziness            Reviewed previous HEP and how to slowly progress at home.  Instructions provided below and discussed plan:  Walking at the Office:  -Walk in the hallway for 2-5 minutes at a time.  Intermittently practice looking to the right or left while you are walking.  Try to walk a little bit faster than your normal speed unless you begin to feel very dizzy or nauseous.   Gaze Stabilization - Tip Card  1.Target must remain in focus, not blurry, and appear stationary while head is in motion. 2.Perform exercises with small head movements (45 to either side of midline). 3.Increase speed of head motion so long as target is in focus. 4.If you wear eyeglasses, be sure you can see target through lens (therapist will give specific instructions for bifocal / progressive lenses). 5.These exercises may provoke dizziness or nausea. Work through these symptoms. If too dizzy, slow head movement slightly. Rest between each exercise. 6.Exercises demand concentration; avoid distractions. 7.For safety, perform standing exercises close  to a counter, wall, corner, or next to someone.  Copyright  [employer]. All rights reserved.   Gaze Stabilization - Standing Feet Apart   Feet shoulder width apart, keeping eyes on target on wall 3 feet away, tilt head down slightly and move head up and down for 30  seconds.  Then sit down and perform head side to side with lower target, 30 seconds.  Ways to progress - increase to 60 seconds; up/down feet together; side to side standing holding the back of a chair; side to side without holding the back of a chair; side to side feet together  2x/day ___________________________________________________________________________________________________________________________________________ Bending / Picking Up Objects    Place a cup/can/etc down on a foot stool in front of you.  Sitting, look down at the object first and then slowly bend down and pick up object on the floor keeping your eyes focused on the object. Return to upright position. Repeat 5 times forwards.  Then move the foot stool and the object to the right, repeat 5 times.  Move foot stool and object to the left, repeat 5 times. Do __1__ sessions per day.  You can use this technique with gardening and putting dishes or groceries away.  Ways to progress: remove foot stool and put items on the ground (you are still sitting); standing at the counter, one hand on the counter move item from counter to top shelf of lower cabinet x 5; one hand on counter moving item from counter to bottom shelf of lower cabinet x 5; hand on the counter moving item from counter to floor x 5.    ___________________________________________________________________________________________________________________________________________  Head Motion: Up and Down    Sitting, slowly move head up and down slowly, 5 times.  Then perform head movements side to side slowly, 5 times. Repeat __5__ times per session. Do __2__ sessions per day.  To progress: increase to  10 repetitions; standing feet apart holding the back of a chair; standing feet apart not holding the back of the chair; standing feet together holding the back of a chair; feet together not holding the back of a chair  Give yourself about 2-3 weeks of practice before trying to progress to next level.        PT Education - 03/15/20 1416    Education Details pt reporting regression of progress and inability to perform current HEP; downgraded HEP and placed pt on hold for 4 weeks - will follow up and if pt is able to self progress at home more slowly will D/C; if pt continues to regress will restart therapy    Person(s) Educated Patient    Methods Explanation;Demonstration;Handout    Comprehension Verbalized understanding;Returned demonstration            PT Short Term Goals - 02/17/20 0848      PT SHORT TERM GOAL #1   Title Pt will participate in FGA assessment    Baseline 18/30    Time 3    Period Weeks    Status Achieved    Target Date 02/18/20      PT SHORT TERM GOAL #2   Title Pt will re-start vestibular and balance HEP    Time 3    Period Weeks    Status Achieved    Target Date 02/18/20             PT Long Term Goals - 03/15/20 1258      PT LONG TERM GOAL #1   Title Pt will demonstrate independence with balance and vestibular HEP    Time 6    Period Weeks    Status On-going      PT LONG TERM GOAL #2   Title Pt will demonstrate 4 point improvement in FGA to indicate decreased falls risk    Baseline 17/30  no change    Time 6    Period Weeks    Status Not Met      PT LONG TERM GOAL #3   Title Pt will demonstrate ability to look down and reach down to floor for various items with no report of dizziness in order to return to work duties and gardening    Time 6    Period Weeks    Status Partially [place] #4   Title Pt will report 0/5 dizziness with all movements on MSQ    Baseline worse today 3/5 for pivoting and head movements    Time 6     Period Weeks    Status Not Met            New goals for recert:   PT Short Term Goals - 03/15/20 1436      PT SHORT TERM GOAL #1   Title = LTG           PT Long Term Goals - 03/15/20 1436      PT LONG TERM GOAL #1   Title Pt will demonstrate independence with balance and vestibular HEP    Time 4    Period Weeks    Status On-going    Target Date 04/14/20      PT LONG TERM GOAL #2   Title Pt will demonstrate 4 point improvement in FGA to indicate decreased falls risk    Baseline 17/30 no change    Time 4    Period Weeks    Status Revised    Target Date 04/14/20      PT LONG TERM GOAL #3   Title Pt will demonstrate ability to look down and reach down to floor for various items with no report of dizziness in order to return to work duties and gardening    Time 4    Period Weeks    Status Revised    Target Date 04/14/20      PT LONG TERM GOAL #4   Title Pt will report 0/5 dizziness with all movements on MSQ    Baseline worse today 3/5 for pivoting and head movements    Time 4    Period Weeks    Status Revised    Target Date 04/14/20               Plan - 03/15/20 1418    Clinical Impression Statement Performed assessment of patient progress towards LTG.  Pt was on target to meet goals and was making good progress but after last session and upgrading of HEP pt experienced significant increase in symptoms and decline in function.  Pt's FGA has not changed, pt demonstrated more severe motion sensitivity today.  Returned most of patient's exercises to sitting and have pt ways to self progress at home very slowly.  Will follow up with pt in 4 weeks to see if she has been able to slowly self progress or has she continued to regress and needs further therapy.  Pt agreeable to plan.    Personal Factors and Comorbidities Comorbidity 3+;Fitness;Past/Current Experience;Profession    Comorbidities OA, bursitis, L breast CA, HLD, LUE lymphedema, skin cancer, ankle  fracture, R and L TKA    Examination-Activity Limitations Bed Mobility;Bend;Locomotion Level;Reach Overhead;Stairs    Examination-Participation Restrictions Cleaning;Yard Work    Stability/Clinical Decision Making Stable/Uncomplicated    Rehab Potential Good    PT Frequency 1x / week  PT Duration 8 weeks    PT Treatment/Interventions ADLs/Self Care Home Management;Canalith Repostioning;Gait training;Stair training;Functional mobility training;Therapeutic activities;Therapeutic exercise;Balance training;Neuromuscular re-education;Patient/family education;Vestibular    PT Next Visit Plan (LUE lymphedema - no BP in LUE!)  Check in - need to start therapy again or able to self progress slowly at home?    Consulted and Agree with Plan of Care Patient           Patient will benefit from skilled therapeutic intervention in order to improve the following deficits and impairments:  Decreased balance, Dizziness, Difficulty walking  Visit Diagnosis: Dizziness and giddiness  Unsteadiness on feet  Difficulty in walking, not elsewhere classified     Problem List Patient Active Problem List   Diagnosis Date Noted  . Insomnia 12/17/2019  . Vertigo 02/22/2019  . Pain in left knee 01/25/2019  . Dysphagia 09/15/2018  . Herpes zoster without complication 11/05/7317  . 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/29/2015  . Essential tremor 12/14/2013  . Osteopenia 06/11/2013  . OA (osteoarthritis) of knee 02/24/2012  . Prediabetes 04/06/2009  . NONTOXIC MULTINODULAR GOITER 03/03/2008  . Hyperlipidemia 12/02/2007  . Allergic rhinitis 12/02/2007  . Personal history of malignant neoplasm of breast 12/02/2007  . History of skin cancer 12/02/2007  . Vitamin D deficiency 12/02/2007    Rico Junker, PT, DPT 03/15/20    2:36 PM    [facility] [address] [place], [place], [address] Phone: [phone]   Fax:  [phone]  Name: Tanya [name] MRN: [id number] Date of Birth: Oct 19, 1945

## 2020-03-15 NOTE — Patient Instructions (Addendum)
Walking at the Office:  -Walk in the hallway for 2-5 minutes at a time.  Intermittently practice looking to the right or left while you are walking.  Try to walk a little bit faster than your normal speed unless you begin to feel very dizzy or nauseous.   Gaze Stabilization - Tip Card  1.Target must remain in focus, not blurry, and appear stationary while head is in motion. 2.Perform exercises with small head movements (45 to either side of midline). 3.Increase speed of head motion so long as target is in focus. 4.If you wear eyeglasses, be sure you can see target through lens (therapist will give specific instructions for bifocal / progressive lenses). 5.These exercises may provoke dizziness or nausea. Work through these symptoms. If too dizzy, slow head movement slightly. Rest between each exercise. 6.Exercises demand concentration; avoid distractions. 7.For safety, perform standing exercises close to a counter, wall, corner, or next to someone.  Copyright  V[employer]. All rights reserved.   Gaze Stabilization - Standing Feet Apart   Feet shoulder width apart, keeping eyes on target on wall 3 feet away, tilt head down slightly and move head up and down for 30 seconds.  Then sit down and perform head side to side with lower target, 30 seconds.  Ways to progress - increase to 60 seconds; up/down feet together; side to side standing holding the back of a chair; side to side without holding the back of a chair; side to side feet together  2x/day ___________________________________________________________________________________________________________________________________________ Bending / Picking Up Objects    Place a cup/can/etc down on a foot stool in front of you.  Sitting, look down at the object first and then slowly bend down and pick up object on the floor keeping your eyes focused on the object. Return to upright position. Repeat 5 times forwards.  Then move the foot stool and  the object to the right, repeat 5 times.  Move foot stool and object to the left, repeat 5 times. Do __1__ sessions per day.  You can use this technique with gardening and putting dishes or groceries away.  Ways to progress: remove foot stool and put items on the ground (you are still sitting); standing at the counter, one hand on the counter move item from counter to top shelf of lower cabinet x 5; one hand on counter moving item from counter to bottom shelf of lower cabinet x 5; hand on the counter moving item from counter to floor x 5.    ___________________________________________________________________________________________________________________________________________  Head Motion: Up and Down    Sitting, slowly move head up and down slowly, 5 times.  Then perform head movements side to side slowly, 5 times. Repeat __5__ times per session. Do __2__ sessions per day.  To progress: increase to 10 repetitions; standing feet apart holding the back of a chair; standing feet apart not holding the back of the chair; standing feet together holding the back of a chair; feet together not holding the back of a chair  Give yourself about 2-3 weeks of practice before trying to progress to next level.

## 2020-03-21 ENCOUNTER — Encounter: Payer: Medicare HMO | Admitting: Physical Therapy

## 2020-03-22 DIAGNOSIS — J300 Vasomotor rhinitis: Secondary | ICD-10-CM | POA: Diagnosis not present

## 2020-03-25 ENCOUNTER — Other Ambulatory Visit: Payer: Self-pay | Admitting: Internal Medicine

## 2020-04-06 ENCOUNTER — Other Ambulatory Visit: Payer: Self-pay | Admitting: Internal Medicine

## 2020-04-13 DIAGNOSIS — H5213 Myopia, bilateral: Secondary | ICD-10-CM | POA: Diagnosis not present

## 2020-04-17 ENCOUNTER — Telehealth: Payer: Self-pay | Admitting: Pharmacist

## 2020-04-17 NOTE — Progress Notes (Signed)
Chronic Care Management Pharmacy Assistant   Name: Tanya [name]  MRN: [id number] DOB: 10-16-1945  Reason for Encounter: General Adherence call   PCP : Binnie Rail, MD  Allergies:   Allergies  Allergen Reactions  . Hydromorphone     whelps on skin   . Methocarbamol     Whelps on skin   . Oxycodone Hives    Blister on back  . Prednisone     Reaction=wired per patient    Medications: Outpatient Encounter Medications as of 04/17/2020  Medication Sig  . azelastine (ASTELIN) 0.1 % nasal spray Place 1 spray into both nostrils 2 (two) times daily. Use in each nostril as directed   . BELSOMRA 20 MG TABS TAKE 20 MG BY MOUTH AT BEDTIME AS NEEDED.  Marland Kitchen Biotin 10 MG TABS Take 1 tablet by mouth daily.  . Calcium Carb-Cholecalciferol (CALCIUM/VITAMIN D) 500-200 MG-UNIT TABS Take 1 tablet by mouth daily.  . cetirizine (ZYRTEC) 10 MG tablet TAKE 1 TABLET (10 MG TOTAL) BY MOUTH DAILY. KEEP SCHEDULED APPT FOR FUTURE REFILLS  . dimenhyDRINATE (DRAMAMINE) 50 MG tablet Take 50 mg by mouth every 8 (eight) hours as needed for dizziness.  Marland Kitchen ELDERBERRY PO Take 1 capsule by mouth daily.  . fluticasone (FLONASE) 50 MCG/ACT nasal spray Place 1 spray into the nose daily.   Marland Kitchen ipratropium (ATROVENT) 0.06 % nasal spray Place 1 spray into both nostrils 2 (two) times a day.   . levocetirizine (XYZAL) 5 MG tablet TAKE 1 TABLET BY MOUTH EVERY DAY IN THE EVENING  . meclizine (ANTIVERT) 12.5 MG tablet Take 1 tablet (12.5 mg total) by mouth 3 (three) times daily as needed for dizziness.  . metroNIDAZOLE (METROCREAM) 0.75 % cream   . Multiple Vitamin (MULTIVITAMIN) tablet Take 1 tablet by mouth daily.  . simvastatin (ZOCOR) 20 MG tablet Take 1 tablet by mouth daily in the evening.  . traZODone (DESYREL) 50 MG tablet    No facility-administered encounter medications on file as of 04/17/2020.    Current Diagnosis: Patient Active Problem List   Diagnosis Date Noted  . Insomnia 12/17/2019  . Vertigo  02/22/2019  . Pain in left knee 01/25/2019  . Dysphagia 09/15/2018  . Herpes zoster without complication 01/74/9449  . 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/29/2015  . Essential tremor 12/14/2013  . Osteopenia 06/11/2013  . OA (osteoarthritis) of knee 02/24/2012  . Prediabetes 04/06/2009  . NONTOXIC MULTINODULAR GOITER 03/03/2008  . Hyperlipidemia 12/02/2007  . Allergic rhinitis 12/02/2007  . Personal history of malignant neoplasm of breast 12/02/2007  . History of skin cancer 12/02/2007  . Vitamin D deficiency 12/02/2007    Goals Addressed   None     Follow-Up:  Pharmacist Review   Called patient for general adherence call. The patient stated that over all her health is very well. Denies any adverse side effects of her medications.   The patient did stated that she was taken off of Tramadol but [facility] keeps trying to fill medication she requested that I give them a call to inform them that she is no longer taking that  medication.   On 04/06/2020 she was started on Belsomra 20mg  one as needed at bedtime she stated that she normally takes the Belsomra every other day around 9:30 pm but waked up around 2 am. The patient stated that she gets around 4-5 hours of sleep after taking the medication. She would like to know if it is possible to take 2 pills with the hope  she can get 7-8 hours. Will forward information to clinical pharmacist.     Rosendo Gros, [facility]  Practice Team Manager/ CPA (Clinical Pharmacist Assistant) [phone]

## 2020-04-18 NOTE — Progress Notes (Signed)
Contacted patient to discuss sleep medication. Pt reports she takes Belsomra 20 mg at bedtime and it does well to help her fall asleep, but she wakes up around 2am and stays awake until morning. We discussed that 20 mg is the maximum dose for Belsomra, there is no benefit beyond that dose.   Pt also has an active prescription for trazodone that she has not been using. Advised patient to take trazodone 25 mg with Belsomra 20 mg within 30 min of bedtime to see if this helps. Pt voiced understanding.

## 2020-04-19 DIAGNOSIS — H35033 Hypertensive retinopathy, bilateral: Secondary | ICD-10-CM | POA: Diagnosis not present

## 2020-04-19 DIAGNOSIS — H52213 Irregular astigmatism, bilateral: Secondary | ICD-10-CM | POA: Diagnosis not present

## 2020-04-19 DIAGNOSIS — H2513 Age-related nuclear cataract, bilateral: Secondary | ICD-10-CM | POA: Diagnosis not present

## 2020-04-19 DIAGNOSIS — H25013 Cortical age-related cataract, bilateral: Secondary | ICD-10-CM | POA: Diagnosis not present

## 2020-04-19 DIAGNOSIS — H2512 Age-related nuclear cataract, left eye: Secondary | ICD-10-CM | POA: Diagnosis not present

## 2020-04-19 DIAGNOSIS — H35362 Drusen (degenerative) of macula, left eye: Secondary | ICD-10-CM | POA: Diagnosis not present

## 2020-05-12 ENCOUNTER — Telehealth: Payer: Self-pay | Admitting: Internal Medicine

## 2020-05-12 MED ORDER — TRAZODONE HCL 50 MG PO TABS
25.0000 mg | ORAL_TABLET | Freq: Every evening | ORAL | 1 refills | Status: DC
Start: 2020-05-12 — End: 2020-06-19

## 2020-05-12 NOTE — Telephone Encounter (Signed)
    Patient requesting refill for traZODone (DESYREL) 50 MG tablet to [id number] [address], [place]

## 2020-05-12 NOTE — Telephone Encounter (Signed)
I thought she was taking belsomra - is that what she wants a refill of?

## 2020-05-23 DIAGNOSIS — H25812 Combined forms of age-related cataract, left eye: Secondary | ICD-10-CM | POA: Diagnosis not present

## 2020-05-23 DIAGNOSIS — H2512 Age-related nuclear cataract, left eye: Secondary | ICD-10-CM | POA: Diagnosis not present

## 2020-06-14 DIAGNOSIS — H2511 Age-related nuclear cataract, right eye: Secondary | ICD-10-CM | POA: Diagnosis not present

## 2020-06-14 DIAGNOSIS — H25011 Cortical age-related cataract, right eye: Secondary | ICD-10-CM | POA: Diagnosis not present

## 2020-06-15 ENCOUNTER — Encounter: Payer: Self-pay | Admitting: Internal Medicine

## 2020-06-15 DIAGNOSIS — C4441 Basal cell carcinoma of skin of scalp and neck: Secondary | ICD-10-CM | POA: Diagnosis not present

## 2020-06-15 DIAGNOSIS — D485 Neoplasm of uncertain behavior of skin: Secondary | ICD-10-CM | POA: Diagnosis not present

## 2020-06-15 DIAGNOSIS — Z85828 Personal history of other malignant neoplasm of skin: Secondary | ICD-10-CM | POA: Diagnosis not present

## 2020-06-15 DIAGNOSIS — H35039 Hypertensive retinopathy, unspecified eye: Secondary | ICD-10-CM | POA: Insufficient documentation

## 2020-06-19 ENCOUNTER — Telehealth: Payer: Self-pay | Admitting: Pharmacist

## 2020-06-19 ENCOUNTER — Encounter: Payer: Self-pay | Admitting: Internal Medicine

## 2020-06-19 ENCOUNTER — Ambulatory Visit (INDEPENDENT_AMBULATORY_CARE_PROVIDER_SITE_OTHER): Payer: Medicare HMO | Admitting: Internal Medicine

## 2020-06-19 ENCOUNTER — Other Ambulatory Visit: Payer: Self-pay

## 2020-06-19 VITALS — BP 148/82 | HR 71 | Temp 98.0°F | Ht 64.0 in | Wt 235.0 lb

## 2020-06-19 DIAGNOSIS — Z6841 Body Mass Index (BMI) 40.0 and over, adult: Secondary | ICD-10-CM | POA: Diagnosis not present

## 2020-06-19 DIAGNOSIS — F419 Anxiety disorder, unspecified: Secondary | ICD-10-CM | POA: Diagnosis not present

## 2020-06-19 DIAGNOSIS — G4709 Other insomnia: Secondary | ICD-10-CM | POA: Diagnosis not present

## 2020-06-19 DIAGNOSIS — F32A Depression, unspecified: Secondary | ICD-10-CM | POA: Diagnosis not present

## 2020-06-19 DIAGNOSIS — Z23 Encounter for immunization: Secondary | ICD-10-CM | POA: Diagnosis not present

## 2020-06-19 MED ORDER — MIRTAZAPINE 15 MG PO TABS: 15.0000 mg | ORAL_TABLET | Freq: Every day | ORAL | 5 refills | Status: DC

## 2020-06-19 NOTE — Progress Notes (Signed)
Subjective:    Patient ID: Tanya [name], female    DOB: 03-13-1946, 74 y.o.   MRN: [id number]  HPI The patient is here for an acute visit.   She is irritable and she is not sleeping.  Her mom has dementia and fell and broke her femur.  Her mom is now in a rehab and they are trying to figure it out she will be going home or will need to be placed.  She is so irritable and needs something to help calm her down.   She is taking the belsomra and trazodone.  They are not working.   She feels she probably has some depression and she is anxious.   Medications and allergies reviewed with patient and updated if appropriate.  Patient Active Problem List   Diagnosis Date Noted  . Hypertensive retinopathy 06/15/2020  . Insomnia 12/17/2019  . Vertigo 02/22/2019  . Pain in left knee 01/25/2019  . Dysphagia 09/15/2018  . Herpes zoster without complication 40/98/1191  . 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/29/2015  . Essential tremor 12/14/2013  . Osteopenia 06/11/2013  . OA (osteoarthritis) of knee 02/24/2012  . Prediabetes 04/06/2009  . NONTOXIC MULTINODULAR GOITER 03/03/2008  . Hyperlipidemia 12/02/2007  . Allergic rhinitis 12/02/2007  . Personal history of malignant neoplasm of breast 12/02/2007  . History of skin cancer 12/02/2007  . Vitamin D deficiency 12/02/2007    Current Outpatient Medications on File Prior to Visit  Medication Sig Dispense Refill  . azelastine (ASTELIN) 0.1 % nasal spray Place 1 spray into both nostrils 2 (two) times daily. Use in each nostril as directed     . BELSOMRA 20 MG TABS TAKE 20 MG BY MOUTH AT BEDTIME AS NEEDED. 90 tablet 0  . Biotin 10 MG TABS Take 1 tablet by mouth daily.    . Calcium Carb-Cholecalciferol (CALCIUM/VITAMIN D) 500-200 MG-UNIT TABS Take 1 tablet by mouth daily.    . cetirizine (ZYRTEC) 10 MG tablet TAKE 1 TABLET (10 MG TOTAL) BY MOUTH DAILY. KEEP SCHEDULED APPT FOR FUTURE REFILLS 90 tablet 0  . dimenhyDRINATE (DRAMAMINE) 50 MG tablet Take 50 mg  by mouth every 8 (eight) hours as needed for dizziness.    Marland Kitchen ELDERBERRY PO Take 1 capsule by mouth daily.    . fluticasone (FLONASE) 50 MCG/ACT nasal spray Place 1 spray into the nose daily.     Marland Kitchen ipratropium (ATROVENT) 0.06 % nasal spray Place 1 spray into both nostrils 2 (two) times a day.     . levocetirizine (XYZAL) 5 MG tablet TAKE 1 TABLET BY MOUTH EVERY DAY IN THE EVENING 90 tablet 2  . meclizine (ANTIVERT) 12.5 MG tablet Take 1 tablet (12.5 mg total) by mouth 3 (three) times daily as needed for dizziness. 15 tablet 0  . metroNIDAZOLE (METROCREAM) 0.75 % cream     . Multiple Vitamin (MULTIVITAMIN) tablet Take 1 tablet by mouth daily.    . simvastatin (ZOCOR) 20 MG tablet Take 1 tablet by mouth daily in the evening. 90 tablet 3  . traZODone (DESYREL) 50 MG tablet Take 0.5 tablets (25 mg total) by mouth at bedtime. 45 tablet 1   No current facility-administered medications on file prior to visit.    Past Medical History:  Diagnosis Date  . Arthritis   . Bursitis 2014   Left; Dr Maureen Ralphs  . Cancer ([place]) 1986   breast cancer L  . Hyperlipidemia   . Lymphedema 2000   LUE  . perennial allergies   . PONV (  postoperative nausea and vomiting)   . Skin cancer    basil cell    Past Surgical History:  Procedure Laterality Date  . ABDOMINAL HYSTERECTOMY  1990   & BSO for fibroids  . ANKLE FRACTURE SURGERY  2006  . BREAST RECONSTRUCTION  1986  . BREAST SURGERY  1986   double mastectonmy fror cancer on L  . COLONOSCOPY  04/14/2006   Tics ; Dr Earlean Shawl  . KNEE ARTHROSCOPY  2001 & 2011   L & R  . TOTAL KNEE ARTHROPLASTY  02/24/2012   Procedure: TOTAL KNEE ARTHROPLASTY;  Surgeon: Gearlean Alf, MD;  Location: [facility];  Service: Orthopedics;  Laterality: Right;  . TOTAL KNEE ARTHROPLASTY Left 02/01/2019   Procedure: TOTAL KNEE ARTHROPLASTY;  Surgeon: Gaynelle Arabian, MD;  Location: [facility];  Service: Orthopedics;  Laterality: Left;  65min  . WISDOM TOOTH EXTRACTION      Social History    Socioeconomic History  . Marital status: Married    Spouse name: Not on file  . Number of children: Not on file  . Years of education: Not on file  . Highest education level: Not on file  Occupational History  . Not on file  Tobacco Use  . Smoking status: Never Smoker  . Smokeless tobacco: Never Used  Vaping Use  . Vaping Use: Never used  Substance and Sexual Activity  . Alcohol use: Yes    Comment:  very rarely, 1-2 per month  . Drug use: No  . Sexual activity: Not Currently  Other Topics Concern  . Not on file  Social History Narrative  . Not on file   Social Determinants of Health   Financial Resource Strain:   . Difficulty of Paying Living Expenses: Not on file  Food Insecurity:   . Worried About Charity fundraiser in the Last Year: Not on file  . Ran Out of Food in the Last Year: Not on file  Transportation Needs:   . Lack of Transportation (Medical): Not on file  . Lack of Transportation (Non-Medical): Not on file  Physical Activity:   . Days of Exercise per Week: Not on file  . Minutes of Exercise per Session: Not on file  Stress:   . Feeling of Stress : Not on file  Social Connections:   . Frequency of Communication with Friends and Family: Not on file  . Frequency of Social Gatherings with Friends and Family: Not on file  . Attends Religious Services: Not on file  . Active Member of Clubs or Organizations: Not on file  . Attends Archivist Meetings: Not on file  . Marital Status: Not on file    Family History  Problem Relation Age of Onset  . Hypertension Mother   . Heart disease Mother        pacer  . Colon polyps Mother        pre cancerous  . Glaucoma Mother   . Dementia Mother   . Heart attack Maternal Grandmother 94  . Hypertension Maternal Grandmother   . Stroke Maternal Grandfather 63  . Hypertension Maternal Grandfather   . Heart attack Maternal Uncle 65  . Cancer Neg Hx   . Colon cancer Neg Hx   . Esophageal cancer Neg Hx    . Rectal cancer Neg Hx   . Stomach cancer Neg Hx   . Pancreatic cancer Neg Hx     Review of Systems  Constitutional: Negative for fever.  Respiratory: Negative for  shortness of breath.   Cardiovascular: Negative for chest pain and palpitations.  Psychiatric/Behavioral: Positive for dysphoric mood and sleep disturbance. The patient is nervous/anxious.        Objective:   Vitals:   06/19/20 1131  BP: (!) 148/82  Pulse: 71  Temp: 98 F (36.7 C)  SpO2: 99%   BP Readings from Last 3 Encounters:  06/19/20 (!) 148/82  12/17/19 (!) 140/92  04/15/19 (!) 157/93   Wt Readings from Last 3 Encounters:  06/19/20 235 lb (106.6 kg)  12/17/19 231 lb (104.8 kg)  04/15/19 223 lb (101.2 kg)   Body mass index is 40.34 kg/m.   Physical Exam Constitutional:      General: She is not in acute distress.    Appearance: Normal appearance. She is not ill-appearing.  Skin:    General: Skin is warm and dry.  Neurological:     Mental Status: She is alert.  Psychiatric:        Mood and Affect: Mood normal.        Behavior: Behavior normal.        Thought Content: Thought content normal.        Judgment: Judgment normal.            Assessment & Plan:    See Problem List for Assessment and Plan of chronic medical problems.    This visit occurred during the SARS-CoV-2 public health emergency.  Safety protocols were in place, including screening questions prior to the visit, additional usage of staff PPE, and extensive cleaning of exam room while observing appropriate contact time as indicated for disinfecting solutions.

## 2020-06-19 NOTE — Assessment & Plan Note (Addendum)
Chronic Trazodone and Belsomra are not effective-has taken them together-we will discontinue both There is some element of depression and anxiety related to her mother having dementia and everything she is going through.  Also has chronic insomnia Trial of Remeron 15 mg nightly-May need to titrate.  Hopefully this will help both her sleep and the element of depression and anxiety She will update me via MyChart and if this is not effective we will consider changing her to Ambien or [place]

## 2020-06-19 NOTE — Patient Instructions (Addendum)
Stop trazodone and Belsomra.   Start mirtazapine at night 15 mg.     Update me via mychart.

## 2020-06-19 NOTE — Progress Notes (Signed)
° ° °  Chronic Care Management Pharmacy Assistant   Name: Tanya [name]  MRN: [id number] DOB: 1945/11/26  Reason for Encounter: General Adherence Call   PCP : Binnie Rail, MD  Allergies:   Allergies  Allergen Reactions   Hydromorphone     whelps on skin    Methocarbamol     Whelps on skin    Oxycodone Hives    Blister on back   Prednisone     Reaction=wired per patient    Medications: Outpatient Encounter Medications as of 06/19/2020  Medication Sig   azelastine (ASTELIN) 0.1 % nasal spray Place 1 spray into both nostrils 2 (two) times daily. Use in each nostril as directed    Biotin 10 MG TABS Take 1 tablet by mouth daily.   Calcium Carb-Cholecalciferol (CALCIUM/VITAMIN D) 500-200 MG-UNIT TABS Take 1 tablet by mouth daily.   cetirizine (ZYRTEC) 10 MG tablet TAKE 1 TABLET (10 MG TOTAL) BY MOUTH DAILY. KEEP SCHEDULED APPT FOR FUTURE REFILLS   dimenhyDRINATE (DRAMAMINE) 50 MG tablet Take 50 mg by mouth every 8 (eight) hours as needed for dizziness.   ELDERBERRY PO Take 1 capsule by mouth daily.   fluticasone (FLONASE) 50 MCG/ACT nasal spray Place 1 spray into the nose daily.    ipratropium (ATROVENT) 0.06 % nasal spray Place 1 spray into both nostrils 2 (two) times a day.    levocetirizine (XYZAL) 5 MG tablet TAKE 1 TABLET BY MOUTH EVERY DAY IN THE EVENING   meclizine (ANTIVERT) 12.5 MG tablet Take 1 tablet (12.5 mg total) by mouth 3 (three) times daily as needed for dizziness.   metroNIDAZOLE (METROCREAM) 0.75 % cream    mirtazapine (REMERON) 15 MG tablet Take 1 tablet (15 mg total) by mouth at bedtime.   Multiple Vitamin (MULTIVITAMIN) tablet Take 1 tablet by mouth daily.   simvastatin (ZOCOR) 20 MG tablet Take 1 tablet by mouth daily in the evening.   No facility-administered encounter medications on file as of 06/19/2020.    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 The patient states that she was seen today by Dr. Billey [name] and her only issue was that she is not getting enough sleep. Dr. Quay [name] added Mitazapine 15 mg to help with the issue. 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 Also the patient states that she has hypertension but does not check her blood pressure regularly. I let the patient know that I would pass along the information to the clinical pharmacist Tanya [name].      Wendy Poet, Clinical Pharmacist Assistant [facility]

## 2020-06-19 NOTE — Assessment & Plan Note (Signed)
New problem In addition to insomnia she is experiencing some difficulty with anxiety and depression Most of this is related to her mother's dementia and everything she has been going through She would benefit from a daily medication to help with both We will try mirtazapine 15 mg nightly to see if this helps with her sleep and her depression and anxiety-May need to increase dose If this is not effective we will try a separate SSRI and sleep medication

## 2020-06-20 DIAGNOSIS — H52221 Regular astigmatism, right eye: Secondary | ICD-10-CM | POA: Diagnosis not present

## 2020-06-20 DIAGNOSIS — H25011 Cortical age-related cataract, right eye: Secondary | ICD-10-CM | POA: Diagnosis not present

## 2020-06-20 DIAGNOSIS — H2511 Age-related nuclear cataract, right eye: Secondary | ICD-10-CM | POA: Diagnosis not present

## 2020-06-20 DIAGNOSIS — H25811 Combined forms of age-related cataract, right eye: Secondary | ICD-10-CM | POA: Diagnosis not present

## 2020-06-29 ENCOUNTER — Encounter: Payer: Self-pay | Admitting: Internal Medicine

## 2020-06-29 DIAGNOSIS — G4709 Other insomnia: Secondary | ICD-10-CM

## 2020-06-30 MED ORDER — ESZOPICLONE 2 MG PO TABS: 2.0000 mg | ORAL_TABLET | Freq: Every evening | ORAL | 0 refills | Status: DC | PRN

## 2020-06-30 NOTE — Addendum Note (Signed)
Addended by: Binnie Rail on: 06/30/2020 07:54 AM   Modules accepted: Orders

## 2020-07-05 DIAGNOSIS — C4441 Basal cell carcinoma of skin of scalp and neck: Secondary | ICD-10-CM | POA: Diagnosis not present

## 2020-07-05 DIAGNOSIS — Z85828 Personal history of other malignant neoplasm of skin: Secondary | ICD-10-CM | POA: Diagnosis not present

## 2020-07-11 ENCOUNTER — Other Ambulatory Visit: Payer: Self-pay | Admitting: Internal Medicine

## 2020-07-13 ENCOUNTER — Telehealth: Payer: Self-pay | Admitting: Pharmacist

## 2020-07-13 NOTE — Progress Notes (Signed)
° ° °  Chronic Care Management Pharmacy Assistant   Name: Tanya [name]  MRN: [id number] DOB: March 06, 1946  Reason for Encounter: Chart Review   PCP : Pincus Sanes, MD  Allergies:   Allergies  Allergen Reactions   Hydromorphone     whelps on skin    Methocarbamol     Whelps on skin    Oxycodone Hives    Blister on back   Prednisone     Reaction=wired per patient    Medications: Outpatient Encounter Medications as of 07/13/2020  Medication Sig   azelastine (ASTELIN) 0.1 % nasal spray Place 1 spray into both nostrils 2 (two) times daily. Use in each nostril as directed    Biotin 10 MG TABS Take 1 tablet by mouth daily.   Calcium Carb-Cholecalciferol (CALCIUM/VITAMIN D) 500-200 MG-UNIT TABS Take 1 tablet by mouth daily.   cetirizine (ZYRTEC) 10 MG tablet TAKE 1 TABLET (10 MG TOTAL) BY MOUTH DAILY. KEEP SCHEDULED APPT FOR FUTURE REFILLS   dimenhyDRINATE (DRAMAMINE) 50 MG tablet Take 50 mg by mouth every 8 (eight) hours as needed for dizziness.   ELDERBERRY PO Take 1 capsule by mouth daily.   eszopiclone (LUNESTA) 2 MG TABS tablet Take 1 tablet (2 mg total) by mouth at bedtime as needed for sleep. Take immediately before bedtime   fluticasone (FLONASE) 50 MCG/ACT nasal spray Place 1 spray into the nose daily.    ipratropium (ATROVENT) 0.06 % nasal spray Place 1 spray into both nostrils 2 (two) times a day.    levocetirizine (XYZAL) 5 MG tablet TAKE 1 TABLET BY MOUTH EVERY DAY IN THE EVENING   meclizine (ANTIVERT) 12.5 MG tablet Take 1 tablet (12.5 mg total) by mouth 3 (three) times daily as needed for dizziness.   metroNIDAZOLE (METROCREAM) 0.75 % cream    mirtazapine (REMERON) 15 MG tablet Take 1 tablet (15 mg total) by mouth at bedtime.   Multiple Vitamin (MULTIVITAMIN) tablet Take 1 tablet by mouth daily.   simvastatin (ZOCOR) 20 MG tablet Take 1 tablet by mouth daily in the evening.   No facility-administered encounter medications on file as of 07/13/2020.     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   No gaps in adherence identified. Patient has follow up scheduled with pharmacy team. No further action required.  Horald Pollen, Clinical Pharmacist Assistant [facility]

## 2020-07-24 ENCOUNTER — Other Ambulatory Visit: Payer: Self-pay | Admitting: Internal Medicine

## 2020-07-26 ENCOUNTER — Telehealth: Payer: Medicare HMO

## 2020-07-26 NOTE — Chronic Care Management (AMB) (Unsigned)
Chronic Care Management Pharmacy Note  07/26/2020 Name:  Tanya [name] MRN:  [id number] DOB:  July 07, 1946  Subjective: Tanya [name] is an 75 y.o. year old female who is a primary patient of Burns, Claudina Lick, MD.  The CCM team was consulted for assistance with disease management and care coordination needs.    Engaged with patient by telephone for follow up visit in response to provider referral for pharmacy case management and/or care coordination services.   Consent to Services:  The patient was given the following information about Chronic Care Management services today, agreed to services, and gave verbal consent: 1. CCM service includes personalized support from designated clinical staff supervised by the primary care provider, including individualized plan of care and coordination with other care providers 2. 24/7 contact phone numbers for assistance for urgent and routine care needs. 3. Service will only be billed when office clinical staff spend 20 minutes or more in a month to coordinate care. 4. Only one practitioner may furnish and bill the service in a calendar month. 5.The patient may stop CCM services at any time (effective at the end of the month) by phone call to the office staff. 6. The patient will be responsible for cost sharing (co-pay) of up to 20% of the service fee (after annual deductible is met). Patient agreed to services and consent obtained.   Recent office visits: ***  Recent consult visits: ***  Objective:  Lab Results  Component Value Date   CREATININE 0.81 12/17/2019   BUN 20 12/17/2019   GFR 68.70 12/17/2019   GFRNONAA >60 02/12/2019   GFRAA >60 02/12/2019   NA 140 12/17/2019   K 4.0 12/17/2019   CALCIUM 9.4 12/17/2019   CO2 31 12/17/2019    Lab Results  Component Value Date/Time   HGBA1C 5.4 01/28/2019 02:00 PM   HGBA1C 5.5 09/15/2018 09:04 AM   GFR 68.70 12/17/2019 12:05 PM   GFR 73.08 09/15/2018 09:04 AM    Last diabetic Eye exam: No  results found for: HMDIABEYEEXA  Last diabetic Foot exam: No results found for: HMDIABFOOTEX   Lab Results  Component Value Date   CHOL 196 12/17/2019   HDL 69.00 12/17/2019   LDLCALC 101 (H) 12/17/2019   LDLDIRECT 148.0 07/10/2010   TRIG 130.0 12/17/2019   CHOLHDL 3 12/17/2019    Hepatic Function Latest Ref Rng & Units 12/17/2019 02/12/2019 01/28/2019  Total Protein 6.0 - 8.3 g/dL 7.2 6.1(L) 7.5  Albumin 3.5 - 5.2 g/dL 4.4 3.2(L) 4.4  AST 0 - 37 U/L _0 ALT 0 - 35 U/L _1 Alk Phosphatase 39 - 117 U/L 74 73 59  Total Bilirubin 0.2 - 1.2 mg/dL 0.4 0.6 0.6  Bilirubin, Direct 0.0 - 0.3 mg/dL - - -    Lab Results  Component Value Date/Time   TSH 1.35 12/17/2019 12:05 PM   TSH 3.94 09/15/2018 09:04 AM   FREET4 0.92 09/05/2017 09:24 AM   FREET4 0.7 04/06/2009 12:09 PM    CBC Latest Ref Rng & Units 12/17/2019 02/12/2019 02/02/2019  WBC 4.0 - 10.5 K/uL 8.8 8.9 12.7(H)  Hemoglobin 12.0 - 15.0 g/dL 14.2 12.0 12.3  Hematocrit 36.0 - 46.0 % 42.8 37.6 38.1  Platelets 150.0 - 400.0 K/uL 345.0 535(H) 275    Lab Results  Component Value Date/Time   VD25OH 46.78 12/17/2019 12:05 PM   VD25OH 47.56 09/05/2017 09:24 AM    Clinical ASCVD: {YES/NO:21197} The 10-year ASCVD risk score Mikey Bussing DC  Brooke Bonito., et al., 2013) is: 23.2%   Values used to calculate the score:     Age: 81 years     Sex: Female     Is Non-Hispanic African American: No     Diabetic: No     Tobacco smoker: No     Systolic Blood Pressure: 254 mmHg     Is BP treated: No     HDL Cholesterol: 69 mg/dL     Total Cholesterol: 196 mg/dL    Other: (CHADS2VASc if Afib, PHQ9 if depression, MMRC or CAT for COPD, ACT, DEXA)  BP Readings from Last 3 Encounters:  06/19/20 (!) 148/82  12/17/19 (!) 140/92  04/15/19 (!) 157/93   Pulse Readings from Last 3 Encounters:  06/19/20 71  12/17/19 86  04/15/19 80   Wt Readings from Last 3 Encounters:  06/19/20 235 lb (106.6 kg)  12/17/19 231 lb (104.8 kg)  04/15/19 223 lb  (101.2 kg)    Assessment/Interventions: Review of patient past medical history, allergies, medications, health status, including review of consultants reports, laboratory and other test data, was performed as part of comprehensive evaluation and provision of chronic care management services.   SDOH:  (Social Determinants of Health) assessments and interventions performed:    CCM Care Plan  Allergies  Allergen Reactions  . Hydromorphone     whelps on skin   . Methocarbamol     Whelps on skin   . Oxycodone Hives    Blister on back  . Prednisone     Reaction=wired per patient    Medications Reviewed Today    Reviewed by Marcina Millard, CMA (Certified Medical Assistant) on 06/19/20 at 1132  Med List Status: <None>  Medication Order Taking? Sig Documenting Provider Last Dose Status Informant  azelastine (ASTELIN) 0.1 % nasal spray [id number] Yes Place 1 spray into both nostrils 2 (two) times daily. Use in each nostril as directed  [provider] Taking Active Self  BELSOMRA 20 MG TABS [id number] Yes TAKE 20 MG BY MOUTH AT BEDTIME AS NEEDED. Binnie Rail, MD Taking Active   Biotin 10 MG TABS [id number] Yes Take 1 tablet by mouth daily. [provider] Taking Active Self  Calcium Carb-Cholecalciferol (CALCIUM/VITAMIN D) 500-200 MG-UNIT TABS [id number] Yes Take 1 tablet by mouth daily. [provider] Taking Active Self  cetirizine (ZYRTEC) 10 MG tablet [id number] Yes TAKE 1 TABLET (10 MG TOTAL) BY MOUTH DAILY. KEEP SCHEDULED APPT FOR FUTURE REFILLS Binnie Rail, MD Taking Active   dimenhyDRINATE (DRAMAMINE) 50 MG tablet [id number] Yes Take 50 mg by mouth every 8 (eight) hours as needed for dizziness. [provider] Taking Active Self  ELDERBERRY PO [id number] Yes Take 1 capsule by mouth daily. [provider] Taking Active   fluticasone (FLONASE) 50 MCG/ACT nasal spray [id number] Yes Place 1 spray into the nose daily.  [provider]  Taking Active Self  ipratropium (ATROVENT) 0.06 % nasal spray [id number] Yes Place 1 spray into both nostrils 2 (two) times a day.  [provider] Taking Active Self  levocetirizine (XYZAL) 5 MG tablet [id number] Yes TAKE 1 TABLET BY MOUTH EVERY DAY IN THE EVENING Burns, Claudina Lick, MD Taking Active   meclizine (ANTIVERT) 12.5 MG tablet [id number] Yes Take 1 tablet (12.5 mg total) by mouth 3 (three) times daily as needed for dizziness. Lajean Saver, MD Taking Active   metroNIDAZOLE (METROCREAM) 0.75 % cream [id number] Yes  [provider] Taking Active   Multiple Vitamin (MULTIVITAMIN) tablet [id number]  Yes Take 1 tablet by mouth daily. [provider] Taking Active Self  simvastatin (ZOCOR) 20 MG tablet [id number] Yes Take 1 tablet by mouth daily in the evening. Binnie Rail, MD Taking Active   traZODone (DESYREL) 50 MG tablet [id number] Yes Take 0.5 tablets (25 mg total) by mouth at bedtime. Binnie Rail, MD Taking Active           Patient Active Problem List   Diagnosis Date Noted  . Anxiety and depression 06/19/2020  . Hypertensive retinopathy 06/15/2020  . Insomnia 12/17/2019  . Vertigo 02/22/2019  . Pain in left knee 01/25/2019  . Dysphagia 09/15/2018  . Herpes zoster without complication 97/47/1855  . 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/29/2015  . Essential tremor 12/14/2013  . Osteopenia 06/11/2013  . OA (osteoarthritis) of knee 02/24/2012  . Prediabetes 04/06/2009  . NONTOXIC MULTINODULAR GOITER 03/03/2008  . Hyperlipidemia 12/02/2007  . Allergic rhinitis 12/02/2007  . Personal history of malignant neoplasm of breast 12/02/2007  . History of skin cancer 12/02/2007  . Vitamin D deficiency 12/02/2007    Conditions to be addressed/monitored:  {CCM ASSESSMENT DZ OPTIONS:25047}  There are no care plans to display for this patient.   Medication Assistance: {MEDASSISTANCEINFO:25044}  Patient's preferred pharmacy is:  [id number] [place] [place]Phone: [phone]: [phone] Uses pill box? {Yes or If no, why not?:20788} Pt endorses ***% compliance  We discussed: {Pharmacy options:24294}  Plan to: {US Pharmacy PVTVN:50413}   Follow Up:  {FOLLOWUP:24991}  Plan: {CM FOLLOW UP PLAN:25073}  SIG***

## 2020-08-23 ENCOUNTER — Ambulatory Visit (INDEPENDENT_AMBULATORY_CARE_PROVIDER_SITE_OTHER): Payer: Medicare HMO

## 2020-08-23 DIAGNOSIS — Z0000 Encounter for general adult medical examination without abnormal findings: Secondary | ICD-10-CM

## 2020-08-23 NOTE — Patient Instructions (Signed)
Tanya [name] , Thank you for taking time to come for your Medicare Wellness Visit. I appreciate your ongoing commitment to your health goals. Please review the following plan we discussed and let me know if I can assist you in the future.   Screening recommendations/referrals: Colonoscopy: 04/15/2019; no repeat due to age Mammogram: 09/17/2019 Bone Density: 09/17/2019; due every 2 years Recommended yearly ophthalmology/optometry visit for glaucoma screening and checkup Recommended yearly dental visit for hygiene and checkup  Vaccinations: Influenza vaccine: 06/19/2020 Pneumococcal vaccine: 07/10/2009, 04/05/2015 Tdap vaccine: 01/05/2020; due every 10 years Shingles vaccine: 08/07/2017, 05/14/2018   Covid-19: 08/03/2019, 08/24/2019  Advanced directives: Please bring a copy of your health care power of attorney and living will to the office at your convenience.  Conditions/risks identified: Yes; Reviewed health maintenance screenings with patient today and relevant education, vaccines, and/or referrals were provided. Please continue to do your personal lifestyle choices by: daily care of teeth and gums, regular physical activity (goal should be 5 days a week for 30 minutes), eat a healthy diet, avoid tobacco and drug use, limiting any alcohol intake, taking a low-dose aspirin (if not allergic or have been advised by your provider otherwise) and taking vitamins and minerals as recommended by your provider. Continue doing brain stimulating activities (puzzles, reading, adult coloring books, staying active) to keep memory sharp. Continue to eat heart healthy diet (full of fruits, vegetables, whole grains, lean protein, water--limit salt, fat, and sugar intake) and increase physical activity as tolerated.  Next appointment: Please schedule your next Medicare Wellness Visit with your Nurse Health Advisor in 1 year by calling [phone].  Preventive Care 77 Years and Older, Female Preventive care refers to  lifestyle choices and visits with your health care provider that can promote health and wellness. What does preventive care include?  A yearly physical exam. This is also called an annual well check.  Dental exams once or twice a year.  Routine eye exams. Ask your health care provider how often you should have your eyes checked.  Personal lifestyle choices, including:  Daily care of your teeth and gums.  Regular physical activity.  Eating a healthy diet.  Avoiding tobacco and drug use.  Limiting alcohol use.  Practicing safe sex.  Taking low-dose aspirin every day.  Taking vitamin and mineral supplements as recommended by your health care provider. What happens during an annual well check? The services and screenings done by your health care provider during your annual well check will depend on your age, overall health, lifestyle risk factors, and family history of disease. Counseling  Your health care provider may ask you questions about your:  Alcohol use.  Tobacco use.  Drug use.  Emotional well-being.  Home and relationship well-being.  Sexual activity.  Eating habits.  History of falls.  Memory and ability to understand (cognition).  Work and work Statistician.  Reproductive health. Screening  You may have the following tests or measurements:  Height, weight, and BMI.  Blood pressure.  Lipid and cholesterol levels. These may be checked every 5 years, or more frequently if you are over 68 years old.  Skin check.  Lung cancer screening. You may have this screening every year starting at age 25 if you have a 30-pack-year history of smoking and currently smoke or have quit within the past 15 years.  Fecal occult blood test (FOBT) of the stool. You may have this test every year starting at age 72.  Flexible sigmoidoscopy or colonoscopy. You may have a sigmoidoscopy  every 5 years or a colonoscopy every 10 years starting at age 5.  Hepatitis C blood  test.  Hepatitis B blood test.  Sexually transmitted disease (STD) testing.  Diabetes screening. This is done by checking your blood sugar (glucose) after you have not eaten for a while (fasting). You may have this done every 1-3 years.  Bone density scan. This is done to screen for osteoporosis. You may have this done starting at age 55.  Mammogram. This may be done every 1-2 years. Talk to your health care provider about how often you should have regular mammograms. Talk with your health care provider about your test results, treatment options, and if necessary, the need for more tests. Vaccines  Your health care provider may recommend certain vaccines, such as:  Influenza vaccine. This is recommended every year.  Tetanus, diphtheria, and acellular pertussis (Tdap, Td) vaccine. You may need a Td booster every 10 years.  Zoster vaccine. You may need this after age 72.  Pneumococcal 13-valent conjugate (PCV13) vaccine. One dose is recommended after age 4.  Pneumococcal polysaccharide (PPSV23) vaccine. One dose is recommended after age 57. Talk to your health care provider about which screenings and vaccines you need and how often you need them. This information is not intended to replace advice given to you by your health care provider. Make sure you discuss any questions you have with your health care provider. Document Released: 07/28/2015 Document Revised: 03/20/2016 Document Reviewed: 05/02/2015 Elsevier Interactive Patient Education  2017 [place] Prevention in the Home Falls can cause injuries. They can happen to people of all ages. There are many things you can do to make your home safe and to help prevent falls. What can I do on the outside of my home?  Regularly fix the edges of walkways and driveways and fix any cracks.  Remove anything that might make you trip as you walk through a door, such as a raised step or threshold.  Trim any bushes or trees on the  path to your home.  Use bright outdoor lighting.  Clear any walking paths of anything that might make someone trip, such as rocks or tools.  Regularly check to see if handrails are loose or broken. Make sure that both sides of any steps have handrails.  Any raised decks and porches should have guardrails on the edges.  Have any leaves, snow, or ice cleared regularly.  Use sand or salt on walking paths during winter.  Clean up any spills in your garage right away. This includes oil or grease spills. What can I do in the bathroom?  Use night lights.  Install grab bars by the toilet and in the tub and shower. Do not use towel bars as grab bars.  Use non-skid mats or decals in the tub or shower.  If you need to sit down in the shower, use a plastic, non-slip stool.  Keep the floor dry. Clean up any water that spills on the floor as soon as it happens.  Remove soap buildup in the tub or shower regularly.  Attach bath mats securely with double-sided non-slip rug tape.  Do not have throw rugs and other things on the floor that can make you trip. What can I do in the bedroom?  Use night lights.  Make sure that you have a light by your bed that is easy to reach.  Do not use any sheets or blankets that are too big for your bed. They should  not hang down onto the floor.  Have a firm chair that has side arms. You can use this for support while you get dressed.  Do not have throw rugs and other things on the floor that can make you trip. What can I do in the kitchen?  Clean up any spills right away.  Avoid walking on wet floors.  Keep items that you use a lot in easy-to-reach places.  If you need to reach something above you, use a strong step stool that has a grab bar.  Keep electrical cords out of the way.  Do not use floor polish or wax that makes floors slippery. If you must use wax, use non-skid floor wax.  Do not have throw rugs and other things on the floor that can  make you trip. What can I do with my stairs?  Do not leave any items on the stairs.  Make sure that there are handrails on both sides of the stairs and use them. Fix handrails that are broken or loose. Make sure that handrails are as long as the stairways.  Check any carpeting to make sure that it is firmly attached to the stairs. Fix any carpet that is loose or worn.  Avoid having throw rugs at the top or bottom of the stairs. If you do have throw rugs, attach them to the floor with carpet tape.  Make sure that you have a light switch at the top of the stairs and the bottom of the stairs. If you do not have them, ask someone to add them for you. What else can I do to help prevent falls?  Wear shoes that:  Do not have high heels.  Have rubber bottoms.  Are comfortable and fit you well.  Are closed at the toe. Do not wear sandals.  If you use a stepladder:  Make sure that it is fully opened. Do not climb a closed stepladder.  Make sure that both sides of the stepladder are locked into place.  Ask someone to hold it for you, if possible.  Clearly mark and make sure that you can see:  Any grab bars or handrails.  First and last steps.  Where the edge of each step is.  Use tools that help you move around (mobility aids) if they are needed. These include:  Canes.  Walkers.  Scooters.  Crutches.  Turn on the lights when you go into a dark area. Replace any light bulbs as soon as they burn out.  Set up your furniture so you have a clear path. Avoid moving your furniture around.  If any of your floors are uneven, fix them.  If there are any pets around you, be aware of where they are.  Review your medicines with your doctor. Some medicines can make you feel dizzy. This can increase your chance of falling. Ask your doctor what other things that you can do to help prevent falls. This information is not intended to replace advice given to you by your health care  provider. Make sure you discuss any questions you have with your health care provider. Document Released: 04/27/2009 Document Revised: 12/07/2015 Document Reviewed: 08/05/2014 Elsevier Interactive Patient Education  2017 [employer].

## 2020-08-23 NOTE — Progress Notes (Signed)
I connected with Tanya [name] today by telephone and verified that I am speaking with the correct person using two identifiers. Location patient: home Location provider: work Persons participating in the virtual visit: Latese Dufault and Lisette Abu, LPN.   I discussed the limitations, risks, security and privacy concerns of performing an evaluation and management service by telephone and the availability of in person appointments. I also discussed with the patient that there may be a patient responsible charge related to this service. The patient expressed understanding and verbally consented to this telephonic visit.    Interactive audio and video telecommunications were attempted between this provider and patient, however failed, due to patient having technical difficulties OR patient did not have access to video capability.  We continued and completed visit with audio only.  Some vital signs may be absent or patient reported.   Time Spent with patient on telephone encounter: 40 minutes  Subjective:   Tanya [name] is a 75 y.o. female who presents for Medicare Annual (Subsequent) preventive examination.  Review of Systems    No ROS. Medicare Wellness Visit. Additional risk factors are reflected in social history. Cardiac Risk Factors include: advanced age (>35men, >43 women);dyslipidemia;family history of premature cardiovascular disease;obesity (BMI >30kg/m2)     Objective:    There were no vitals filed for this visit. There is no height or weight on file to calculate BMI.  Advanced Directives 08/23/2020 01/28/2020 05/31/2019 02/13/2019 02/01/2019 01/28/2019 06/12/2016  Does Patient Have a Medical Advance Directive? Yes Yes Yes Yes Yes Yes Yes  Type of Paramedic of [place];Living will [place];Living will [place];Living will [place];Living will [place];Living will [place];Living will [place];Living will  Does patient want to make changes to medical advance directive? No - Patient declined - - - No - Patient declined No - Patient declined -  Copy of [place] in Chart? No - copy requested - - No - copy requested Yes - validated most recent copy scanned in chart (See row information) Yes - validated most recent copy scanned in chart (See row information) -  Pre-existing out of facility DNR order (yellow form or pink MOST form) - - - - - - -    Current Medications (verified) Outpatient Encounter Medications as of 08/23/2020  Medication Sig  . azelastine (ASTELIN) 0.1 % nasal spray Place 1 spray into both nostrils 2 (two) times daily. Use in each nostril as directed   . Biotin 10 MG TABS Take 1 tablet by mouth daily.  . Calcium Carb-Cholecalciferol (CALCIUM/VITAMIN D) 500-200 MG-UNIT TABS Take 1 tablet by mouth daily.  . cetirizine (ZYRTEC) 10 MG tablet TAKE 1 TABLET (10 MG TOTAL) BY MOUTH DAILY. KEEP SCHEDULED APPT FOR FUTURE REFILLS  . dimenhyDRINATE (DRAMAMINE) 50 MG tablet Take 50 mg by mouth every 8 (eight) hours as needed for dizziness.  Marland Kitchen ELDERBERRY PO Take 1 capsule by mouth daily.  . eszopiclone (LUNESTA) 2 MG TABS tablet TAKE 1 TABLET (2 MG TOTAL) BY MOUTH AT BEDTIME AS NEEDED FOR SLEEP. TAKE IMMEDIATELY BEFORE BEDTIME (Patient taking differently: Take 4 mg by mouth at bedtime as needed for sleep. Take immediately before bedtime)  . fluticasone (FLONASE) 50 MCG/ACT nasal spray Place 1 spray into the nose daily.   Marland Kitchen ipratropium (ATROVENT) 0.06 % nasal spray Place 1 spray into both nostrils 2 (two) times a day.   . levocetirizine (  XYZAL) 5 MG tablet TAKE 1 TABLET BY MOUTH EVERY DAY IN THE EVENING  . meclizine (ANTIVERT) 12.5 MG tablet Take 1 tablet (12.5 mg total) by mouth 3 (three) times daily as needed for dizziness.  . metroNIDAZOLE (METROCREAM) 0.75 % cream   . mirtazapine (REMERON) 15 MG tablet Take  1 tablet (15 mg total) by mouth at bedtime.  . Multiple Vitamin (MULTIVITAMIN) tablet Take 1 tablet by mouth daily.  . simvastatin (ZOCOR) 20 MG tablet Take 1 tablet by mouth daily in the evening.   No facility-administered encounter medications on file as of 08/23/2020.    Allergies (verified) Hydromorphone, Methocarbamol, Oxycodone, and Prednisone   History: Past Medical History:  Diagnosis Date  . Arthritis   . Bursitis 2014   Left; Dr Maureen Ralphs  . Cancer ([place]) 1986   breast cancer L  . Hyperlipidemia   . Lymphedema 2000   LUE  . perennial allergies   . PONV (postoperative nausea and vomiting)   . Skin cancer    basil cell   Past Surgical History:  Procedure Laterality Date  . ABDOMINAL HYSTERECTOMY  1990   & BSO for fibroids  . ANKLE FRACTURE SURGERY  2006  . BREAST RECONSTRUCTION  1986  . BREAST SURGERY  1986   double mastectonmy fror cancer on L  . COLONOSCOPY  04/14/2006   Tics ; Dr Earlean Shawl  . KNEE ARTHROSCOPY  2001 & 2011   L & R  . TOTAL KNEE ARTHROPLASTY  02/24/2012   Procedure: TOTAL KNEE ARTHROPLASTY;  Surgeon: Gearlean Alf, MD;  Location: [facility];  Service: Orthopedics;  Laterality: Right;  . TOTAL KNEE ARTHROPLASTY Left 02/01/2019   Procedure: TOTAL KNEE ARTHROPLASTY;  Surgeon: Gaynelle Arabian, MD;  Location: [facility];  Service: Orthopedics;  Laterality: Left;  15min  . WISDOM TOOTH EXTRACTION     Family History  Problem Relation Age of Onset  . Hypertension Mother   . Heart disease Mother        pacer  . Colon polyps Mother        pre cancerous  . Glaucoma Mother   . Dementia Mother   . Heart attack Maternal Grandmother 94  . Hypertension Maternal Grandmother   . Stroke Maternal Grandfather 66  . Hypertension Maternal Grandfather   . Heart attack Maternal Uncle 65  . Cancer Neg Hx   . Colon cancer Neg Hx   . Esophageal cancer Neg Hx   . Rectal cancer Neg Hx   . Stomach cancer Neg Hx   . Pancreatic cancer Neg Hx    Social History   Socioeconomic  History  . Marital status: Married    Spouse name: Not on file  . Number of children: Not on file  . Years of education: Not on file  . Highest education level: Not on file  Occupational History  . Not on file  Tobacco Use  . Smoking status: Never Smoker  . Smokeless tobacco: Never Used  Vaping Use  . Vaping Use: Never used  Substance and Sexual Activity  . Alcohol use: Yes    Comment:  very rarely, 1-2 per month  . Drug use: No  . Sexual activity: Not Currently  Other Topics Concern  . Not on file  Social History Narrative  . Not on file   Social Determinants of Health   Financial Resource Strain: Low Risk   . Difficulty of Paying Living Expenses: Not hard at all  Food Insecurity: No Food Insecurity  .  Worried About Charity fundraiser in the Last Year: Never true  . Ran Out of Food in the Last Year: Never true  Transportation Needs: No Transportation Needs  . Lack of Transportation (Medical): No  . Lack of Transportation (Non-Medical): No  Physical Activity: Sufficiently Active  . Days of Exercise per Week: 5 days  . Minutes of Exercise per Session: 30 min  Stress: No Stress Concern Present  . Feeling of Stress : Not at all  Social Connections: Socially Integrated  . Frequency of Communication with Friends and Family: More than three times a week  . Frequency of Social Gatherings with Friends and Family: More than three times a week  . Attends Religious Services: More than 4 times per year  . Active Member of Clubs or Organizations: Yes  . Attends Archivist Meetings: More than 4 times per year  . Marital Status: Married    Tobacco Counseling Counseling given: Not Answered   Clinical Intake:  Pre-visit preparation completed: Yes  Pain : No/denies pain     Nutritional Risks: None Diabetes: No  How often do you need to have someone help you when you read instructions, pamphlets, or other written materials from your doctor or pharmacy?: 1 -  Never What is the last grade level you completed in school?: 2 years of college  Diabetic? no  Interpreter Needed?: No  Information entered by :: Lisette Abu, LPN   Activities of Daily Living In your present state of health, do you have any difficulty performing the following activities: 08/23/2020 06/19/2020  Hearing? N N  Vision? N N  Difficulty concentrating or making decisions? N N  Walking or climbing stairs? N N  Dressing or bathing? N N  Doing errands, shopping? N N  Preparing Food and eating ? N -  Using the Toilet? N -  In the past six months, have you accidently leaked urine? N -  Do you have problems with loss of bowel control? N -  Managing your Medications? N -  Managing your Finances? N -  Housekeeping or managing your Housekeeping? N -  Some recent data might be hidden    Patient Care Team: Binnie Rail, MD as PCP - General (Internal Medicine) Charlton Haws, [facility] as Pharmacist (Pharmacist)  Indicate any recent Medical Services you may have received from other than [facility] providers in the past year (date may be approximate).     Assessment:   This is a routine wellness examination for Tanya [name].  Hearing/Vision screen No exam data present  Dietary issues and exercise activities discussed: Current Exercise Habits: Home exercise routine, Type of exercise: walking;Other - see comments (stationary bike), Time (Minutes): 30, Frequency (Times/Week): 5, Weekly Exercise (Minutes/Week): 150, Intensity: Moderate, Exercise limited by: orthopedic condition(s)  Goals    . Pharmacy Care Plan     Current Barriers:  . Chronic Disease Management support, education, and care coordination needs related to HLD and allergic rhinitis  Pharmacist Clinical Goal(s):  Marland Kitchen Maintain LDL (bad cholesterol) < 100 . Ensure safety, efficacy, and affordability of medications . Maintain compliance with medications as prescribed  Interventions: . Comprehensive medication review  performed. . Discussed cholesterol and benefits of simvastatin . Discussed benefits of [facility] for medication delivery  Patient Self Care Activities:  . Self administers medications as prescribed, Calls pharmacy for medication refills, and Calls provider office for new concerns or questions  Initial goal documentation      Depression Screen PHQ 2/9 Scores  08/23/2020 12/17/2019 09/15/2018 04/21/2017 04/18/2016 06/23/2013  PHQ - 2 Score 0 0 0 0 0 0    Fall Risk Fall Risk  08/23/2020 12/17/2019 09/15/2018 04/21/2017 04/18/2016  Falls in the past year? 1 0 1 No No  Number falls in past yr: 0 0 0 - -  Injury with Fall? 0 0 1 - -  Risk for fall due to : Impaired balance/gait - - - -  Follow up Falls evaluation completed Falls evaluation completed - - -    FALL RISK PREVENTION PERTAINING TO THE HOME:  Any stairs in or around the home? Yes  If so, are there any without handrails? No  Home free of loose throw rugs in walkways, pet beds, electrical cords, etc? Yes  Adequate lighting in your home to reduce risk of falls? Yes   ASSISTIVE DEVICES UTILIZED TO PREVENT FALLS:  Life alert? No  Use of a cane, walker or w/c? Yes  Grab bars in the bathroom? No  Shower chair or bench in shower? No  Elevated toilet seat or a handicapped toilet? No   TIMED UP AND GO:  Was the test performed? No .  Length of time to ambulate 10 feet: 0 sec.   Gait steady and fast without use of assistive device  Cognitive Function: No flowsheet data found.         Immunizations Immunization History  Administered Date(s) Administered  . Fluad Quad(high Dose 65+) 06/19/2020  . Influenza Whole 04/06/2009, 06/15/2011  . Influenza, High Dose Seasonal PF 04/29/2014, 05/03/2018, 05/17/2019  . Influenza,inj,quad, With Preservative 05/15/2017, 05/03/2018  . Influenza-Unspecified 04/19/2017  . PFIZER(Purple Top)SARS-COV-2 Vaccination 08/03/2019, 08/24/2019  . Pneumococcal Conjugate-13 04/05/2015  . Pneumococcal  Polysaccharide-23 07/10/2009  . Pneumococcal-Unspecified 07/15/2016  . Td 04/06/2009  . Tdap 01/05/2020  . Zoster 07/03/2009  . Zoster Recombinat (Shingrix) 08/07/2017, 05/14/2018    TDAP status: Up to date  Flu Vaccine status: Up to date  Pneumococcal vaccine status: Up to date  Covid-19 vaccine status: Completed vaccines  Qualifies for Shingles Vaccine? Yes   Zostavax completed Yes   Shingrix Completed?: Yes  Screening Tests Health Maintenance  Topic Date Due  . COVID-19 Vaccine (3 - Booster for Pfizer series) 02/21/2020  . DEXA SCAN  09/16/2021  . TETANUS/TDAP  01/04/2030  . INFLUENZA VACCINE  Completed  . Hepatitis C Screening  Completed  . PNA vac Low Risk Adult  Completed    Health Maintenance  Health Maintenance Due  Topic Date Due  . COVID-19 Vaccine (3 - Booster for Pfizer series) 02/21/2020    Colorectal cancer screening: No longer required.   Mammogram status: Completed 09/17/2019. Repeat every year  Bone Density status: Completed 09/17/2019. Results reflect: Bone density results: OSTEOPENIA. Repeat every 2 years.  Lung Cancer Screening: (Low Dose CT Chest recommended if Age 64-80 years, 30 pack-year currently smoking OR have quit w/in 15years.) does not qualify.   Lung Cancer Screening Referral: no  Additional Screening:  Hepatitis C Screening: does qualify; Completed: yes  Vision Screening: Recommended annual ophthalmology exams for early detection of glaucoma and other disorders of the eye. Is the patient up to date with their annual eye exam?  Yes  Who is the provider or what is the name of the office in which the patient attends annual eye exams? [facility] If pt is not established with a provider, would they like to be referred to a provider to establish care? No .   Dental Screening: Recommended annual dental exams  for proper oral hygiene  Community Resource Referral / Chronic Care Management: CRR required this visit?  No   CCM required  this visit?  No      Plan:     I have personally reviewed and noted the following in the patient's chart:   . Medical and social history . Use of alcohol, tobacco or illicit drugs  . Current medications and supplements . Functional ability and status . Nutritional status . Physical activity . Advanced directives . List of other physicians . Hospitalizations, surgeries, and ER visits in previous 12 months . Vitals . Screenings to include cognitive, depression, and falls . Referrals and appointments  In addition, I have reviewed and discussed with patient certain preventive protocols, quality metrics, and best practice recommendations. A written personalized care plan for preventive services as well as general preventive health recommendations were provided to patient.     Sheral Flow, LPN   [id number]   Nurse Notes:  Patient is cogitatively intact. There were no vitals filed for this visit. There is no height or weight on file to calculate BMI.

## 2020-08-30 ENCOUNTER — Other Ambulatory Visit: Payer: Self-pay | Admitting: Internal Medicine

## 2020-09-28 DIAGNOSIS — Z85828 Personal history of other malignant neoplasm of skin: Secondary | ICD-10-CM | POA: Diagnosis not present

## 2020-09-28 DIAGNOSIS — L700 Acne vulgaris: Secondary | ICD-10-CM | POA: Diagnosis not present

## 2020-09-28 DIAGNOSIS — L820 Inflamed seborrheic keratosis: Secondary | ICD-10-CM | POA: Diagnosis not present

## 2020-09-28 DIAGNOSIS — D485 Neoplasm of uncertain behavior of skin: Secondary | ICD-10-CM | POA: Diagnosis not present

## 2020-09-28 DIAGNOSIS — L719 Rosacea, unspecified: Secondary | ICD-10-CM | POA: Diagnosis not present

## 2020-09-29 ENCOUNTER — Other Ambulatory Visit: Payer: Self-pay | Admitting: Family

## 2020-10-01 IMAGING — MR MRI HEAD WITHOUT CONTRAST
10 of 11 series · 43 of 48 positions shown · non-contrast
Comparison: Prior CT from 02/12/2019

CLINICAL DATA: Initial evaluation for acute onset dizziness, recent
knee surgery.

EXAM:
MRI HEAD WITHOUT CONTRAST
TECHNIQUE: Multiplanar, multiecho pulse sequences of the brain and surrounding
structures were obtained without intravenous contrast.

[Series 5: DWI · axial · 3.0mm · 0.88mm/px · z∈[-98,+42]mm · 10 of 96 slices shown (1 of 4)]
[im 1/96]
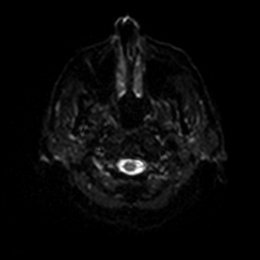
[im 11/96]
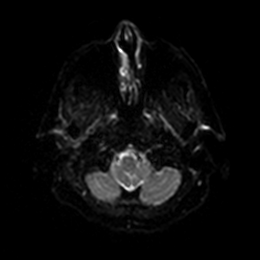
[im 22/96]
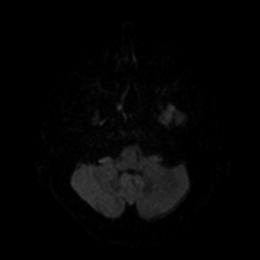
[im 32/96]
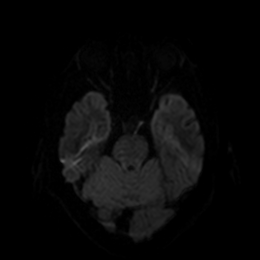
[im 43/96]
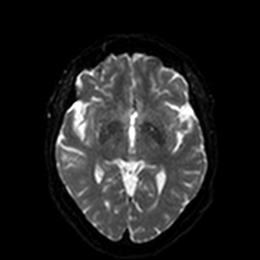
[im 53/96]
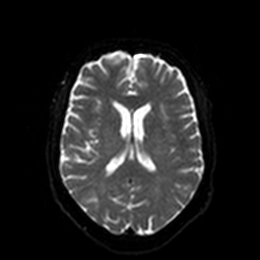
[im 64/96]
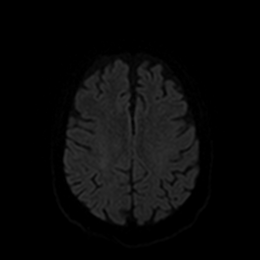
[im 74/96]
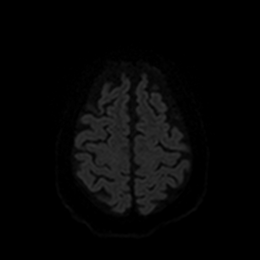
[im 85/96]
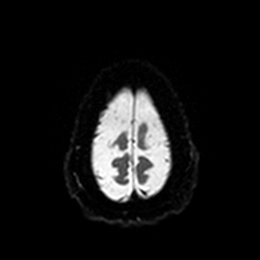
[im 96/96]
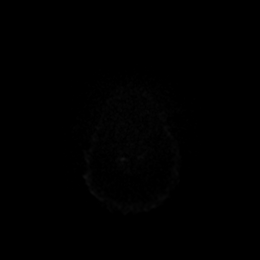

[Series 6: DWI · axial · 3.0mm · 0.88mm/px · z∈[-98,+42]mm · 5 of 48 slices shown (2 of 4)]
[im 1/48]
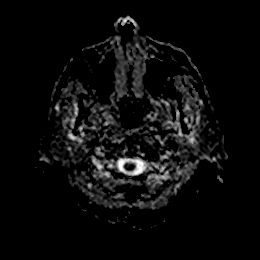
[im 12/48]
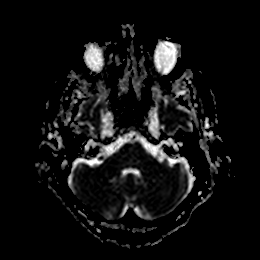
[im 24/48]
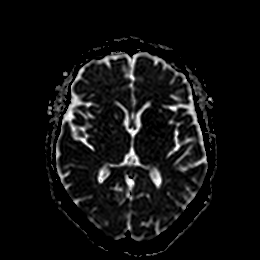
[im 36/48]
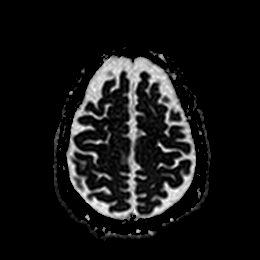
[im 48/48]
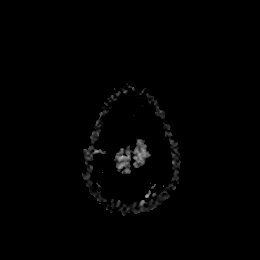

[Series 7: DWI · coronal · 4.0mm · 0.88mm/px · 6 of 68 slices shown (3 of 4)]
[im 1/68]
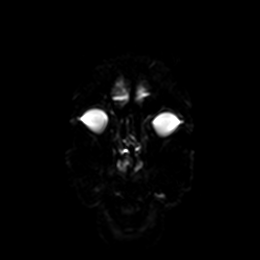
[im 14/68]
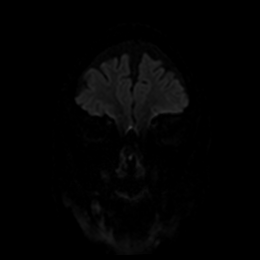
[im 27/68]
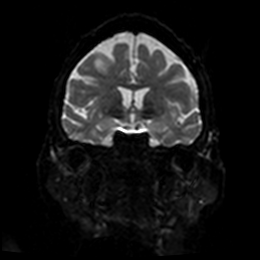
[im 41/68]
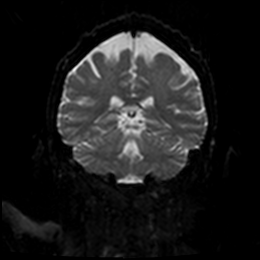
[im 54/68]
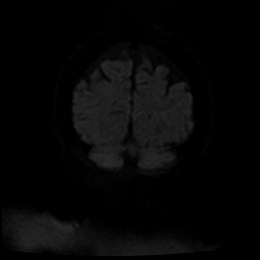
[im 68/68]
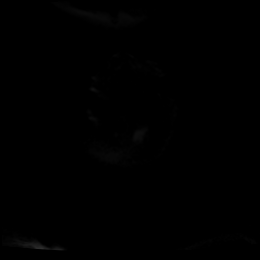

[Series 8: DWI · coronal · 4.0mm · 0.88mm/px · 3 of 34 slices shown (4 of 4)]
[im 1/34]
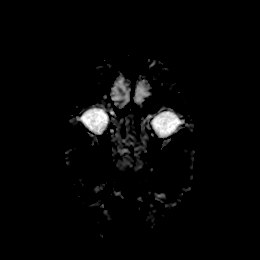
[im 17/34]
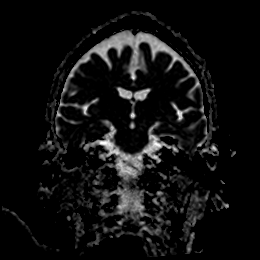
[im 34/34]
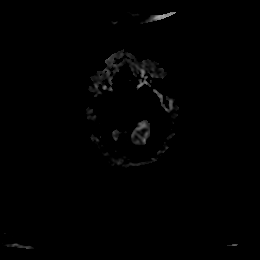

[Series 9: T1 · sagittal · 5.0mm · 0.75mm/px · 2 of 25 slices shown]
[im 1/25]
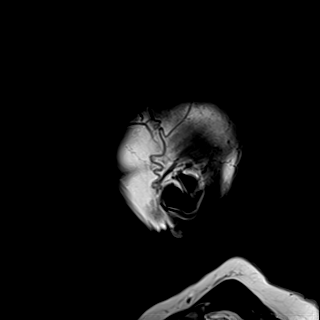
[im 25/25]
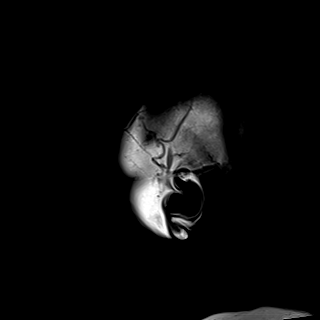

[Series 10: T2 · axial · 5.0mm · 0.72mm/px · z∈[-94,+50]mm · 2 of 25 slices shown (1 of 2)]
[im 1/25]
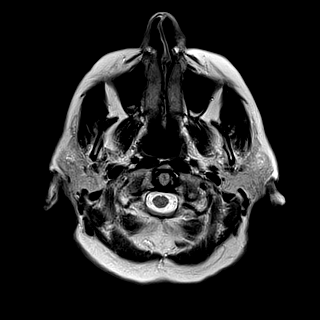
[im 25/25]
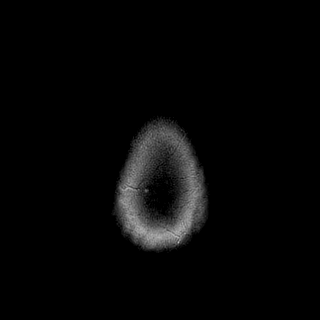

[Series 11: FLAIR · axial · 5.0mm · 0.45mm/px · z∈[-93,+50]mm · 2 of 25 slices shown]
[im 1/25]
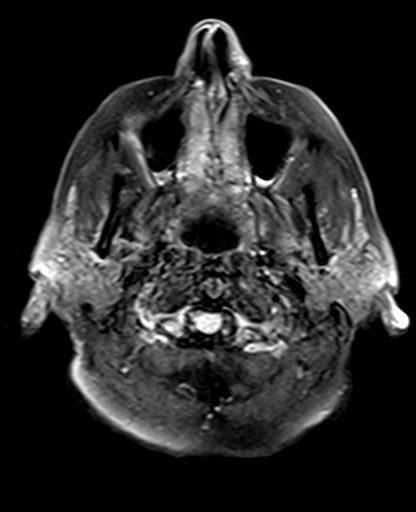
[im 25/25]
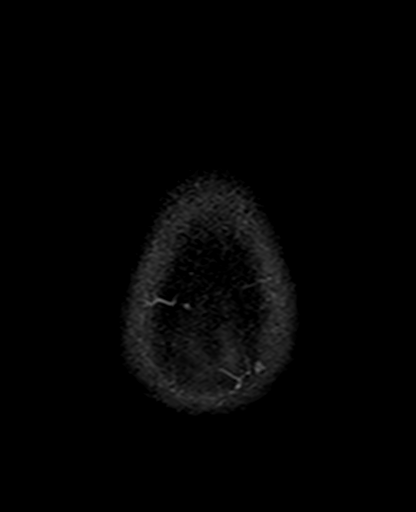

[Series 13: pha_images · axial · 3.0mm · 0.90mm/px · z∈[-105,+71]mm · 5 of 60 slices shown]
[im 1/60]
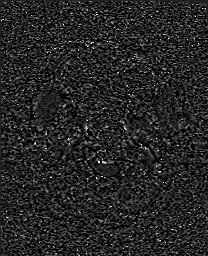
[im 15/60]
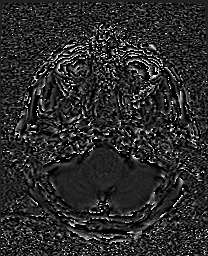
[im 30/60]
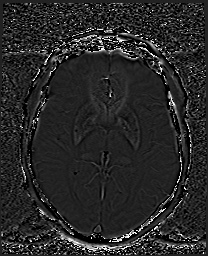
[im 45/60]
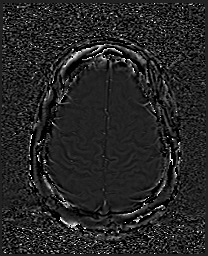
[im 60/60]
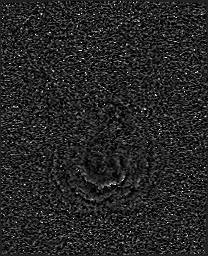

[Series 14: swi_images · axial · 3.0mm · 0.90mm/px · z∈[-105,+71]mm · 5 of 60 slices shown]
[im 1/60]
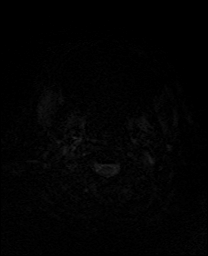
[im 15/60]
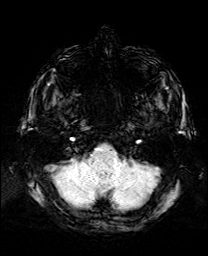
[im 30/60]
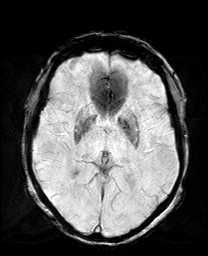
[im 45/60]
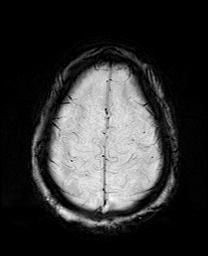
[im 60/60]
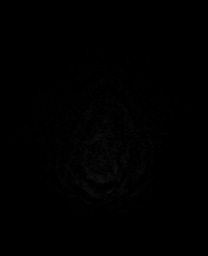

[Series 17: T2 · coronal · 5.0mm · 0.34mm/px · 3 of 30 slices shown (2 of 2)]
[im 1/30]
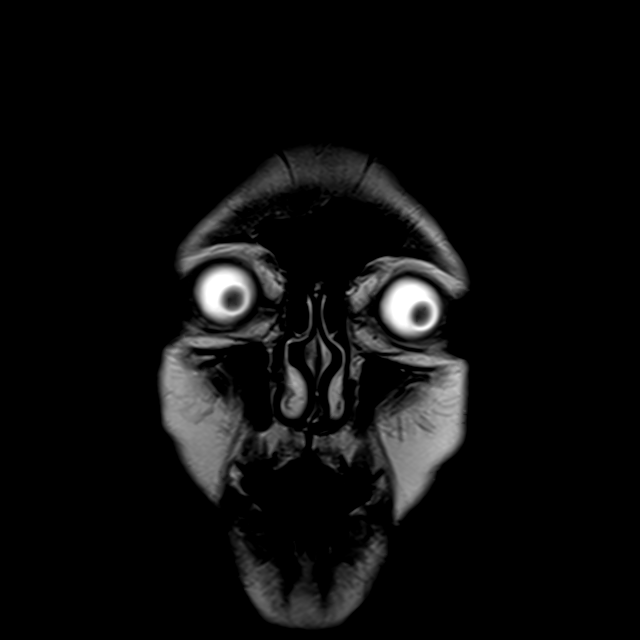
[im 15/30]
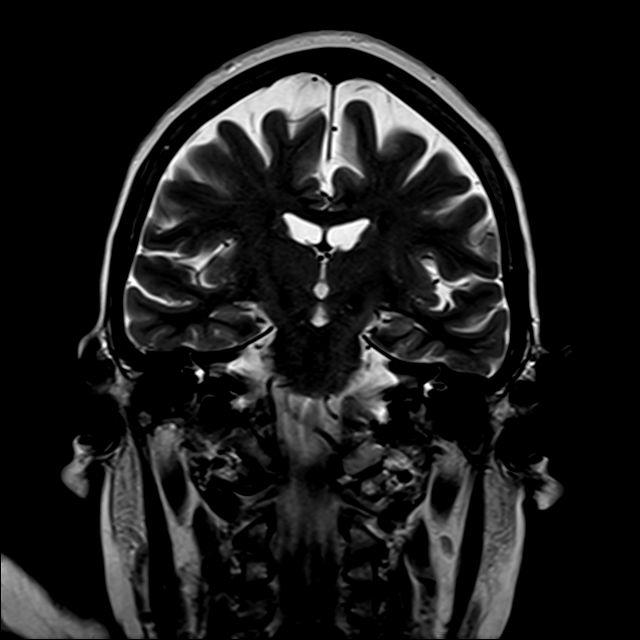
[im 30/30]
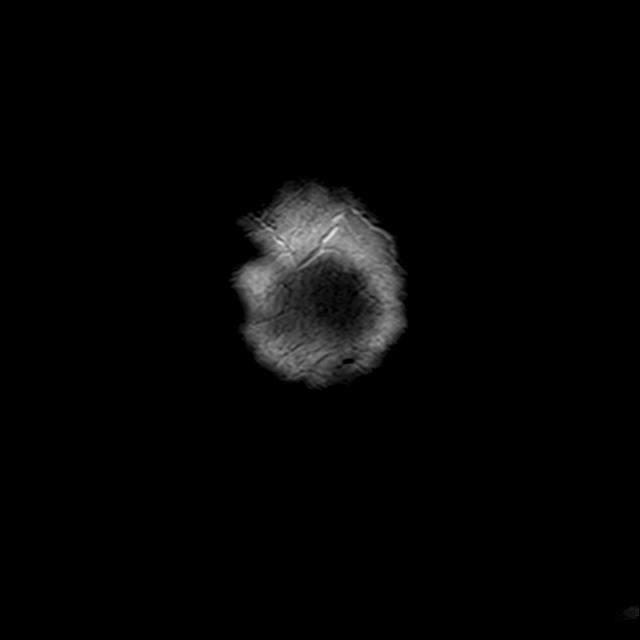

[43 of 48 positions shown; findings below may reference images not displayed]

FINDINGS: Brain: Cerebral volume within normal limits for patient age. No
focal parenchymal signal abnormality identified.

No abnormal foci of restricted diffusion to suggest acute or
subacute ischemia. Gray-white matter differentiation well
maintained. No encephalomalacia to suggest chronic infarction. No
foci of susceptibility artifact to suggest acute or chronic
intracranial hemorrhage.

No mass lesion, midline shift or mass effect. No hydrocephalus. No
extra-axial fluid collection. Major dural sinuses are grossly
patent.

Pituitary gland and suprasellar region are normal. Midline
structures intact and normal.

Vascular: Major intracranial vascular flow voids well maintained and
normal in appearance.

Skull and upper cervical spine: Craniocervical junction normal.
Visualized upper cervical spine within normal limits. Bone marrow
signal intensity normal. No scalp soft tissue abnormality.

Sinuses/Orbits: Globes and orbital soft tissues within normal
limits.

Paranasal sinuses are clear. Trace right mastoid effusion noted, of
doubtful significance. Inner ear structures normal.

Other: None.
IMPRESSION: Normal brain MRI.  No acute intracranial abnormality identified.

## 2020-10-20 ENCOUNTER — Encounter: Payer: Self-pay | Admitting: Internal Medicine

## 2020-10-20 ENCOUNTER — Other Ambulatory Visit: Payer: Self-pay

## 2020-10-20 ENCOUNTER — Ambulatory Visit (INDEPENDENT_AMBULATORY_CARE_PROVIDER_SITE_OTHER): Payer: Medicare HMO | Admitting: Internal Medicine

## 2020-10-20 DIAGNOSIS — M67442 Ganglion, left hand: Secondary | ICD-10-CM

## 2020-10-20 DIAGNOSIS — G4709 Other insomnia: Secondary | ICD-10-CM

## 2020-10-20 DIAGNOSIS — Z6841 Body Mass Index (BMI) 40.0 and over, adult: Secondary | ICD-10-CM | POA: Diagnosis not present

## 2020-10-20 DIAGNOSIS — F32A Depression, unspecified: Secondary | ICD-10-CM | POA: Diagnosis not present

## 2020-10-20 DIAGNOSIS — F419 Anxiety disorder, unspecified: Secondary | ICD-10-CM

## 2020-10-20 MED ORDER — ESCITALOPRAM OXALATE 10 MG PO TABS: 10.0000 mg | ORAL_TABLET | Freq: Every day | ORAL | 5 refills | Status: DC

## 2020-10-20 MED ORDER — ESZOPICLONE 3 MG PO TABS: 3.0000 mg | ORAL_TABLET | Freq: Every day | ORAL | 2 refills | Status: DC

## 2020-10-20 NOTE — Progress Notes (Signed)
Subjective:    Patient ID: Tanya [name], female    DOB: Mar 05, 1946, 75 y.o.   MRN: [id number]  HPI The patient is here for an acute visit.   Left ring finger PIP ? cyst.  Noticed it three weeks.  No trauma to the area.  Maybe a little a larger.  She does have a little pain in the joint, but figured it was related to arthritis since she has a family history.  Insomnia: She has increased her Lunesta to 4 mg nightly and that does help.  I had discussed with her via phone notes and that we could not increase the dose that much because above the maximum dose.  She is experiencing increased anxiety and depression.  Her mother recently died and her sister was recently diagnosed with a brain tumor.  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  She feels like she needs something to help her through this.     Medications and allergies reviewed with patient and updated if appropriate.  Patient Active Problem List   Diagnosis Date Noted  . Anxiety and depression 06/19/2020  . Hypertensive retinopathy 06/15/2020  . Insomnia 12/17/2019  . Vertigo 02/22/2019  . Pain in left knee 01/25/2019  . Dysphagia 09/15/2018  . Herpes zoster without complication 34/74/2595  . 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/29/2015  . Essential tremor 12/14/2013  . Osteopenia 06/11/2013  . OA (osteoarthritis) of knee 02/24/2012  . Prediabetes 04/06/2009  . NONTOXIC MULTINODULAR GOITER 03/03/2008  . Hyperlipidemia 12/02/2007  . Allergic rhinitis 12/02/2007  . Personal history of malignant neoplasm of breast 12/02/2007  . History of skin cancer 12/02/2007  . Vitamin D deficiency 12/02/2007    Current Outpatient Medications on File Prior to Visit  Medication Sig Dispense Refill  . azelastine (ASTELIN) 0.1 % nasal spray Place 1 spray into both nostrils 2 (two) times daily. Use in each nostril as directed    . Biotin 10 MG TABS Take 1 tablet by mouth daily.     . Calcium Carb-Cholecalciferol (CALCIUM/VITAMIN D) 500-200 MG-UNIT TABS Take 1 tablet by mouth daily.    . cetirizine (ZYRTEC) 10 MG tablet TAKE 1 TABLET (10 MG TOTAL) BY MOUTH DAILY. KEEP SCHEDULED APPT FOR FUTURE REFILLS 90 tablet 0  . dimenhyDRINATE (DRAMAMINE) 50 MG tablet Take 50 mg by mouth every 8 (eight) hours as needed for dizziness.    Marland Kitchen ELDERBERRY PO Take 1 capsule by mouth daily.    . fluticasone (FLONASE) 50 MCG/ACT nasal spray Place 1 spray into the nose daily.     Marland Kitchen ipratropium (ATROVENT) 0.06 % nasal spray Place 1 spray into both nostrils 2 (two) times a day.     . levocetirizine (XYZAL) 5 MG tablet TAKE 1 TABLET BY MOUTH EVERY DAY IN THE EVENING 90 tablet 2  . meclizine (ANTIVERT) 12.5 MG tablet Take 1 tablet (12.5 mg total) by mouth 3 (three) times daily as needed for dizziness. 15 tablet 0  . metroNIDAZOLE (METROCREAM) 0.75 % cream     . Multiple Vitamin (MULTIVITAMIN) tablet Take 1 tablet by mouth daily.    . simvastatin (ZOCOR) 20 MG tablet Take 1 tablet by mouth daily in the evening. 90 tablet 3   No current facility-administered medications on file prior to visit.    Past Medical History:  Diagnosis Date  . Arthritis   . Bursitis 2014   Left; Dr Maureen Ralphs  . Cancer ([place])  1986   breast cancer L  . Hyperlipidemia   . Lymphedema 2000   LUE  . perennial allergies   . PONV (postoperative nausea and vomiting)   . Skin cancer    basil cell    Past Surgical History:  Procedure Laterality Date  . ABDOMINAL HYSTERECTOMY  1990   & BSO for fibroids  . ANKLE FRACTURE SURGERY  2006  . BREAST RECONSTRUCTION  1986  . BREAST SURGERY  1986   double mastectonmy fror cancer on L  . COLONOSCOPY  04/14/2006   Tics ; Dr Earlean Shawl  . KNEE ARTHROSCOPY  2001 & 2011   L & R  . TOTAL KNEE ARTHROPLASTY  02/24/2012   Procedure: TOTAL KNEE ARTHROPLASTY;  Surgeon: Gearlean Alf, MD;  Location: [facility];  Service: Orthopedics;  Laterality: Right;  . TOTAL KNEE ARTHROPLASTY Left  02/01/2019   Procedure: TOTAL KNEE ARTHROPLASTY;  Surgeon: Gaynelle Arabian, MD;  Location: [facility];  Service: Orthopedics;  Laterality: Left;  53min  . WISDOM TOOTH EXTRACTION      Social History   Socioeconomic History  . Marital status: Married    Spouse name: Not on file  . Number of children: Not on file  . Years of education: Not on file  . Highest education level: Not on file  Occupational History  . Not on file  Tobacco Use  . Smoking status: Never Smoker  . Smokeless tobacco: Never Used  Vaping Use  . Vaping Use: Never used  Substance and Sexual Activity  . Alcohol use: Yes    Comment:  very rarely, 1-2 per month  . Drug use: No  . Sexual activity: Not Currently  Other Topics Concern  . Not on file  Social History Narrative  . Not on file   Social Determinants of Health   Financial Resource Strain: Low Risk   . Difficulty of Paying Living Expenses: Not hard at all  Food Insecurity: No Food Insecurity  . Worried About Charity fundraiser in the Last Year: Never true  . Ran Out of Food in the Last Year: Never true  Transportation Needs: No Transportation Needs  . Lack of Transportation (Medical): No  . Lack of Transportation (Non-Medical): No  Physical Activity: Sufficiently Active  . Days of Exercise per Week: 5 days  . Minutes of Exercise per Session: 30 min  Stress: No Stress Concern Present  . Feeling of Stress : Not at all  Social Connections: Socially Integrated  . Frequency of Communication with Friends and Family: More than three times a week  . Frequency of Social Gatherings with Friends and Family: More than three times a week  . Attends Religious Services: More than 4 times per year  . Active Member of Clubs or Organizations: Yes  . Attends Archivist Meetings: More than 4 times per year  . Marital Status: Married    Family History  Problem Relation Age of Onset  . Hypertension Mother   . Heart disease Mother        pacer  . Colon  polyps Mother        pre cancerous  . Glaucoma Mother   . Dementia Mother   . Heart attack Maternal Grandmother 94  . Hypertension Maternal Grandmother   . Stroke Maternal Grandfather 25  . Hypertension Maternal Grandfather   . Heart attack Maternal Uncle 65  . Cancer Neg Hx   . Colon cancer Neg Hx   . Esophageal cancer Neg Hx   .  Rectal cancer Neg Hx   . Stomach cancer Neg Hx   . Pancreatic cancer Neg Hx     Review of Systems  Constitutional: Negative for fever.  Respiratory: Negative for shortness of breath.   Cardiovascular: Negative for chest pain and palpitations.  Skin: Negative for color change and wound.       Cyst on finger  Psychiatric/Behavioral: Positive for dysphoric mood and sleep disturbance. The patient is nervous/anxious.        Objective:   Vitals:   10/20/20 1324  BP: 140/70  Pulse: 72  Temp: 98.7 F (37.1 C)  SpO2: 99%   BP Readings from Last 3 Encounters:  10/20/20 140/70  06/19/20 (!) 148/82  12/17/19 (!) 140/92   Wt Readings from Last 3 Encounters:  10/20/20 240 lb (108.9 kg)  06/19/20 235 lb (106.6 kg)  12/17/19 231 lb (104.8 kg)   Body mass index is 41.2 kg/m.   Physical Exam Constitutional:      General: She is not in acute distress.    Appearance: Normal appearance. She is not ill-appearing.  Skin:    General: Skin is warm and dry.     Findings: Lesion (Left ring finger PIP joint there is a small mobile, nontender cyst likely ganglion cyst) present. No erythema or rash.  Neurological:     Mental Status: She is alert.  Psychiatric:        Mood and Affect: Mood normal.        Behavior: Behavior normal.        Thought Content: Thought content normal.        Judgment: Judgment normal.            Assessment & Plan:    See Problem List for Assessment and Plan of chronic medical problems.    This visit occurred during the SARS-CoV-2 public health emergency.  Safety protocols were in place, including screening questions  prior to the visit, additional usage of staff PPE, and extensive cleaning of exam room while observing appropriate contact time as indicated for disinfecting solutions.

## 2020-10-20 NOTE — Patient Instructions (Signed)
Medications changes include :  Increase lunesta 3 mg nightly.  Start lexparo 10 mg daily.     Your prescription(s) have been submitted to your pharmacy. Please take as directed and contact our office if you believe you are having problem(s) with the medication(s).     https://www.foothealthfacts.org/conditions/ganglion-cyst"> https://www.clinicalkey.com">  Ganglion Cyst  A ganglion cyst is a non-cancerous, fluid-filled lump of tissue that occurs near a joint, tendon, or ligament. The cyst grows out of a joint or the lining of a tendon or ligament. Ganglion cysts most often develop in the hand or wrist, but they can also develop in the shoulder, elbow, hip, knee, ankle, or foot. Ganglion cysts are ball-shaped or egg-shaped. Their size can range from the size of a pea to larger than a grape. Increased activity may cause the cyst to get bigger because more fluid starts to build up. What are the causes? The exact cause of this condition is not known, but it may be related to:  Inflammation or irritation around the joint.  An injury or tear in the layers of tissue around the joint (joint capsule).  Repetitive movements or overuse.  History of acute or repeated injury. What increases the risk? You are more likely to develop this condition if:  You are a female.  You are 53-17 years old. What are the signs or symptoms? The main symptom of this condition is a lump. It most often appears on the hand or wrist. In many cases, there are no other symptoms, but a cyst can sometimes cause:  Tingling.  Pain or tenderness.  Numbness.  Weakness or loss of strength in the affected joint.  Decreased range of motion in the affected area of the body.   How is this diagnosed? Ganglion cysts are usually diagnosed based on a physical exam. Your health care provider will feel the lump and may shine a light next to it. If it is a ganglion cyst, the light will likely shine through it. Your health  care provider may order an X-ray, ultrasound, MRI, or CT scan to rule out other conditions. How is this treated? Ganglion cysts often go away on their own without treatment. If you have pain or other symptoms, treatment may be needed. Treatment is also needed if the ganglion cyst limits your movement or if it gets infected. Treatment may include:  Wearing a brace or splint on your wrist or finger.  Taking anti-inflammatory medicine.  Having fluid drained from the lump with a needle (aspiration).  Getting an injection of medicine into the joint to decrease inflammation. This may be corticosteroids, ethanol, or hyaluronidase.  Having surgery to remove the ganglion cyst.  Placing a pad in your shoe or wearing shoes that will not rub against the cyst if it is on your foot. Follow these instructions at home:  Do not press on the ganglion cyst, poke it with a needle, or hit it.  Take over-the-counter and prescription medicines only as told by your health care provider.  If you have a brace or splint: ? Wear it as told by your health care provider. ? Remove it as told by your health care provider. Ask if you need to remove it when you take a shower or a bath.  Watch your ganglion cyst for any changes.  Keep all follow-up visits as told by your health care provider. This is important. Contact a health care provider if:  Your ganglion cyst becomes larger or more painful.  You have pus  coming from the lump.  You have weakness or numbness in the affected area.  You have a fever or chills. Get help right away if:  You have a fever and have any of these in the cyst area: ? Increased redness. ? Red streaks. ? Swelling. Summary  A ganglion cyst is a non-cancerous, fluid-filled lump that occurs near a joint, tendon, or ligament.  Ganglion cysts most often develop in the hand or wrist, but they can also develop in the shoulder, elbow, hip, knee, ankle, or foot.  Ganglion cysts often  go away on their own without treatment. This information is not intended to replace advice given to you by your health care provider. Make sure you discuss any questions you have with your health care provider. Document Revised: 09/22/2019 Document Reviewed: 09/22/2019 Elsevier Patient Education  [place].

## 2020-10-20 NOTE — Assessment & Plan Note (Addendum)
Acute on chronic She has been dealing with some depression and anxiety, but is increased now with everything going on She finds her self both anxious and depressed and less patient She would like to start medication to help with this Start lexapro 10 mg daily

## 2020-10-20 NOTE — Assessment & Plan Note (Signed)
Acute Cyst on her left index finger likely a ganglion cyst Is at the PIP joint-it is nontender Discussed options of monitoring it, removing fluid with a needle and removal by surgery At this point she would just like to monitor Reassured that this is completely benign

## 2020-10-20 NOTE — Assessment & Plan Note (Signed)
Chronic We will continue Lunesta and increase it to 3 mg nightly-discussed that this is the maximum dose Can try adding in melatonin even though it did not work by itself it may help Discussed with her that I also think treating the anxiety and depression may help

## 2020-11-03 ENCOUNTER — Encounter: Payer: Self-pay | Admitting: Internal Medicine

## 2020-11-11 ENCOUNTER — Other Ambulatory Visit: Payer: Self-pay | Admitting: Internal Medicine

## 2020-12-07 ENCOUNTER — Telehealth: Payer: Self-pay | Admitting: Pharmacist

## 2020-12-07 NOTE — Chronic Care Management (AMB) (Signed)
    Chronic Care Management Pharmacy Assistant   Name: Tanya [name]  MRN: [id number] DOB: February 11, 1946   Reason for Encounter: Disease State General Call    Recent office visits:  10/20/20 Dr. Quay Burow changed eszopiclone from 2mg  to 3 mg daily and ordered escitalopram 10 mg  Recent consult visits:  None ID  Hospital visits:  None in previous 6 months  Medications: Outpatient Encounter Medications as of 12/07/2020  Medication Sig  . azelastine (ASTELIN) 0.1 % nasal spray Place 1 spray into both nostrils 2 (two) times daily. Use in each nostril as directed  . Biotin 10 MG TABS Take 1 tablet by mouth daily.  . Calcium Carb-Cholecalciferol (CALCIUM/VITAMIN D) 500-200 MG-UNIT TABS Take 1 tablet by mouth daily.  . cetirizine (ZYRTEC) 10 MG tablet TAKE 1 TABLET (10 MG TOTAL) BY MOUTH DAILY. KEEP SCHEDULED APPT FOR FUTURE REFILLS  . dimenhyDRINATE (DRAMAMINE) 50 MG tablet Take 50 mg by mouth every 8 (eight) hours as needed for dizziness.  Marland Kitchen ELDERBERRY PO Take 1 capsule by mouth daily.  Marland Kitchen escitalopram (LEXAPRO) 10 MG tablet TAKE 1 TABLET BY MOUTH EVERY DAY  . Eszopiclone 3 MG TABS Take 1 tablet (3 mg total) by mouth at bedtime. Take immediately before bedtime  . fluticasone (FLONASE) 50 MCG/ACT nasal spray Place 1 spray into the nose daily.   Marland Kitchen ipratropium (ATROVENT) 0.06 % nasal spray Place 1 spray into both nostrils 2 (two) times a day.   . levocetirizine (XYZAL) 5 MG tablet TAKE 1 TABLET BY MOUTH EVERY DAY IN THE EVENING  . meclizine (ANTIVERT) 12.5 MG tablet Take 1 tablet (12.5 mg total) by mouth 3 (three) times daily as needed for dizziness.  . metroNIDAZOLE (METROCREAM) 0.75 % cream   . Multiple Vitamin (MULTIVITAMIN) tablet Take 1 tablet by mouth daily.  . simvastatin (ZOCOR) 20 MG tablet Take 1 tablet by mouth daily in the evening.   No facility-administered encounter medications on file as of 12/07/2020.    Have you had any problems recently with your health? Patient states that she  does not have any new issues at this time  Have you had any problems with your pharmacy? Patient states that she does not have any problems with getting medications or the cost of medications from the pharmacy  What issues or side effects are you having with your medications? Patient states that she does not have any side effects from any medications  What would you like me to pass along to [facility] for them to help you with?  Patient states she is doing well and that she does not have any concerns about her health or medications at this time  What can we do to take care of you better? Patient states that if any changes in health she will call Dr. Quay Burow office   Star Rating Drugs: Simvastatin 09/16/20 90 d  [place] Pharmacist Assistant [phone]  Time spent:20

## 2020-12-17 ENCOUNTER — Other Ambulatory Visit: Payer: Self-pay | Admitting: Internal Medicine

## 2020-12-17 DIAGNOSIS — E7849 Other hyperlipidemia: Secondary | ICD-10-CM

## 2020-12-18 ENCOUNTER — Other Ambulatory Visit: Payer: Self-pay | Admitting: Internal Medicine

## 2021-01-25 DIAGNOSIS — H35363 Drusen (degenerative) of macula, bilateral: Secondary | ICD-10-CM | POA: Diagnosis not present

## 2021-01-25 DIAGNOSIS — H04123 Dry eye syndrome of bilateral lacrimal glands: Secondary | ICD-10-CM | POA: Diagnosis not present

## 2021-01-25 DIAGNOSIS — H5319 Other subjective visual disturbances: Secondary | ICD-10-CM | POA: Diagnosis not present

## 2021-01-25 DIAGNOSIS — H35033 Hypertensive retinopathy, bilateral: Secondary | ICD-10-CM | POA: Diagnosis not present

## 2021-02-01 ENCOUNTER — Other Ambulatory Visit: Payer: Self-pay | Admitting: Internal Medicine

## 2021-02-05 ENCOUNTER — Telehealth: Payer: Self-pay | Admitting: Pharmacist

## 2021-02-05 NOTE — Progress Notes (Signed)
    Chronic Care Management Pharmacy Assistant   Name: Tanya [name]  MRN: [id number] DOB: April 11, 1946   Reason for Encounter: Disease State - General Adherence    Recent office visits:  None noted.  Recent consult visits:  None noted.  Hospital visits:  None in previous 6 months  Medications: Outpatient Encounter Medications as of 02/05/2021  Medication Sig   Eszopiclone 3 MG TABS TAKE 1 TABLET (3 MG TOTAL) BY MOUTH AT BEDTIME. TAKE IMMEDIATELY BEFORE BEDTIME   azelastine (ASTELIN) 0.1 % nasal spray Place 1 spray into both nostrils 2 (two) times daily. Use in each nostril as directed   Biotin 10 MG TABS Take 1 tablet by mouth daily.   Calcium Carb-Cholecalciferol (CALCIUM/VITAMIN D) 500-200 MG-UNIT TABS Take 1 tablet by mouth daily.   cetirizine (ZYRTEC) 10 MG tablet TAKE 1 TABLET (10 MG TOTAL) BY MOUTH DAILY. KEEP SCHEDULED APPT FOR FUTURE REFILLS   dimenhyDRINATE (DRAMAMINE) 50 MG tablet Take 50 mg by mouth every 8 (eight) hours as needed for dizziness.   ELDERBERRY PO Take 1 capsule by mouth daily.   escitalopram (LEXAPRO) 10 MG tablet TAKE 1 TABLET BY MOUTH EVERY DAY   fluticasone (FLONASE) 50 MCG/ACT nasal spray Place 1 spray into the nose daily.    ipratropium (ATROVENT) 0.06 % nasal spray Place 1 spray into both nostrils 2 (two) times a day.    levocetirizine (XYZAL) 5 MG tablet TAKE 1 TABLET BY MOUTH EVERY DAY IN THE EVENING   meclizine (ANTIVERT) 12.5 MG tablet Take 1 tablet (12.5 mg total) by mouth 3 (three) times daily as needed for dizziness.   metroNIDAZOLE (METROCREAM) 0.75 % cream    Multiple Vitamin (MULTIVITAMIN) tablet Take 1 tablet by mouth daily.   simvastatin (ZOCOR) 20 MG tablet TAKE 1 TABLET BY MOUTH DAILY IN THE EVENING.   No facility-administered encounter medications on file as of 02/05/2021.    Have you had any problems recently with your health? Patient denies any current concerns about her health.  Have you had any problems with your  pharmacy? Patient denies any problems with her pharmacy.   What issues or side effects are you having with your medications? Patient denies ay current side effects with her medications.   What would you like me to pass along to [facility], CPP for them to help you with?  Patient would like to discuss weaning off her Escitalopram. She feels she can do without it and would like instructions on how to safely do this.   What can we do to take care of you better? Patient has no recommendations at this time and is pleased with her current level of care.   Star Rating Drugs: Simvastatin 20 mg - last filled 12/18/20 90 days   [name], CCMA Clinical Pharmacist Assistant  [phone]  Time Spent: 20 minutes

## 2021-02-13 ENCOUNTER — Other Ambulatory Visit: Payer: Self-pay

## 2021-02-13 ENCOUNTER — Ambulatory Visit (INDEPENDENT_AMBULATORY_CARE_PROVIDER_SITE_OTHER): Payer: Medicare HMO | Admitting: Pharmacist

## 2021-02-13 DIAGNOSIS — F419 Anxiety disorder, unspecified: Secondary | ICD-10-CM

## 2021-02-13 DIAGNOSIS — G4709 Other insomnia: Secondary | ICD-10-CM

## 2021-02-13 DIAGNOSIS — E7849 Other hyperlipidemia: Secondary | ICD-10-CM

## 2021-02-13 DIAGNOSIS — F32A Depression, unspecified: Secondary | ICD-10-CM | POA: Diagnosis not present

## 2021-02-13 NOTE — Progress Notes (Signed)
Chronic Care Management Pharmacy Note  02/13/2021 Name:  Tanya [name] MRN:  [id number] DOB:  [birth year]  Summary: -Pt reports mood and sleep are much better; she is interested in coming off Lexapro but worried about what will happen if she does  Recommendations/Changes made from today's visit: -Advised to continue SSRI for at least 6 months after symptom improvement to ensure full resolution/remission   Subjective: Tanya FLOREN is an 75 y.o. year old female who is a primary patient of Burns, Claudina Lick, MD.  The CCM team was consulted for assistance with disease management and care coordination needs.    Engaged with patient by telephone for follow up visit in response to provider referral for pharmacy case management and/or care coordination services.   Consent to Services:  The patient was given information about Chronic Care Management services, agreed to services, and gave verbal consent prior to initiation of services.  Please see initial visit note for detailed documentation.   Patient Care Team: Binnie Rail, MD as PCP - General (Internal Medicine) Charlton Haws, [facility] as Pharmacist (Pharmacist) Monna Fam, MD as Consulting Physician (Ophthalmology)  Recent office visits: 10/20/20 Dr Quay Burow OV: c/o worsening anxiety/depression. Start escitalopram 10 mg  Recent consult visits: [facility] visits: None in previous 6 months   Objective:  Lab Results  Component Value Date   CREATININE 0.81 12/17/2019   BUN 20 12/17/2019   GFR 68.70 12/17/2019   GFRNONAA >60 02/12/2019   GFRAA >60 02/12/2019   NA 140 12/17/2019   K 4.0 12/17/2019   CALCIUM 9.4 12/17/2019   CO2 31 12/17/2019   GLUCOSE 117 (H) 12/17/2019    Lab Results  Component Value Date/Time   HGBA1C 5.4 01/28/2019 02:00 PM   HGBA1C 5.5 09/15/2018 09:04 AM   GFR 68.70 12/17/2019 12:05 PM   GFR 73.08 09/15/2018 09:04 AM    Last diabetic Eye exam: No results found for: HMDIABEYEEXA  Last  diabetic Foot exam: No results found for: HMDIABFOOTEX   Lab Results  Component Value Date   CHOL 196 12/17/2019   HDL 69.00 12/17/2019   LDLCALC 101 (H) 12/17/2019   LDLDIRECT 148.0 07/10/2010   TRIG 130.0 12/17/2019   CHOLHDL 3 12/17/2019    Hepatic Function Latest Ref Rng & Units 12/17/2019 02/12/2019 01/28/2019  Total Protein 6.0 - 8.3 g/dL 7.2 6.1(L) 7.5  Albumin 3.5 - 5.2 g/dL 4.4 3.2(L) 4.4  AST 0 - 37 U/L '17 22 21  ' ALT 0 - 35 U/L '14 18 20  ' Alk Phosphatase 39 - 117 U/L 74 73 59  Total Bilirubin 0.2 - 1.2 mg/dL 0.4 0.6 0.6  Bilirubin, Direct 0.0 - 0.3 mg/dL - - -    Lab Results  Component Value Date/Time   TSH 1.35 12/17/2019 12:05 PM   TSH 3.94 09/15/2018 09:04 AM   FREET4 0.92 09/05/2017 09:24 AM   FREET4 0.7 04/06/2009 12:09 PM    CBC Latest Ref Rng & Units 12/17/2019 02/12/2019 02/02/2019  WBC 4.0 - 10.5 K/uL 8.8 8.9 12.7(H)  Hemoglobin 12.0 - 15.0 g/dL 14.2 12.0 12.3  Hematocrit 36.0 - 46.0 % 42.8 37.6 38.1  Platelets 150.0 - 400.0 K/uL 345.0 535(H) 275    Lab Results  Component Value Date/Time   VD25OH 46.78 12/17/2019 12:05 PM   VD25OH 47.56 09/05/2017 09:24 AM    Clinical ASCVD: No  The 10-year ASCVD risk score Mikey Bussing DC Jr., et al., 2013) is: 23.5%   Values used to calculate the score:  Age: 52 years     Sex: Female     Is Non-Hispanic African American: No     Diabetic: No     Tobacco smoker: No     Systolic Blood Pressure: 882 mmHg     Is BP treated: No     HDL Cholesterol: 69 mg/dL     Total Cholesterol: 196 mg/dL    Depression screen [facility] 2/9 08/23/2020 12/17/2019 09/15/2018  Decreased Interest 0 0 0  Down, Depressed, Hopeless 0 0 0  PHQ - 2 Score 0 0 0      Social History   Tobacco Use  Smoking Status Never  Smokeless Tobacco Never   BP Readings from Last 3 Encounters:  10/20/20 140/70  06/19/20 (!) 148/82  12/17/19 (!) 140/92   Pulse Readings from Last 3 Encounters:  10/20/20 72  06/19/20 71  12/17/19 86   Wt Readings from Last 3  Encounters:  10/20/20 240 lb (108.9 kg)  06/19/20 235 lb (106.6 kg)  12/17/19 231 lb (104.8 kg)   BMI Readings from Last 3 Encounters:  10/20/20 41.20 kg/m  06/19/20 40.34 kg/m  12/17/19 39.65 kg/m    Assessment/Interventions: Review of patient past medical history, allergies, medications, health status, including review of consultants reports, laboratory and other test data, was performed as part of comprehensive evaluation and provision of chronic care management services.   SDOH:  (Social Determinants of Health) assessments and interventions performed: Yes  SDOH Screenings   Alcohol Screen: Low Risk    Last Alcohol Screening Score (AUDIT): 1  Depression (PHQ2-9): Low Risk    PHQ-2 Score: 0  Financial Resource Strain: Low Risk    Difficulty of Paying Living Expenses: Not hard at all  Food Insecurity: No Food Insecurity   Worried About Charity fundraiser in the Last Year: Never true   Ran Out of Food in the Last Year: Never true  Housing: Low Risk    Last Housing Risk Score: 0  Physical Activity: Sufficiently Active   Days of Exercise per Week: 5 days   Minutes of Exercise per Session: 30 min  Social Connections: Engineer, building services of Communication with Friends and Family: More than three times a week   Frequency of Social Gatherings with Friends and Family: More than three times a week   Attends Religious Services: More than 4 times per year   Active Member of [employer] or Organizations: Yes   Attends Music therapist: More than 4 times per year   Marital Status: Married  Stress: No Stress Concern Present   Feeling of Stress : Not at all  Tobacco Use: Low Risk    Smoking Tobacco Use: Never   Smokeless Tobacco Use: Never  Transportation Needs: No Transportation Needs   Lack of Transportation (Medical): No   Lack of Transportation (Non-Medical): No    CCM Care Plan  Allergies  Allergen Reactions   Hydromorphone     whelps on skin     Methocarbamol     Whelps on skin    Oxycodone Hives    Blister on back   Prednisone     Reaction=wired per patient    Medications Reviewed Today     Reviewed by Marcina Millard, CMA (Certified Medical Assistant) on 10/20/20 at 1326  Med List Status: <None>   Medication Order Taking? Sig Documenting Provider Last Dose Status Informant  azelastine (ASTELIN) 0.1 % nasal spray [id number] Yes Place 1 spray into both nostrils 2 (two)  times daily. Use in each nostril as directed [provider] Taking Active Self  Biotin 10 MG TABS [id number] Yes Take 1 tablet by mouth daily. [provider] Taking Active Self  Calcium Carb-Cholecalciferol (CALCIUM/VITAMIN D) 500-200 MG-UNIT TABS [id number] Yes Take 1 tablet by mouth daily. [provider] Taking Active Self  cetirizine (ZYRTEC) 10 MG tablet [id number] Yes TAKE 1 TABLET (10 MG TOTAL) BY MOUTH DAILY. KEEP SCHEDULED APPT FOR FUTURE REFILLS Binnie Rail, MD Taking Active   dimenhyDRINATE (DRAMAMINE) 50 MG tablet [id number] Yes Take 50 mg by mouth every 8 (eight) hours as needed for dizziness. [provider] Taking Active Self  ELDERBERRY PO [id number] Yes Take 1 capsule by mouth daily. [provider] Taking Active   eszopiclone (LUNESTA) 2 MG TABS tablet [id number] Yes TAKE 1 TABLET (2 MG TOTAL) BY MOUTH AT BEDTIME AS NEEDED FOR SLEEP. TAKE IMMEDIATELY BEFORE BEDTIME Burns, Claudina Lick, MD Taking Active   fluticasone [facility]) 50 MCG/ACT nasal spray [id number] Yes Place 1 spray into the nose daily.  [provider] Taking Active Self  ipratropium (ATROVENT) 0.06 % nasal spray [id number] Yes Place 1 spray into both nostrils 2 (two) times a day.  [provider] Taking Active Self  levocetirizine (XYZAL) 5 MG tablet [id number] Yes TAKE 1 TABLET BY MOUTH EVERY DAY IN THE EVENING Burns, Claudina Lick, MD Taking Active   meclizine (ANTIVERT) 12.5 MG tablet [id number] Yes Take 1 tablet (12.5 mg total) by mouth 3  (three) times daily as needed for dizziness. Lajean Saver, MD Taking Active   metroNIDAZOLE (METROCREAM) 0.75 % cream [id number] Yes  [provider] Taking Active   mirtazapine (REMERON) 15 MG tablet [id number] Yes Take 1 tablet (15 mg total) by mouth at bedtime. Binnie Rail, MD Taking Active   Multiple Vitamin (MULTIVITAMIN) tablet [id number] Yes Take 1 tablet by mouth daily. [provider] Taking Active Self  simvastatin (ZOCOR) 20 MG tablet [id number] Yes Take 1 tablet by mouth daily in the evening. 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   Essential tremor 12/14/2013   Osteopenia 06/11/2013   OA (osteoarthritis) of knee 02/24/2012   Prediabetes 04/06/2009   NONTOXIC MULTINODULAR GOITER 03/03/2008   Hyperlipidemia 12/02/2007   Allergic rhinitis 12/02/2007   Personal history of malignant neoplasm of breast 12/02/2007   History of skin cancer 12/02/2007   Vitamin D deficiency 12/02/2007    Immunization History  Administered Date(s) Administered   Fluad Quad(high Dose 65+) 06/19/2020   Influenza Whole 04/06/2009, 06/15/2011   Influenza, High Dose Seasonal PF 04/29/2014, 05/03/2018, 05/17/2019   Influenza,inj,quad, With Preservative 05/15/2017, 05/03/2018   Influenza-Unspecified 04/19/2017   PFIZER(Purple Top)SARS-COV-2 Vaccination 08/03/2019, 08/24/2019   Pneumococcal Conjugate-13 04/05/2015   Pneumococcal Polysaccharide-23 07/10/2009   Pneumococcal-Unspecified 07/15/2016   Td 04/06/2009   Tdap 01/05/2020   Zoster Recombinat (Shingrix) 08/07/2017, 05/14/2018   Zoster, Live 07/03/2009     Conditions to be addressed/monitored:  Hyperlipidemia, Depression, and Anxiety  Care Plan : [place]  Updates made by Charlton Haws, [place] since 02/13/2021 12:00 AM  Problem: Hyperlipidemia, Depression, and Anxiety   Priority: High     Long-Range Goal: Disease management   Start Date: 02/13/2021  Expected End Date: 02/13/2022  This Visit's Progress: On track  Priority: High  Note:   Current Barriers:  Unable to independently monitor therapeutic efficacy  Pharmacist Clinical Goal(s):  Patient will achieve adherence to monitoring guidelines and medication adherence to achieve therapeutic efficacy through collaboration with PharmD and provider.   Interventions: 1:1 collaboration with Binnie Rail, MD regarding development and update of comprehensive plan of care as evidenced by provider attestation and co-signature Inter-disciplinary care team collaboration (see longitudinal plan of care) Comprehensive medication review performed; medication list updated in electronic medical record  Hyperlipidemia: (LDL goal < 100) -Controlled - LDL is at goal, however from > 1 year ago -Current treatment: Simvastatin 20 mg daily -Educated on Benefits of statin for ASCVD risk reduction; -Recommended to continue current medication  Depression/Anxiety/Insomnia (Goal: manage symptoms) -Controlled - Pt reports sleeping well; her mood is good and circumstances are "calming down" with family; she is interested in coming off of escitalopram but worried about relapse of symptom; she has been on SSRI for about 4 months -Current treatment: Escitalopram 10 mg daily Eszopiclone 3 mg HS  -Medications previously tried/failed: mirtazapine -PHQ9: 0 (08/2020) -GAD7: Not on file -Connected with PCP for mental health support -Educated on Benefits of medication for symptom control; typical length of therapy for depressive episodes - it is recommended to continue SSRI for at least 6 months  after symptom improvement -Recommended to continue current medication - can consider tapering escitalopram later in the year   Patient Goals/Self-Care Activities Patient will:  - take medications as prescribed focus on medication adherence by routine -Continue Lexapro for at least 6 months after symptom improvement      Medication Assistance: None required.  Patient affirms current coverage meets needs.  Compliance/Adherence/Medication fill history: Care Gaps: Covid booster (due 01/21/20)  Star-Rating Drugs: Simvastatin - LF 12/18/20 x 90 ds  Patient's preferred pharmacy is:  [id number] [place] [id number]: [phone]: [phone] Uses pill box? No - not necessary Pt endorses 100% compliance  We discussed: Current pharmacy is preferred with insurance plan and patient is satisfied with pharmacy services Patient decided to: Continue current medication management strategy  Care Plan and Follow Up Patient Decision:  Patient agrees to Care Plan and Follow-up.  Plan: Telephone follow up appointment with care management team member scheduled for:  1 year  LCharlene Brooke PharmD, [place] CPP Clinical Pharmacist [phone]

## 2021-02-13 NOTE — Patient Instructions (Signed)
Visit Information  Phone number for Pharmacist: [phone]   Goals Addressed             This Visit's Progress    Manage My Medicine       Timeframe:  Long-Range Goal Priority:  Medium Start Date:       02/13/21                      Expected End Date:   02/13/22                    Follow Up Date Feb 2023   - call for medicine refill 2 or 3 days before it runs out - call if I am sick and can't take my medicine - keep a list of all the medicines I take; vitamins and herbals too  -Continue Lexapro for at least 6 months after symptom improvement   Why is this important?   These steps will help you keep on track with your medicines.   Notes:         Care Plan : [place]  Updates made by Charlton Haws, RPH since 02/13/2021 12:00 AM     Problem: Hyperlipidemia, Depression, and Anxiety   Priority: High     Long-Range Goal: Disease management   Start Date: 02/13/2021  Expected End Date: 02/13/2022  This Visit's Progress: On track  Priority: High  Note:   Current Barriers:  Unable to independently monitor therapeutic efficacy  Pharmacist Clinical Goal(s):  Patient will achieve adherence to monitoring guidelines and medication adherence to achieve therapeutic efficacy through collaboration with PharmD and provider.   Interventions: 1:1 collaboration with Binnie Rail, MD regarding development and update of comprehensive plan of care as evidenced by provider attestation and co-signature Inter-disciplinary care team collaboration (see longitudinal plan of care) Comprehensive medication review performed; medication list updated in electronic medical record  Hyperlipidemia: (LDL goal < 100) -Controlled - LDL is at goal, however from > 1 year ago -Current treatment: Simvastatin 20 mg daily -Educated on Benefits of statin for ASCVD risk reduction; -Recommended to continue current medication  Depression/Anxiety/Insomnia (Goal: manage  symptoms) -Controlled - Pt reports sleeping well; her mood is good and circumstances are "calming down" with family; she is interested in coming off of escitalopram but worried about relapse of symptom; she has been on SSRI for about 4 months -Current treatment: Escitalopram 10 mg daily Eszopiclone 3 mg HS  -Medications previously tried/failed: mirtazapine -PHQ9: 0 (08/2020) -GAD7: Not on file -Connected with PCP for mental health support -Educated on Benefits of medication for symptom control; typical length of therapy for depressive episodes - it is recommended to continue SSRI for at least 6 months after symptom improvement -Recommended to continue current medication - can consider tapering escitalopram later in the year   Patient Goals/Self-Care Activities Patient will:  - take medications as prescribed focus on medication adherence by routine -Continue Lexapro for at least 6 months after symptom improvement      Patient verbalizes understanding of instructions provided today and agrees to view in [place].  Telephone follow up appointment with pharmacy team member scheduled for: 1 year  Charlene Brooke, PharmD, [place], CPP Clinical Pharmacist [facility] [phone]

## 2021-03-28 DIAGNOSIS — J300 Vasomotor rhinitis: Secondary | ICD-10-CM | POA: Diagnosis not present

## 2021-08-07 ENCOUNTER — Other Ambulatory Visit: Payer: Self-pay | Admitting: Internal Medicine

## 2021-08-24 ENCOUNTER — Ambulatory Visit (INDEPENDENT_AMBULATORY_CARE_PROVIDER_SITE_OTHER): Payer: Medicare HMO

## 2021-08-24 ENCOUNTER — Other Ambulatory Visit: Payer: Self-pay

## 2021-08-24 DIAGNOSIS — Z0000 Encounter for general adult medical examination without abnormal findings: Secondary | ICD-10-CM

## 2021-08-24 NOTE — Patient Instructions (Signed)
Ms. Tanya [name] , Thank you for taking time to come for your Medicare Wellness Visit. I appreciate your ongoing commitment to your health goals. Please review the following plan we discussed and let me know if I can assist you in the future.   Screening recommendations/referrals: Colonoscopy: discontinued Mammogram: 09/17/2019; gets checked every 1-2 years Bone Density: 09/17/2019; due every 2-3 years Recommended yearly ophthalmology/optometry visit for glaucoma screening and checkup Recommended yearly dental visit for hygiene and checkup  Vaccinations: Influenza vaccine: 04/23/2021 Pneumococcal vaccine: 04/05/2015, 07/15/2016 Tdap vaccine: 01/05/2020; due every 10 years Shingles vaccine: 08/07/2017, 05/14/2018 Covid-19: 08/03/2019, 08/24/2019  Advanced directives: Please bring a copy of your health care power of attorney and living will to the office at your convenience.  Conditions/risks identified: Yes; My goal is to lose some weight, increase my water intake, stay away from sweets/breads and increase my walking.  Next appointment: Please schedule your next Medicare Wellness Visit with your Nurse Health Advisor in 1 year by calling [phone].   Preventive Care 78 Years and Older, Female Preventive care refers to lifestyle choices and visits with your health care provider that can promote health and wellness. What does preventive care include? A yearly physical exam. This is also called an annual well check. Dental exams once or twice a year. Routine eye exams. Ask your health care provider how often you should have your eyes checked. Personal lifestyle choices, including: Daily care of your teeth and gums. Regular physical activity. Eating a healthy diet. Avoiding tobacco and drug use. Limiting alcohol use. Practicing safe sex. Taking low-dose aspirin every day. Taking vitamin and mineral supplements as recommended by your health care provider. What happens during an annual well  check? The services and screenings done by your health care provider during your annual well check will depend on your age, overall health, lifestyle risk factors, and family history of disease. Counseling  Your health care provider may ask you questions about your: Alcohol use. Tobacco use. Drug use. Emotional well-being. Home and relationship well-being. Sexual activity. Eating habits. History of falls. Memory and ability to understand (cognition). Work and work Statistician. Reproductive health. Screening  You may have the following tests or measurements: Height, weight, and BMI. Blood pressure. Lipid and cholesterol levels. These may be checked every 5 years, or more frequently if you are over 78 years old. Skin check. Lung cancer screening. You may have this screening every year starting at age 78 if you have a 30-pack-year history of smoking and currently smoke or have quit within the past 15 years. Fecal occult blood test (FOBT) of the stool. You may have this test every year starting at age 78. Flexible sigmoidoscopy or colonoscopy. You may have a sigmoidoscopy every 5 years or a colonoscopy every 10 years starting at age 78. Hepatitis C blood test. Hepatitis B blood test. Sexually transmitted disease (STD) testing. Diabetes screening. This is done by checking your blood sugar (glucose) after you have not eaten for a while (fasting). You may have this done every 1-3 years. Bone density scan. This is done to screen for osteoporosis. You may have this done starting at age 78. Mammogram. This may be done every 1-2 years. Talk to your health care provider about how often you should have regular mammograms. Talk with your health care provider about your test results, treatment options, and if necessary, the need for more tests. Vaccines  Your health care provider may recommend certain vaccines, such as: Influenza vaccine. This is recommended every year. Tetanus,  diphtheria, and  acellular pertussis (Tdap, Td) vaccine. You may need a Td booster every 10 years. Zoster vaccine. You may need this after age 43. Pneumococcal 13-valent conjugate (PCV13) vaccine. One dose is recommended after age 78. Pneumococcal polysaccharide (PPSV23) vaccine. One dose is recommended after age 78. Talk to your health care provider about which screenings and vaccines you need and how often you need them. This information is not intended to replace advice given to you by your health care provider. Make sure you discuss any questions you have with your health care provider. Document Released: 07/28/2015 Document Revised: 03/20/2016 Document Reviewed: 05/02/2015 Elsevier Interactive Patient Education  2017 [place] Prevention in the Home Falls can cause injuries. They can happen to people of all ages. There are many things you can do to make your home safe and to help prevent falls. What can I do on the outside of my home? Regularly fix the edges of walkways and driveways and fix any cracks. Remove anything that might make you trip as you walk through a door, such as a raised step or threshold. Trim any bushes or trees on the path to your home. Use bright outdoor lighting. Clear any walking paths of anything that might make someone trip, such as rocks or tools. Regularly check to see if handrails are loose or broken. Make sure that both sides of any steps have handrails. Any raised decks and porches should have guardrails on the edges. Have any leaves, snow, or ice cleared regularly. Use sand or salt on walking paths during winter. Clean up any spills in your garage right away. This includes oil or grease spills. What can I do in the bathroom? Use night lights. Install grab bars by the toilet and in the tub and shower. Do not use towel bars as grab bars. Use non-skid mats or decals in the tub or shower. If you need to sit down in the shower, use a plastic, non-slip stool. Keep  the floor dry. Clean up any water that spills on the floor as soon as it happens. Remove soap buildup in the tub or shower regularly. Attach bath mats securely with double-sided non-slip rug tape. Do not have throw rugs and other things on the floor that can make you trip. What can I do in the bedroom? Use night lights. Make sure that you have a light by your bed that is easy to reach. Do not use any sheets or blankets that are too big for your bed. They should not hang down onto the floor. Have a firm chair that has side arms. You can use this for support while you get dressed. Do not have throw rugs and other things on the floor that can make you trip. What can I do in the kitchen? Clean up any spills right away. Avoid walking on wet floors. Keep items that you use a lot in easy-to-reach places. If you need to reach something above you, use a strong step stool that has a grab bar. Keep electrical cords out of the way. Do not use floor polish or wax that makes floors slippery. If you must use wax, use non-skid floor wax. Do not have throw rugs and other things on the floor that can make you trip. What can I do with my stairs? Do not leave any items on the stairs. Make sure that there are handrails on both sides of the stairs and use them. Fix handrails that are broken or loose. Make  sure that handrails are as long as the stairways. Check any carpeting to make sure that it is firmly attached to the stairs. Fix any carpet that is loose or worn. Avoid having throw rugs at the top or bottom of the stairs. If you do have throw rugs, attach them to the floor with carpet tape. Make sure that you have a light switch at the top of the stairs and the bottom of the stairs. If you do not have them, ask someone to add them for you. What else can I do to help prevent falls? Wear shoes that: Do not have high heels. Have rubber bottoms. Are comfortable and fit you well. Are closed at the toe. Do not  wear sandals. If you use a stepladder: Make sure that it is fully opened. Do not climb a closed stepladder. Make sure that both sides of the stepladder are locked into place. Ask someone to hold it for you, if possible. Clearly mark and make sure that you can see: Any grab bars or handrails. First and last steps. Where the edge of each step is. Use tools that help you move around (mobility aids) if they are needed. These include: Canes. Walkers. Scooters. Crutches. Turn on the lights when you go into a dark area. Replace any light bulbs as soon as they burn out. Set up your furniture so you have a clear path. Avoid moving your furniture around. If any of your floors are uneven, fix them. If there are any pets around you, be aware of where they are. Review your medicines with your doctor. Some medicines can make you feel dizzy. This can increase your chance of falling. Ask your doctor what other things that you can do to help prevent falls. This information is not intended to replace advice given to you by your health care provider. Make sure you discuss any questions you have with your health care provider. Document Released: 04/27/2009 Document Revised: 12/07/2015 Document Reviewed: 08/05/2014 Elsevier Interactive Patient Education  2017 [employer].

## 2021-08-24 NOTE — Progress Notes (Signed)
I connected with Tanya [name] today by telephone and verified that I am speaking with the correct person using two identifiers. Location patient: home Location provider: work Persons participating in the virtual visit: patient, provider.   I discussed the limitations, risks, security and privacy concerns of performing an evaluation and management service by telephone and the availability of in person appointments. I also discussed with the patient that there may be a patient responsible charge related to this service. The patient expressed understanding and verbally consented to this telephonic visit.    Interactive audio and video telecommunications were attempted between this provider and patient, however failed, due to patient having technical difficulties OR patient did not have access to video capability.  We continued and completed visit with audio only.  Some vital signs may be absent or patient reported.   Time Spent with patient on telephone encounter: 40 minutes  Subjective:   Tanya [name] is a 76 y.o. female who presents for Medicare Annual (Subsequent) preventive examination.  Review of Systems     Cardiac Risk Factors include: advanced age (>17men, >33 women);dyslipidemia;family history of premature cardiovascular disease     Objective:    There were no vitals filed for this visit. There is no height or weight on file to calculate BMI.  Advanced Directives 08/24/2021 08/23/2020 01/28/2020 05/31/2019 02/13/2019 02/01/2019 01/28/2019  Does Patient Have a Medical Advance Directive? Yes Yes Yes Yes Yes Yes Yes  Type of Advance Directive Living will;Healthcare Power of [place];Living will [place];Living will [place];Living will [place];Living will [place];Living will [place];Living will  Does patient want to make changes to medical advance directive?  No - Patient declined No - Patient declined - - - No - Patient declined No - Patient declined  Copy of [place] in Chart? No - copy requested No - copy requested - - No - copy requested Yes - validated most recent copy scanned in chart (See row information) Yes - validated most recent copy scanned in chart (See row information)  Pre-existing out of facility DNR order (yellow form or pink MOST form) - - - - - - -    Current Medications (verified) Outpatient Encounter Medications as of 08/24/2021  Medication Sig   Eszopiclone 3 MG TABS TAKE 1 TABLET (3 MG TOTAL) BY MOUTH AT BEDTIME. TAKE IMMEDIATELY BEFORE BEDTIME   azelastine (ASTELIN) 0.1 % nasal spray Place 1 spray into both nostrils 2 (two) times daily. Use in each nostril as directed   Biotin 10 MG TABS Take 1 tablet by mouth daily.   Calcium Carb-Cholecalciferol (CALCIUM/VITAMIN D) 500-200 MG-UNIT TABS Take 1 tablet by mouth daily.   cetirizine (ZYRTEC) 10 MG tablet TAKE 1 TABLET (10 MG TOTAL) BY MOUTH DAILY. KEEP SCHEDULED APPT FOR FUTURE REFILLS   dimenhyDRINATE (DRAMAMINE) 50 MG tablet Take 50 mg by mouth every 8 (eight) hours as needed for dizziness.   ELDERBERRY PO Take 1 capsule by mouth daily.   escitalopram (LEXAPRO) 10 MG tablet TAKE 1 TABLET BY MOUTH EVERY DAY   fluticasone (FLONASE) 50 MCG/ACT nasal spray Place 1 spray into the nose daily.    ipratropium (ATROVENT) 0.06 % nasal spray Place 1 spray into both nostrils 2 (two) times a day.    levocetirizine (XYZAL) 5 MG tablet TAKE 1 TABLET BY MOUTH EVERY DAY IN THE EVENING   meclizine (ANTIVERT) 12.5 MG tablet Take 1 tablet (  12.5 mg total) by mouth 3 (three) times daily as needed for dizziness.   metroNIDAZOLE (METROCREAM) 0.75 % cream    Multiple Vitamin (MULTIVITAMIN) tablet Take 1 tablet by mouth daily.   simvastatin (ZOCOR) 20 MG tablet TAKE 1 TABLET BY MOUTH DAILY IN THE EVENING.   No facility-administered encounter medications on file as of 08/24/2021.     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   COLONOSCOPY  04/14/2006   Tics ; Dr Earlean Shawl   KNEE ARTHROSCOPY  2001 & 2011   L & R   TOTAL KNEE ARTHROPLASTY  02/24/2012   Procedure: TOTAL KNEE ARTHROPLASTY;  Surgeon: Gearlean Alf, MD;  Location: [facility];  Service: Orthopedics;  Laterality: Right;   TOTAL KNEE ARTHROPLASTY Left 02/01/2019   Procedure: TOTAL KNEE ARTHROPLASTY;  Surgeon: Gaynelle Arabian, MD;  Location: [facility];  Service: Orthopedics;  Laterality: Left;  75min   WISDOM TOOTH EXTRACTION     Family History  Problem Relation Age of Onset   Hypertension Mother    Heart disease Mother        pacer   Colon polyps Mother        pre cancerous   Glaucoma Mother    Dementia Mother    Heart attack Maternal Grandmother 94   Hypertension Maternal Grandmother    Stroke Maternal Grandfather 82   Hypertension Maternal Grandfather    Heart attack Maternal Uncle 61   Cancer Neg Hx    Colon cancer Neg Hx    Esophageal cancer Neg Hx    Rectal cancer Neg Hx    Stomach cancer Neg Hx    Pancreatic cancer Neg Hx    Social History   Socioeconomic History   Marital status: Married    Spouse name: Not on file   Number of children: Not on file   Years of education: Not on file   Highest education level: Not on file  Occupational History   Not on file  Tobacco Use   Smoking status: Never   Smokeless tobacco: Never  Vaping Use   Vaping Use:  Never used  Substance and Sexual Activity   Alcohol use: Yes    Comment:  very rarely, 1-2 per month   Drug use: No   Sexual activity: Not Currently  Other Topics Concern   Not on file  Social History Narrative   Not on file   Social Determinants of Health   Financial Resource Strain: Low Risk    Difficulty of Paying Living Expenses: Not hard at all  Food Insecurity: No Food Insecurity   Worried About Charity fundraiser in the Last Year: Never true   Ran Out of Food in the Last Year: Never true  Transportation Needs: No Transportation Needs   Lack of Transportation (Medical): No   Lack of Transportation (Non-Medical):  No  Physical Activity: Sufficiently Active   Days of Exercise per Week: 5 days   Minutes of Exercise per Session: 30 min  Stress: No Stress Concern Present   Feeling of Stress : Not at all  Social Connections: Socially Integrated   Frequency of Communication with Friends and Family: More than three times a week   Frequency of Social Gatherings with Friends and Family: More than three times a week   Attends Religious Services: More than 4 times per year   Active Member of [employer] or Organizations: Yes   Attends Music therapist: More than 4 times per year   Marital Status: Married    Tobacco Counseling Counseling given: Not Answered   Clinical Intake:  Pre-visit preparation completed: Yes  Pain : No/denies pain     Nutritional Risks: None Diabetes: No  How often do you need to have someone help you when you read instructions, pamphlets, or other written materials from your doctor or pharmacy?: 1 - Never What is the last grade level you completed in school?: 2 years of college  Diabetic? no  Interpreter Needed?: No  Information entered by :: Lisette Abu, LPN   Activities of Daily Living In your present state of health, do you have any difficulty performing the following activities: 08/24/2021  Hearing? N  Vision? N   Difficulty concentrating or making decisions? N  Walking or climbing stairs? N  Dressing or bathing? N  Doing errands, shopping? N  Preparing Food and eating ? N  Using the Toilet? N  In the past six months, have you accidently leaked urine? N  Do you have problems with loss of bowel control? N  Managing your Medications? N  Managing your Finances? N  Housekeeping or managing your Housekeeping? N  Some recent data might be hidden    Patient Care Team: Binnie Rail, MD as PCP - General (Internal Medicine) Charlton Haws, [facility] as Pharmacist (Pharmacist) Monna Fam, MD as Consulting Physician (Ophthalmology)  Indicate any recent Medical Services you may have received from other than [facility] providers in the past year (date may be approximate).     Assessment:   This is a routine wellness examination for Tanya [name].  Hearing/Vision screen Hearing Screening - Comments:: Patient denied any hearing difficulty.   No hearing aids.  Vision Screening - Comments:: Patient wears corrective glasses/contacts.   Eye exam done annually by: Monna Fam, MD.  Dietary issues and exercise activities discussed: Current Exercise Habits: Home exercise routine, Type of exercise: walking, Time (Minutes): 30, Frequency (Times/Week): 5, Weekly Exercise (Minutes/Week): 150, Intensity: Moderate, Exercise limited by: None identified   Goals Addressed               This Visit's Progress     DIET - INCREASE WATER INTAKE (pt-stated)        My goal is to lose some weight, increase my water intake, stay away from sweets/breads and increase my walking.      Depression Screen PHQ 2/9 Scores 08/24/2021 08/23/2020 12/17/2019 09/15/2018 04/21/2017 04/18/2016 06/23/2013  PHQ - 2 Score 0 0 0 0 0 0 0    Fall Risk Fall Risk  08/24/2021 08/23/2020 12/17/2019 09/15/2018 04/21/2017  Falls in the past year? 0 1 0 1 No  Number falls in past yr: 0 0 0 0 -  Injury with Fall? 0 0 0 1 -  Risk for fall due to : No Fall  Risks Impaired balance/gait - - -  Follow up  Falls evaluation completed Falls evaluation completed Falls evaluation completed - -    FALL RISK PREVENTION PERTAINING TO THE HOME:  Any stairs in or around the home? Yes  If so, are there any without handrails? No  Home free of loose throw rugs in walkways, pet beds, electrical cords, etc? Yes  Adequate lighting in your home to reduce risk of falls? Yes   ASSISTIVE DEVICES UTILIZED TO PREVENT FALLS:  Life alert? No  Use of a cane, walker or w/c? No  Grab bars in the bathroom? No  Shower chair or bench in shower? No  Elevated toilet seat or a handicapped toilet? Yes   TIMED UP AND GO:  Was the test performed? No .  Length of time to ambulate 10 feet: n/a sec.   Gait steady and fast without use of assistive device  Cognitive Function: Normal cognitive status assessed by direct observation by this Nurse Health Advisor. No abnormalities found.          Immunizations Immunization History  Administered Date(s) Administered   Fluad Quad(high Dose 65+) 06/19/2020   Influenza Whole 04/06/2009, 06/15/2011   Influenza, High Dose Seasonal PF 04/29/2014, 05/03/2018, 05/17/2019, 04/23/2021   Influenza,inj,quad, With Preservative 05/15/2017, 05/03/2018   Influenza-Unspecified 04/19/2017   PFIZER(Purple Top)SARS-COV-2 Vaccination 08/03/2019, 08/24/2019   Pneumococcal Conjugate-13 04/05/2015   Pneumococcal Polysaccharide-23 07/10/2009   Pneumococcal-Unspecified 07/15/2016   Td 04/06/2009   Tdap 01/05/2020   Zoster Recombinat (Shingrix) 08/07/2017, 05/14/2018   Zoster, Live 07/03/2009    TDAP status: Up to date  Flu Vaccine status: Up to date  Pneumococcal vaccine status: Up to date  Covid-19 vaccine status: Completed vaccines  Qualifies for Shingles Vaccine? Yes   Zostavax completed Yes   Shingrix Completed?: Yes  Screening Tests Health Maintenance  Topic Date Due   COVID-19 Vaccine (3 - Booster for Pfizer series)  10/19/2019   DEXA SCAN  09/16/2021   TETANUS/TDAP  01/04/2030   Pneumonia Vaccine 44+ Years old  Completed   INFLUENZA VACCINE  Completed   Hepatitis C Screening  Completed   Zoster Vaccines- Shingrix  Completed   HPV VACCINES  Aged Out   COLONOSCOPY (Pts 45-12yrs Insurance coverage will need to be confirmed)  [place] Maintenance Due  Topic Date Due   COVID-19 Vaccine (3 - Booster for [place] series) 10/19/2019    Colorectal cancer screening: No longer required.   Mammogram status: Completed 09/17/2019. Repeat every year (History of breast cancer)  Bone Density status: Completed 09/17/2019. Results reflect: Bone density results: OSTEOPENIA. Repeat every 2-3 years.  Lung Cancer Screening: (Low Dose CT Chest recommended if Age 63-80 years, 30 pack-year currently smoking OR have quit w/in 15years.) does not qualify.   Lung Cancer Screening Referral: no  Additional Screening:  Hepatitis C Screening: does qualify; Completed yes  Vision Screening: Recommended annual ophthalmology exams for early detection of glaucoma and other disorders of the eye. Is the patient up to date with their annual eye exam?  Yes  Who is the provider or what is the name of the office in which the patient attends annual eye exams? Monna Fam, MD. If pt is not established with a provider, would they like to be referred to a provider to establish care? No .   Dental Screening: Recommended annual dental exams for proper oral hygiene  Community Resource Referral / Chronic Care Management: CRR required this visit?  No   CCM required this visit?  No  Plan:     I have personally reviewed and noted the following in the patients chart:   Medical and social history Use of alcohol, tobacco or illicit drugs  Current medications and supplements including opioid prescriptions.  Functional ability and status Nutritional status Physical activity Advanced  directives List of other physicians Hospitalizations, surgeries, and ER visits in previous 12 months Vitals Screenings to include cognitive, depression, and falls Referrals and appointments  In addition, I have reviewed and discussed with patient certain preventive protocols, quality metrics, and best practice recommendations. A written personalized care plan for preventive services as well as general preventive health recommendations were provided to patient.     Tanya Flow, LPN   [id number]   Nurse Notes:  Patient is cogitatively intact. There were no vitals filed for this visit. There is no height or weight on file to calculate BMI. Patient stated that she has no issues with gait or balance; does not use any assistive devices. Medications reviewed with patient; no opioid use noted.

## 2021-09-26 DIAGNOSIS — L821 Other seborrheic keratosis: Secondary | ICD-10-CM | POA: Diagnosis not present

## 2021-09-26 DIAGNOSIS — L570 Actinic keratosis: Secondary | ICD-10-CM | POA: Diagnosis not present

## 2021-09-26 DIAGNOSIS — Z85828 Personal history of other malignant neoplasm of skin: Secondary | ICD-10-CM | POA: Diagnosis not present

## 2021-09-26 DIAGNOSIS — D485 Neoplasm of uncertain behavior of skin: Secondary | ICD-10-CM | POA: Diagnosis not present

## 2021-09-26 DIAGNOSIS — D0439 Carcinoma in situ of skin of other parts of face: Secondary | ICD-10-CM | POA: Diagnosis not present

## 2021-10-10 ENCOUNTER — Other Ambulatory Visit: Payer: Self-pay | Admitting: Internal Medicine

## 2021-10-10 DIAGNOSIS — Z85828 Personal history of other malignant neoplasm of skin: Secondary | ICD-10-CM | POA: Diagnosis not present

## 2021-10-10 DIAGNOSIS — D0439 Carcinoma in situ of skin of other parts of face: Secondary | ICD-10-CM | POA: Diagnosis not present

## 2021-10-27 ENCOUNTER — Other Ambulatory Visit: Payer: Self-pay | Admitting: Internal Medicine

## 2021-11-15 ENCOUNTER — Encounter: Payer: Self-pay | Admitting: Internal Medicine

## 2021-11-15 NOTE — Progress Notes (Signed)
? ? ? ? ?Subjective:  ? ? Patient ID: Tanya [name], female    DOB: September 18, 1945, 76 y.o.   MRN: [id number] ? ?This visit occurred during the SARS-CoV-2 public health emergency.  Safety protocols were in place, including screening questions prior to the visit, additional usage of staff PPE, and extensive cleaning of exam room while observing appropriate contact time as indicated for disinfecting solutions.   ? ? ?HPI ?Messiah is here for follow up of her chronic medical problems, including prediabetes, hld, anxiety, depression, insomnia ? ?Her esophagus is starting to close up again - she notices it with meat.  It is getting worse.  She had her esophagus dilated in 2020, which was the first time.  He does have occasional reflux.  She typically just drinks water when she feels this and that helps. ? ?Has gained weight.  Has little energy.  She wonders if the Lexapro is causing some of the weight gain. ? ?She stopped taking lunesta - was only sleeping 4.5 hrs.   She stopped it and there was no change.   ? ?Gets on bike at work - does 3 miles.  She tries to move more during the day ? ? ? ?Medications and allergies reviewed with patient and updated if appropriate. ? ?Current Outpatient Medications on File Prior to Visit  ?Medication Sig Dispense Refill  ? azelastine (ASTELIN) 0.1 % nasal spray Place 1 spray into both nostrils 2 (two) times daily. Use in each nostril as directed    ? Biotin 10 MG TABS Take 1 tablet by mouth daily.    ? Calcium Carb-Cholecalciferol (CALCIUM/VITAMIN D) 500-200 MG-UNIT TABS Take 1 tablet by mouth daily.    ? cetirizine (ZYRTEC) 10 MG tablet TAKE 1 TABLET (10 MG TOTAL) BY MOUTH DAILY. KEEP SCHEDULED APPT FOR FUTURE REFILLS 90 tablet 0  ? dimenhyDRINATE (DRAMAMINE) 50 MG tablet Take 50 mg by mouth every 8 (eight) hours as needed for dizziness.    ? ELDERBERRY PO Take 1 capsule by mouth daily.    ? escitalopram (LEXAPRO) 10 MG tablet TAKE 1 TABLET BY MOUTH EVERY DAY 90 tablet 0  ? Eszopiclone 3 MG  TABS Take 1 tablet (3 mg total) by mouth at bedtime. Take immediately before bedtime.  Needs follow up visit for additional refills 30 tablet 0  ? fluticasone (FLONASE) 50 MCG/ACT nasal spray Place 1 spray into the nose daily.     ? ipratropium (ATROVENT) 0.06 % nasal spray Place 1 spray into both nostrils 2 (two) times a day.     ? levocetirizine (XYZAL) 5 MG tablet TAKE 1 TABLET BY MOUTH EVERY DAY IN THE EVENING 90 tablet 2  ? meclizine (ANTIVERT) 12.5 MG tablet Take 1 tablet (12.5 mg total) by mouth 3 (three) times daily as needed for dizziness. 15 tablet 0  ? metroNIDAZOLE (METROCREAM) 0.75 % cream     ? Multiple Vitamin (MULTIVITAMIN) tablet Take 1 tablet by mouth daily.    ? simvastatin (ZOCOR) 20 MG tablet TAKE 1 TABLET BY MOUTH DAILY IN THE EVENING. 90 tablet 3  ? ?No current facility-administered medications on file prior to visit.  ? ? ? ?Review of Systems  ?Constitutional:  Negative for fever.  ?Respiratory:  Negative for cough, shortness of breath and wheezing.   ?Cardiovascular:  Negative for chest pain, palpitations and leg swelling.  ?Neurological:  Negative for light-headedness and headaches.  ? ?   ?Objective:  ? ?Vitals:  ? 11/16/21 0749  ?BP: 140/82  ?Pulse:  74  ?Temp: 98 ?F (36.7 ?C)  ?SpO2: 99%  ? ?BP Readings from Last 3 Encounters:  ?11/16/21 140/82  ?10/20/20 140/70  ?06/19/20 (!) 148/82  ? ?Wt Readings from Last 3 Encounters:  ?11/16/21 251 lb (113.9 kg)  ?10/20/20 240 lb (108.9 kg)  ?06/19/20 235 lb (106.6 kg)  ? ?Body mass index is 43.08 kg/m?. ? ?  ?Physical Exam ?Constitutional:   ?   General: She is not in acute distress. ?   Appearance: Normal appearance.  ?HENT:  ?   Head: Normocephalic and atraumatic.  ?Eyes:  ?   Conjunctiva/sclera: Conjunctivae normal.  ?Cardiovascular:  ?   Rate and Rhythm: Normal rate and regular rhythm.  ?   Heart sounds: Normal heart sounds. No murmur heard. ?Pulmonary:  ?   Effort: Pulmonary effort is normal. No respiratory distress.  ?   Breath sounds: Normal  breath sounds. No wheezing.  ?Musculoskeletal:  ?   Cervical back: Neck supple.  ?   Right lower leg: No edema.  ?   Left lower leg: No edema.  ?Lymphadenopathy:  ?   Cervical: No cervical adenopathy.  ?Skin: ?   General: Skin is warm and dry.  ?   Findings: No rash.  ?Neurological:  ?   Mental Status: She is alert. Mental status is at baseline.  ?Psychiatric:     ?   Mood and Affect: Mood normal.     ?   Behavior: Behavior normal.  ? ?   ? ?Lab Results  ?Component Value Date  ? WBC 8.8 12/17/2019  ? HGB 14.2 12/17/2019  ? HCT 42.8 12/17/2019  ? PLT 345.0 12/17/2019  ? GLUCOSE 117 (H) 12/17/2019  ? CHOL 196 12/17/2019  ? TRIG 130.0 12/17/2019  ? HDL 69.00 12/17/2019  ? LDLDIRECT 148.0 07/10/2010  ? LDLCALC 101 (H) 12/17/2019  ? ALT 14 12/17/2019  ? AST 17 12/17/2019  ? NA 140 12/17/2019  ? K 4.0 12/17/2019  ? CL 103 12/17/2019  ? CREATININE 0.81 12/17/2019  ? BUN 20 12/17/2019  ? CO2 31 12/17/2019  ? TSH 1.35 12/17/2019  ? INR 1.0 01/28/2019  ? HGBA1C 5.4 01/28/2019  ? ? ? ?Assessment & Plan:  ? ? ?See Problem List for Assessment and Plan of chronic medical problems.  ? ? ?

## 2021-11-15 NOTE — Patient Instructions (Addendum)
? ? ?  GI - Dr Hilarie Fredrickson  - Phone: [phone]  ? ? ?Blood work was ordered.   ? ? ?Medications changes include :   start pepcid 40 mg daily, wellbutrin xl 150 mg daily ? ? ?Your prescription(s) have been sent to your pharmacy.  ? ? ?A referral was ordered for the healthy weight and wellness clinic.  ? ? ? ?Return in about 6 months (around 05/19/2022) for Physical Exam. ? ?

## 2021-11-16 ENCOUNTER — Ambulatory Visit (INDEPENDENT_AMBULATORY_CARE_PROVIDER_SITE_OTHER): Payer: Medicare HMO | Admitting: Internal Medicine

## 2021-11-16 VITALS — BP 140/82 | HR 74 | Temp 98.0°F | Ht 64.0 in | Wt 251.0 lb

## 2021-11-16 DIAGNOSIS — Z6841 Body Mass Index (BMI) 40.0 and over, adult: Secondary | ICD-10-CM | POA: Diagnosis not present

## 2021-11-16 DIAGNOSIS — R1310 Dysphagia, unspecified: Secondary | ICD-10-CM

## 2021-11-16 DIAGNOSIS — K219 Gastro-esophageal reflux disease without esophagitis: Secondary | ICD-10-CM | POA: Diagnosis not present

## 2021-11-16 DIAGNOSIS — G4709 Other insomnia: Secondary | ICD-10-CM | POA: Diagnosis not present

## 2021-11-16 DIAGNOSIS — F32A Depression, unspecified: Secondary | ICD-10-CM | POA: Diagnosis not present

## 2021-11-16 DIAGNOSIS — R7303 Prediabetes: Secondary | ICD-10-CM | POA: Diagnosis not present

## 2021-11-16 DIAGNOSIS — E7849 Other hyperlipidemia: Secondary | ICD-10-CM | POA: Diagnosis not present

## 2021-11-16 DIAGNOSIS — F419 Anxiety disorder, unspecified: Secondary | ICD-10-CM | POA: Diagnosis not present

## 2021-11-16 LAB — LIPID PANEL
Cholesterol: 187 mg/dL (ref 0–200)
HDL: 64.7 mg/dL (ref >39.00)
LDL Cholesterol: 100 mg/dL — ABNORMAL HIGH (ref 0–99)
NonHDL: 122.26
Total CHOL/HDL Ratio: 3
Triglycerides: 112 mg/dL (ref 0.0–149.0)
VLDL: 22.4 mg/dL (ref 0.0–40.0)

## 2021-11-16 LAB — COMPREHENSIVE METABOLIC PANEL
ALT: 14 U/L (ref 0–35)
AST: 17 U/L (ref 0–37)
Albumin: 4.4 g/dL (ref 3.5–5.2)
Alkaline Phosphatase: 69 U/L (ref 39–117)
BUN: 19 mg/dL (ref 6–23)
CO2: 29 mEq/L (ref 19–32)
Calcium: 9.5 mg/dL (ref 8.4–10.5)
Chloride: 100 mEq/L (ref 96–112)
Creatinine, Ser: 0.87 mg/dL (ref 0.40–1.20)
GFR: 63.93 mL/min (ref >60.00)
Glucose, Bld: 85 mg/dL (ref 70–99)
Potassium: 4.4 mEq/L (ref 3.5–5.1)
Sodium: 137 mEq/L (ref 135–145)
Total Bilirubin: 0.6 mg/dL (ref 0.2–1.2)
Total Protein: 7.3 g/dL (ref 6.0–8.3)

## 2021-11-16 LAB — HEMOGLOBIN A1C: Hgb A1c MFr Bld: 5.6 % (ref 4.6–6.5)

## 2021-11-16 MED ORDER — BUPROPION HCL ER (XL) 150 MG PO TB24: 150.0000 mg | ORAL_TABLET | Freq: Every day | ORAL | 5 refills | Status: DC

## 2021-11-16 MED ORDER — FAMOTIDINE 40 MG PO TABS: 40.0000 mg | ORAL_TABLET | Freq: Every day | ORAL | 3 refills | Status: DC

## 2021-11-16 NOTE — Assessment & Plan Note (Signed)
Chronic ?She is experiencing occasional reflux and given the dysphagia there is a good chance she is having it more than she realizes ?Start Pepcid 40 mg daily ?We will call and schedule with GI for possible EGD/dilation ?

## 2021-11-16 NOTE — Assessment & Plan Note (Addendum)
Chronic ?Controlled, Stable per patient ?She is concerned the Lexapro may be influencing her weight gain ?Start Wellbutrin XL 150 mg daily ?Continue Lexapro 10 mg daily-discussed that she can try decreasing this to 5 mg daily if the Wellbutrin helps and potentially discontinuing it if its not needed ?

## 2021-11-16 NOTE — Assessment & Plan Note (Addendum)
Chronic ?Sleeping 4-1/2 hours with or without Lunesta so it was not helping much so she discontinued it ?Agree with not taking it if its not helping ?Has tried other medications in the past that were not effective ?Encouraged increased exercise to see if that helps ? ?

## 2021-11-16 NOTE — Assessment & Plan Note (Signed)
Chronic Check a1c Low sugar / carb diet Stressed regular exercise  

## 2021-11-16 NOTE — Assessment & Plan Note (Signed)
Chronic ?She has gained weight and knows she needs to work on weight loss ?Discussed the importance of decreased portions, decreasing carbs/sugars and increasing protein ?Encouraged regular exercise, but told her the most important thing for weight loss is adjusting her diet ?Has done weight watchers in the past ?Wonders about weight loss clinic that may help-referral ordered for healthy weight and wellness ?

## 2021-11-16 NOTE — Assessment & Plan Note (Signed)
Chronic °Regular exercise and healthy diet encouraged °Check lipid panel  °Continue simvastatin 20 mg daily °

## 2021-11-16 NOTE — Assessment & Plan Note (Addendum)
Having increased dysphagia ?S/p dilation once in 2020 ?Has occasional gerd - not on medication ?Start pepcid 40 mg daily she will call Dr Hilarie Fredrickson to schedule an appt - needs and EGD ?

## 2021-12-03 ENCOUNTER — Encounter: Payer: Self-pay | Admitting: Internal Medicine

## 2021-12-04 MED ORDER — BUPROPION HCL ER (XL) 150 MG PO TB24: 150.0000 mg | ORAL_TABLET | Freq: Every day | ORAL | 1 refills | Status: DC

## 2021-12-07 MED ORDER — TEMAZEPAM 15 MG PO CAPS: 15.0000 mg | ORAL_CAPSULE | Freq: Every evening | ORAL | 0 refills | Status: DC | PRN

## 2021-12-07 NOTE — Addendum Note (Signed)
Addended by: Binnie Rail on: 12/07/2021 01:15 PM   Modules accepted: Orders

## 2021-12-13 ENCOUNTER — Encounter: Payer: Self-pay | Admitting: Internal Medicine

## 2022-01-31 ENCOUNTER — Other Ambulatory Visit: Payer: Self-pay | Admitting: Internal Medicine

## 2022-01-31 DIAGNOSIS — E7849 Other hyperlipidemia: Secondary | ICD-10-CM

## 2022-02-04 ENCOUNTER — Telehealth: Payer: Medicare HMO

## 2022-02-15 DIAGNOSIS — M25552 Pain in left hip: Secondary | ICD-10-CM | POA: Diagnosis not present

## 2022-02-18 DIAGNOSIS — L719 Rosacea, unspecified: Secondary | ICD-10-CM | POA: Diagnosis not present

## 2022-02-18 DIAGNOSIS — H04123 Dry eye syndrome of bilateral lacrimal glands: Secondary | ICD-10-CM | POA: Diagnosis not present

## 2022-02-18 DIAGNOSIS — H35363 Drusen (degenerative) of macula, bilateral: Secondary | ICD-10-CM | POA: Diagnosis not present

## 2022-02-18 DIAGNOSIS — H524 Presbyopia: Secondary | ICD-10-CM | POA: Diagnosis not present

## 2022-02-18 DIAGNOSIS — H35033 Hypertensive retinopathy, bilateral: Secondary | ICD-10-CM | POA: Diagnosis not present

## 2022-02-28 DIAGNOSIS — Z0289 Encounter for other administrative examinations: Secondary | ICD-10-CM

## 2022-03-14 ENCOUNTER — Encounter (INDEPENDENT_AMBULATORY_CARE_PROVIDER_SITE_OTHER): Payer: Self-pay | Admitting: Family Medicine

## 2022-03-14 ENCOUNTER — Ambulatory Visit (INDEPENDENT_AMBULATORY_CARE_PROVIDER_SITE_OTHER): Payer: Medicare HMO | Admitting: Family Medicine

## 2022-03-14 VITALS — BP 125/77 | HR 71 | Temp 97.5°F | Ht 64.0 in | Wt 251.0 lb

## 2022-03-14 DIAGNOSIS — F419 Anxiety disorder, unspecified: Secondary | ICD-10-CM | POA: Diagnosis not present

## 2022-03-14 DIAGNOSIS — M8580 Other specified disorders of bone density and structure, unspecified site: Secondary | ICD-10-CM | POA: Diagnosis not present

## 2022-03-14 DIAGNOSIS — E785 Hyperlipidemia, unspecified: Secondary | ICD-10-CM | POA: Diagnosis not present

## 2022-03-14 DIAGNOSIS — Z72820 Sleep deprivation: Secondary | ICD-10-CM | POA: Diagnosis not present

## 2022-03-14 DIAGNOSIS — Z6841 Body Mass Index (BMI) 40.0 and over, adult: Secondary | ICD-10-CM

## 2022-03-14 DIAGNOSIS — R7303 Prediabetes: Secondary | ICD-10-CM

## 2022-03-14 DIAGNOSIS — E669 Obesity, unspecified: Secondary | ICD-10-CM

## 2022-03-14 DIAGNOSIS — R5383 Other fatigue: Secondary | ICD-10-CM

## 2022-03-14 DIAGNOSIS — R0602 Shortness of breath: Secondary | ICD-10-CM | POA: Diagnosis not present

## 2022-03-14 DIAGNOSIS — F32A Depression, unspecified: Secondary | ICD-10-CM

## 2022-03-15 LAB — T4, FREE: Free T4: 1.07 ng/dL (ref 0.82–1.77)

## 2022-03-15 LAB — CBC WITH DIFFERENTIAL/PLATELET
Basophils Absolute: 0.1 10*3/uL (ref 0.0–0.2)
Basos: 1 %
EOS (ABSOLUTE): 0.2 10*3/uL (ref 0.0–0.4)
Eos: 4 %
Hematocrit: 43.7 % (ref 34.0–46.6)
Hemoglobin: 14.5 g/dL (ref 11.1–15.9)
Immature Grans (Abs): 0 10*3/uL (ref 0.0–0.1)
Immature Granulocytes: 0 %
Lymphocytes Absolute: 1.4 10*3/uL (ref 0.7–3.1)
Lymphs: 21 %
MCH: 30.8 pg (ref 26.6–33.0)
MCHC: 33.2 g/dL (ref 31.5–35.7)
MCV: 93 fL (ref 79–97)
Monocytes Absolute: 0.7 10*3/uL (ref 0.1–0.9)
Monocytes: 11 %
Neutrophils Absolute: 4.1 10*3/uL (ref 1.4–7.0)
Neutrophils: 63 %
Platelets: 349 10*3/uL (ref 150–450)
RBC: 4.71 x10E6/uL (ref 3.77–5.28)
RDW: 12.4 % (ref 11.7–15.4)
WBC: 6.5 10*3/uL (ref 3.4–10.8)

## 2022-03-15 LAB — HEMOGLOBIN A1C
Est. average glucose Bld gHb Est-mCnc: 117 mg/dL
Hgb A1c MFr Bld: 5.7 % — ABNORMAL HIGH (ref 4.8–5.6)

## 2022-03-15 LAB — VITAMIN B12: Vitamin B-12: 432 pg/mL (ref 232–1245)

## 2022-03-15 LAB — VITAMIN D 25 HYDROXY (VIT D DEFICIENCY, FRACTURES): Vit D, 25-Hydroxy: 32.5 ng/mL (ref 30.0–100.0)

## 2022-03-15 LAB — INSULIN, RANDOM: INSULIN: 10.1 u[IU]/mL (ref 2.6–24.9)

## 2022-03-15 LAB — TSH: TSH: 2.31 u[IU]/mL (ref 0.450–4.500)

## 2022-03-27 NOTE — Progress Notes (Signed)
Chief Complaint:   Tanya [name] (MR# [id number]) is a 76 y.o. female who presents for evaluation and treatment of Tanya and related comorbidities. Current BMI is Body mass index is 43.08 kg/m. Tanya [name] has been struggling with her weight for many years and has been unsuccessful in either losing weight, maintaining weight loss, or reaching her healthy weight goal.  Tanya [name] struggled with weight all of her life with long history of yo -yo dieting.  Has never used AOM's.  Likes to [employer] and eats more sweets and breads.  Avoid sugar sweetened beverages.  Daughter lives with her.  Her husband is a big meat and potato eater.  Works a sedentary job.  Tends to skip meals.  Craves sugar and snacks more at night.  Tanya [name] is currently in the action stage of change and ready to dedicate time achieving and maintaining a healthier weight. Tanya [name] is interested in becoming our patient and working on intensive lifestyle modifications including (but not limited to) diet and exercise for weight loss.  Tanya [name]'s habits were reviewed today and are as follows: Her family eats meals together, she thinks her family will eat healthier with her, her desired weight loss is 76 lbs, she has been heavy most of her life, she started gaining weight in later years, her heaviest weight ever was 243 pounds, she has significant food cravings issues, she snacks frequently in the evenings, she skips meals frequently, she is frequently drinking liquids with calories, she frequently makes poor food choices, she has problems with excessive hunger, she frequently eats larger portions than normal, and she struggles with emotional eating.  Depression Screen Tanya [name]'s Food and Mood (modified PHQ-9) score was 24.     03/14/2022    8:45 AM  Depression screen PHQ 2/9  Decreased Interest 3  Down, Depressed, Hopeless 3  PHQ - 2 Score 6  Altered sleeping 3  Tired, decreased energy 3  Change in appetite 3  Feeling bad or failure  about yourself  3  Trouble concentrating 3  Moving slowly or fidgety/restless 3  Suicidal thoughts 0  PHQ-9 Score 24  Difficult doing work/chores Not difficult at all   Subjective:   1. Other fatigue Tanya [name] admits to daytime somnolence and admits to waking up still tired. Patient has a history of symptoms of daytime fatigue and morning fatigue. Tanya [name] generally gets 3 or 4 hours of sleep per night, and states that she has nightime awakenings. Snoring is not present. Apneic episodes are not present. Epworth Sleepiness Score is 7.   2. SOBOE (shortness of breath on exertion) Tanya [name] notes increasing shortness of breath with exercising and seems to be worsening over time with weight gain. She notes getting out of breath sooner with activity than she used to. This has not gotten worse recently. Tanya [name] denies shortness of breath at rest or orthopnea.  3. Hyperlipidemia, unspecified hyperlipidemia type Tanya [name] is on simvastatin 20 mg once daily.  She denies myalgias or chest pain.  Last FLP was at goal.  4. Poor sleep Tanya [name] was previously on sleep medications.  She never had a sleep study, and she denies snoring.  5. Prediabetes Tanya [name]'s last A1c was 7.5-5.82 years ago, now normal.  She consumes excess starches/sugar.  She is at risk for type 2 diabetes mellitus with a BMI of 43.  6. Osteopenia, unspecified location Tanya [name]'s last DEXA scan was 09/17/2019, T score -1.2.  She is on calcium and vitamin D.  7. Anxiety and depression Tanya [name]  is on Lexapro 10 mg daily and Wellbutrin XL 150 mg daily.  She notes emotional eating.  Her PHQ-9 score is 24.  Assessment/Plan:   1. Other fatigue Tanya [name] does feel that her weight is causing her energy to be lower than it should be. Fatigue may be related to Tanya, depression or many other causes. Labs will be ordered, and in the meanwhile, Tanya [name] will focus on self care including making healthy food choices, increasing physical activity and focusing on  stress reduction.  - EKG 12-Lead - TSH - T4, free - Vitamin B12 - CBC with Differential/Platelet  2. SOBOE (shortness of breath on exertion) Tanya [name] does feel that she gets out of breath more easily that she used to when she exercises. Tanya [name]'s shortness of breath appears to be Tanya related and exercise induced. She has agreed to work on weight loss and gradually increase exercise to treat her exercise induced shortness of breath. Will continue to monitor closely.  3. Hyperlipidemia, unspecified hyperlipidemia type Tanya [name] will continue simvastatin daily.  4. Poor sleep Tanya [name] we will plan to work on sleep hygiene and stress reduction.  5. Prediabetes We will check labs today, and we will follow-up at [facility] next visit.  - Insulin, random - Hemoglobin A1c  6. Osteopenia, unspecified location We will check labs today, and we will follow-up at [facility] next visit.  - VITAMIN D 25 Hydroxy (Vit-D Deficiency, Fractures)  7. Anxiety and depression Tanya [name] will consider adding cognitive behavioral therapy.  She will continue her medications and will follow-up with her PCP.  8. Tanya,Current BMI 43.2 Tanya [name] is currently in the action stage of change and her goal is to continue with weight loss efforts. I recommend Tanya [name] begin the structured treatment plan as follows:  She has agreed to the Category 1 Plan.  Exercise goals: Track daily steps, set a goal for 6,000+ steps daily.  Behavioral modification strategies: increasing lean protein intake, increasing vegetables, increasing water intake, decreasing eating out, no skipping meals, keeping healthy foods in the home, better snacking choices, and decreasing junk food.  She was informed of the importance of frequent follow-up visits to maximize her success with intensive lifestyle modifications for her multiple health conditions. She was informed we would discuss her lab results at her next visit unless there is a critical issue  that needs to be addressed sooner. Tanya [name] agreed to keep her next visit at the agreed upon time to discuss these results.  Objective:   Blood pressure 125/77, pulse 71, temperature (!) 97.5 F (36.4 C), height '5\' 4"'$  (1.626 m), weight 251 lb (113.9 kg), SpO2 99 %. Body mass index is 43.08 kg/m.  EKG: Normal sinus rhythm, rate 73 BPM.  Indirect Calorimeter completed today shows a VO2 of 211 and a REE of 1454.  Her calculated basal metabolic rate is 6962 thus her basal metabolic rate is worse than expected.  General: Cooperative, alert, well developed, in no acute distress. HEENT: Conjunctivae and lids unremarkable. Cardiovascular: Regular rhythm.  Lungs: Normal work of breathing. Neurologic: No focal deficits.   Lab Results  Component Value Date   CREATININE 0.87 11/16/2021   BUN 19 11/16/2021   NA 137 11/16/2021   K 4.4 11/16/2021   CL 100 11/16/2021   CO2 29 11/16/2021   Lab Results  Component Value Date   ALT 14 11/16/2021   AST 17 11/16/2021   ALKPHOS 69 11/16/2021   BILITOT 0.6 11/16/2021   Lab Results  Component Value Date   HGBA1C 5.7 (  H) 03/14/2022   HGBA1C 5.6 11/16/2021   HGBA1C 5.4 01/28/2019   HGBA1C 5.5 09/15/2018   HGBA1C 5.7 09/05/2017   Lab Results  Component Value Date   INSULIN 10.1 03/14/2022   Lab Results  Component Value Date   TSH 2.310 03/14/2022   Lab Results  Component Value Date   CHOL 187 11/16/2021   HDL 64.70 11/16/2021   LDLCALC 100 (H) 11/16/2021   LDLDIRECT 148.0 07/10/2010   TRIG 112.0 11/16/2021   CHOLHDL 3 11/16/2021   Lab Results  Component Value Date   WBC 6.5 03/14/2022   HGB 14.5 03/14/2022   HCT 43.7 03/14/2022   MCV 93 03/14/2022   PLT 349 03/14/2022   No results found for: "IRON", "TIBC", "FERRITIN" Tanya Behavioral Intervention:   Approximately 15 minutes were spent on the discussion below.  ASK: We discussed the diagnosis of Tanya with Idania today and Neita agreed to give [place] permission to discuss  Tanya behavioral modification therapy today.  ASSESS: Aizlyn has the diagnosis of Tanya and her BMI today is 43.2. Jazalynn is in the action stage of change.   ADVISE: Lyvia was educated on the multiple health risks of Tanya as well as the benefit of weight loss to improve her health. She was advised of the need for long term treatment and the importance of lifestyle modifications to improve her current health and to decrease her risk of future health problems.  AGREE: Multiple dietary modification options and treatment options were discussed and Lakeidra agreed to follow the recommendations documented in the above note.  ARRANGE: Deema was educated on the importance of frequent visits to treat Tanya as outlined per CMS and USPSTF guidelines and agreed to schedule her next follow up appointment today.  Attestation Statements:   Reviewed by clinician on day of visit: allergies, medications, problem list, medical history, surgical history, family history, social history, and previous encounter notes.   Wilhemena Durie, am acting as transcriptionist for Loyal Gambler, DO.  I have reviewed the above documentation for accuracy and completeness, and I agree with the above. Dell Ponto, DO

## 2022-03-28 ENCOUNTER — Encounter (INDEPENDENT_AMBULATORY_CARE_PROVIDER_SITE_OTHER): Payer: Self-pay | Admitting: Family Medicine

## 2022-03-28 ENCOUNTER — Ambulatory Visit (INDEPENDENT_AMBULATORY_CARE_PROVIDER_SITE_OTHER): Payer: Medicare HMO | Admitting: Family Medicine

## 2022-03-28 VITALS — BP 138/86 | HR 78 | Temp 97.6°F | Ht 64.0 in | Wt 246.0 lb

## 2022-03-28 DIAGNOSIS — E669 Obesity, unspecified: Secondary | ICD-10-CM

## 2022-03-28 DIAGNOSIS — F329 Major depressive disorder, single episode, unspecified: Secondary | ICD-10-CM | POA: Insufficient documentation

## 2022-03-28 DIAGNOSIS — E559 Vitamin D deficiency, unspecified: Secondary | ICD-10-CM

## 2022-03-28 DIAGNOSIS — R7303 Prediabetes: Secondary | ICD-10-CM | POA: Diagnosis not present

## 2022-03-28 DIAGNOSIS — Z6841 Body Mass Index (BMI) 40.0 and over, adult: Secondary | ICD-10-CM

## 2022-03-28 DIAGNOSIS — E509 Vitamin A deficiency, unspecified: Secondary | ICD-10-CM

## 2022-03-29 DIAGNOSIS — J300 Vasomotor rhinitis: Secondary | ICD-10-CM | POA: Diagnosis not present

## 2022-04-02 NOTE — Progress Notes (Signed)
Chief Complaint:   OBESITY Tanya [name] is here to discuss her progress with her obesity treatment plan along with follow-up of her obesity related diagnoses. Tanya [name] is on the Category 1 Plan and states she is following her eating plan approximately 90% of the time. Tanya [name] states she is walking 30 minutes 5 times per week.  Today's visit was #: 2 Starting weight: 251 lbs Starting date: 03/14/2022 Today's weight: 246 lbs Today's date: 03/28/2022 Total lbs lost to date: 5 lbs Total lbs lost since last in-office visit: 5 lbs  Interim History: feeling adequate full with meals.  Denies hunger or cravings.  Has good support system.  Boss died this year, this has been hard.  Drinks water, off SSB's.  Started tracking steps with smart watch.   Subjective:   1. Vitamin D deficiency On OTC Vitamin, 2,000 IU daily.  Has osteopenia.   2. Prediabetes New diagnosis.  Discussed labs with patient today. A1c 5.7.  At risk for T2DM with BMI >40 and excess sugar consumption.   3. Major depressive disorder, remission status unspecified, unspecified whether recurrent On Lexapro 10 mg and Wellbutrin XL 150 mg daily. Stress levels have been higher has good support system.    Assessment/Plan:   1. Vitamin D deficiency Increase OTC Vitamin D to 4,000 IU daily  2. Prediabetes Recheck in 3-4 months.  Actively working on a low sugar, lower carb, higher protein diet, weight loss, increasing walking time.   3. Major depressive disorder, remission status unspecified, unspecified whether recurrent Continue current medications.  Consider increasing Wellbutrin  4. Obesity,current BMI 42.3 Protein alternatives to meat handout given.   Tanya [name] is currently in the action stage of change. As such, her goal is to continue with weight loss efforts. She has agreed to the Category 1 Plan.   Exercise goals:  track steps >7,000, steps per day goal.   Behavioral modification strategies: increasing lean protein  intake, increasing vegetables, increasing water intake, decreasing eating out, no skipping meals, keeping healthy foods in the home, emotional eating strategies, and avoiding temptations.  Tanya [name] has agreed to follow-up with our clinic in 3 weeks. She was informed of the importance of frequent follow-up visits to maximize her success with intensive lifestyle modifications for her multiple health conditions.   Objective:   Blood pressure 138/86, pulse 78, temperature 97.6 F (36.4 C), height '5\' 4"'$  (1.626 m), weight 246 lb (111.6 kg), SpO2 97 %. Body mass index is 42.23 kg/m.  General: Cooperative, alert, well developed, in no acute distress. HEENT: Conjunctivae and lids unremarkable. Cardiovascular: Regular rhythm.  Lungs: Normal work of breathing. Neurologic: No focal deficits.   Lab Results  Component Value Date   CREATININE 0.87 11/16/2021   BUN 19 11/16/2021   NA 137 11/16/2021   K 4.4 11/16/2021   CL 100 11/16/2021   CO2 29 11/16/2021   Lab Results  Component Value Date   ALT 14 11/16/2021   AST 17 11/16/2021   ALKPHOS 69 11/16/2021   BILITOT 0.6 11/16/2021   Lab Results  Component Value Date   HGBA1C 5.7 (H) 03/14/2022   HGBA1C 5.6 11/16/2021   HGBA1C 5.4 01/28/2019   HGBA1C 5.5 09/15/2018   HGBA1C 5.7 09/05/2017   Lab Results  Component Value Date   INSULIN 10.1 03/14/2022   Lab Results  Component Value Date   TSH 2.310 03/14/2022   Lab Results  Component Value Date   CHOL 187 11/16/2021   HDL 64.70 11/16/2021   LDLCALC  100 (H) 11/16/2021   LDLDIRECT 148.0 07/10/2010   TRIG 112.0 11/16/2021   CHOLHDL 3 11/16/2021   Lab Results  Component Value Date   VD25OH 32.5 03/14/2022   VD25OH 46.78 12/17/2019   VD25OH 47.56 09/05/2017   Lab Results  Component Value Date   WBC 6.5 03/14/2022   HGB 14.5 03/14/2022   HCT 43.7 03/14/2022   MCV 93 03/14/2022   PLT 349 03/14/2022   No results found for: "IRON", "TIBC", "FERRITIN"  Obesity Behavioral  Intervention:   Approximately 15 minutes were spent on the discussion below.  ASK: We discussed the diagnosis of obesity with Tanya [name] today and Tanya [name] agreed to give [place] permission to discuss obesity behavioral modification therapy today.  ASSESS: Tanya [name] has the diagnosis of obesity and her BMI today is 42.3. Tanya [name] is in the action stage of change.   ADVISE: Tanya [name] was educated on the multiple health risks of obesity as well as the benefit of weight loss to improve her health. She was advised of the need for long term treatment and the importance of lifestyle modifications to improve her current health and to decrease her risk of future health problems.  AGREE: Multiple dietary modification options and treatment options were discussed and Tanya [name] agreed to follow the recommendations documented in the above note.  ARRANGE: Tanya [name] was educated on the importance of frequent visits to treat obesity as outlined per CMS and USPSTF guidelines and agreed to schedule her next follow up appointment today.  Attestation Statements:   Reviewed by clinician on day of visit: allergies, medications, problem list, medical history, surgical history, family history, social history, and previous encounter notes.  I, Davy Pique, am acting as Location manager for Tanya Gambler, DO.  I have reviewed the above documentation for accuracy and completeness, and I agree with the above. Dell Ponto, DO

## 2022-04-18 ENCOUNTER — Ambulatory Visit (INDEPENDENT_AMBULATORY_CARE_PROVIDER_SITE_OTHER): Payer: Medicare HMO | Admitting: Family Medicine

## 2022-04-18 ENCOUNTER — Encounter (INDEPENDENT_AMBULATORY_CARE_PROVIDER_SITE_OTHER): Payer: Self-pay | Admitting: Family Medicine

## 2022-04-18 VITALS — BP 122/84 | HR 79 | Temp 97.6°F | Ht 64.0 in | Wt 241.0 lb

## 2022-04-18 DIAGNOSIS — E559 Vitamin D deficiency, unspecified: Secondary | ICD-10-CM

## 2022-04-18 DIAGNOSIS — E669 Obesity, unspecified: Secondary | ICD-10-CM | POA: Diagnosis not present

## 2022-04-18 DIAGNOSIS — F329 Major depressive disorder, single episode, unspecified: Secondary | ICD-10-CM

## 2022-04-18 DIAGNOSIS — Z6841 Body Mass Index (BMI) 40.0 and over, adult: Secondary | ICD-10-CM | POA: Diagnosis not present

## 2022-04-18 DIAGNOSIS — R7303 Prediabetes: Secondary | ICD-10-CM | POA: Diagnosis not present

## 2022-04-25 NOTE — Progress Notes (Signed)
Chief Complaint:   OBESITY Tanya [name] is here to discuss her progress with her obesity treatment plan along with follow-up of her obesity related diagnoses. Tanya [name] is on the Category 1 Plan and states she is following her eating plan approximately 90% of the time. Tanya [name] states she is doing some walking.  Today's visit was #: 3 Starting weight: 251 lbs Starting date: 03/14/2022 Today's weight: 241 lbs Today's date: 04/18/2022 Total lbs lost to date: 10 lbs Total lbs lost since last in-office visit: 5 lbs  Interim History: Less time for walking, closing down business.  Stress levels higher.  She has been doing well on meal.  Denies meal skipping.  Denies hunger or cravings.  Hydrating well with water.  Thinking about the [employer] or using bike.   Subjective:   1. Vitamin D deficiency On Vitamin D, 4,000 IU daily, increased from 2,000 IU.    2. Pre-diabetes A1c 5.7.  Denies sugar cravings.   3. Major depressive disorder, remission status unspecified, unspecified whether recurrent She is on Lexapro and Wellbutrin.  Reports stable mood.  Has grieved loss of boss.  She has a good support system.    Assessment/Plan:   1. Vitamin D deficiency Recheck Vitamin D levels in 2 months.   2. Pre-diabetes Recheck A1c in 2 months.   3. Major depressive disorder, remission status unspecified, unspecified whether recurrent Continue current medications per PCP.   4. Obesity,curret BMI 41.5 Increase water intake to >64 oz per day.   Tanya [name] is currently in the action stage of change. As such, her goal is to continue with weight loss efforts. She has agreed to the Category 1 Plan.   Exercise goals:  Increase walking outdoors to 2-3 days per week.   Behavioral modification strategies: increasing lean protein intake, increasing vegetables, increasing water intake, decreasing eating out, no skipping meals, meal planning and cooking strategies, keeping healthy foods in the home, better snacking  choices, and decreasing junk food.  Tanya [name] has agreed to follow-up with our clinic in 3 weeks. She was informed of the importance of frequent follow-up visits to maximize her success with intensive lifestyle modifications for her multiple health conditions.   Objective:   Blood pressure 122/84, pulse 79, temperature 97.6 F (36.4 C), height '5\' 4"'$  (1.626 m), weight 241 lb (109.3 kg), SpO2 98 %. Body mass index is 41.37 kg/m.  General: Cooperative, alert, well developed, in no acute distress. HEENT: Conjunctivae and lids unremarkable. Cardiovascular: Regular rhythm.  Lungs: Normal work of breathing. Neurologic: No focal deficits.   Lab Results  Component Value Date   CREATININE 0.87 11/16/2021   BUN 19 11/16/2021   NA 137 11/16/2021   K 4.4 11/16/2021   CL 100 11/16/2021   CO2 29 11/16/2021   Lab Results  Component Value Date   ALT 14 11/16/2021   AST 17 11/16/2021   ALKPHOS 69 11/16/2021   BILITOT 0.6 11/16/2021   Lab Results  Component Value Date   HGBA1C 5.7 (H) 03/14/2022   HGBA1C 5.6 11/16/2021   HGBA1C 5.4 01/28/2019   HGBA1C 5.5 09/15/2018   HGBA1C 5.7 09/05/2017   Lab Results  Component Value Date   INSULIN 10.1 03/14/2022   Lab Results  Component Value Date   TSH 2.310 03/14/2022   Lab Results  Component Value Date   CHOL 187 11/16/2021   HDL 64.70 11/16/2021   LDLCALC 100 (H) 11/16/2021   LDLDIRECT 148.0 07/10/2010   TRIG 112.0 11/16/2021   CHOLHDL 3  11/16/2021   Lab Results  Component Value Date   VD25OH 32.5 03/14/2022   VD25OH 46.78 12/17/2019   VD25OH 47.56 09/05/2017   Lab Results  Component Value Date   WBC 6.5 03/14/2022   HGB 14.5 03/14/2022   HCT 43.7 03/14/2022   MCV 93 03/14/2022   PLT 349 03/14/2022   No results found for: "IRON", "TIBC", "FERRITIN"  Obesity Behavioral Intervention:   Approximately 15 minutes were spent on the discussion below.  ASK: We discussed the diagnosis of obesity with Tanya [name] today and Tanya [name]  agreed to give [place] permission to discuss obesity behavioral modification therapy today.  ASSESS: Tanya [name] has the diagnosis of obesity and her BMI today is 41.5. Tanya [name] is in the action stage of change.   ADVISE: Tanya [name] was educated on the multiple health risks of obesity as well as the benefit of weight loss to improve her health. She was advised of the need for long term treatment and the importance of lifestyle modifications to improve her current health and to decrease her risk of future health problems.  AGREE: Multiple dietary modification options and treatment options were discussed and Tanya [name] agreed to follow the recommendations documented in the above note.  ARRANGE: Tanya [name] was educated on the importance of frequent visits to treat obesity as outlined per CMS and USPSTF guidelines and agreed to schedule her next follow up appointment today.  Attestation Statements:   Reviewed by clinician on day of visit: allergies, medications, problem list, medical history, surgical history, family history, social history, and previous encounter notes.  Time spent on visit including pre-visit chart review and post-visit care and charting was 30 minutes.   I, Davy Pique, am acting as Location manager for Loyal Gambler, DO.  I have reviewed the above documentation for accuracy and completeness, and I agree with the above. Dell Ponto, DO

## 2022-05-13 ENCOUNTER — Other Ambulatory Visit: Payer: Self-pay

## 2022-05-13 ENCOUNTER — Telehealth: Payer: Self-pay | Admitting: Internal Medicine

## 2022-05-13 MED ORDER — BUPROPION HCL ER (XL) 150 MG PO TB24: 150.0000 mg | ORAL_TABLET | Freq: Every day | ORAL | 1 refills | Status: DC

## 2022-05-13 NOTE — Telephone Encounter (Signed)
Spoke with patient today.  She needed refill on Wellbutrin.  Script sent in today

## 2022-05-13 NOTE — Telephone Encounter (Signed)
Caller & Relationship to patient: PT  Call back number: [phone]  Date of last office visit: 11/16/2021  Date of next office visit: 05/21/2022  Medication(s) to be refilled:  Alprazolam Duanne Moron) (I could not find this medication within patient's chart)      Preferred Pharmacy:   [facility] if possible (This pharmacy is not on PT's chart).  PT is down to their last 2 pills.

## 2022-05-14 ENCOUNTER — Encounter (INDEPENDENT_AMBULATORY_CARE_PROVIDER_SITE_OTHER): Payer: Self-pay | Admitting: Family Medicine

## 2022-05-14 ENCOUNTER — Ambulatory Visit (INDEPENDENT_AMBULATORY_CARE_PROVIDER_SITE_OTHER): Payer: Medicare HMO | Admitting: Family Medicine

## 2022-05-14 VITALS — BP 131/82 | HR 75 | Temp 97.5°F | Ht 64.0 in | Wt 237.0 lb

## 2022-05-14 DIAGNOSIS — E559 Vitamin D deficiency, unspecified: Secondary | ICD-10-CM | POA: Diagnosis not present

## 2022-05-14 DIAGNOSIS — E669 Obesity, unspecified: Secondary | ICD-10-CM

## 2022-05-14 DIAGNOSIS — R7303 Prediabetes: Secondary | ICD-10-CM | POA: Diagnosis not present

## 2022-05-14 DIAGNOSIS — Z6841 Body Mass Index (BMI) 40.0 and over, adult: Secondary | ICD-10-CM | POA: Diagnosis not present

## 2022-05-19 ENCOUNTER — Encounter: Payer: Self-pay | Admitting: Internal Medicine

## 2022-05-19 NOTE — Patient Instructions (Addendum)
Blood work was ordered.   The lab is on the first floor.    Medications changes include :   None     Return in about 6 months (around 11/19/2022) for follow up.   Health Maintenance, Female Adopting a healthy lifestyle and getting preventive care are important in promoting health and wellness. Ask your health care provider about: The right schedule for you to have regular tests and exams. Things you can do on your own to prevent diseases and keep yourself healthy. What should I know about diet, weight, and exercise? Eat a healthy diet  Eat a diet that includes plenty of vegetables, fruits, low-fat dairy products, and lean protein. Do not eat a lot of foods that are high in solid fats, added sugars, or sodium. Maintain a healthy weight Body mass index (BMI) is used to identify weight problems. It estimates body fat based on height and weight. Your health care provider can help determine your BMI and help you achieve or maintain a healthy weight. Get regular exercise Get regular exercise. This is one of the most important things you can do for your health. Most adults should: Exercise for at least 150 minutes each week. The exercise should increase your heart rate and make you sweat (moderate-intensity exercise). Do strengthening exercises at least twice a week. This is in addition to the moderate-intensity exercise. Spend less time sitting. Even light physical activity can be beneficial. Watch cholesterol and blood lipids Have your blood tested for lipids and cholesterol at 78 years of age, then have this test every 5 years. Have your cholesterol levels checked more often if: Your lipid or cholesterol levels are high. You are older than 78 years of age. You are at high risk for heart disease. What should I know about cancer screening? Depending on your health history and family history, you may need to have cancer screening at various ages. This may include screening  for: Breast cancer. Cervical cancer. Colorectal cancer. Skin cancer. Lung cancer. What should I know about heart disease, diabetes, and high blood pressure? Blood pressure and heart disease High blood pressure causes heart disease and increases the risk of stroke. This is more likely to develop in people who have high blood pressure readings or are overweight. Have your blood pressure checked: Every 3-5 years if you are 39-104 years of age. Every year if you are 52 years old or older. Diabetes Have regular diabetes screenings. This checks your fasting blood sugar level. Have the screening done: Once every three years after age 64 if you are at a normal weight and have a low risk for diabetes. More often and at a younger age if you are overweight or have a high risk for diabetes. What should I know about preventing infection? Hepatitis B If you have a higher risk for hepatitis B, you should be screened for this virus. Talk with your health care provider to find out if you are at risk for hepatitis B infection. Hepatitis C Testing is recommended for: Everyone born from [birth year] through 1967. Anyone with known risk factors for hepatitis C. Sexually transmitted infections (STIs) Get screened for STIs, including gonorrhea and chlamydia, if: You are sexually active and are younger than 78 years of age. You are older than 78 years of age and your health care provider tells you that you are at risk for this type of infection. Your sexual activity has changed since you were last screened, and you are at  increased risk for chlamydia or gonorrhea. Ask your health care provider if you are at risk. Ask your health care provider about whether you are at high risk for HIV. Your health care provider may recommend a prescription medicine to help prevent HIV infection. If you choose to take medicine to prevent HIV, you should first get tested for HIV. You should then be tested every 3 months for as long as you  are taking the medicine. Pregnancy If you are about to stop having your period (premenopausal) and you may become pregnant, seek counseling before you get pregnant. Take 400 to 800 micrograms (mcg) of folic acid every day if you become pregnant. Ask for birth control (contraception) if you want to prevent pregnancy. Osteoporosis and menopause Osteoporosis is a disease in which the bones lose minerals and strength with aging. This can result in bone fractures. If you are 62 years old or older, or if you are at risk for osteoporosis and fractures, ask your health care provider if you should: Be screened for bone loss. Take a calcium or vitamin D supplement to lower your risk of fractures. Be given hormone replacement therapy (HRT) to treat symptoms of menopause. Follow these instructions at home: Alcohol use Do not drink alcohol if: Your health care provider tells you not to drink. You are pregnant, may be pregnant, or are planning to become pregnant. If you drink alcohol: Limit how much you have to: 0-1 drink a day. Know how much alcohol is in your drink. In the [place] one drink equals one 12 oz bottle of beer (355 mL), one 5 oz glass of wine (148 mL), or one 1 oz glass of hard liquor (44 mL). Lifestyle Do not use any products that contain nicotine or tobacco. These products include cigarettes, chewing tobacco, and vaping devices, such as e-cigarettes. If you need help quitting, ask your health care provider. Do not use street drugs. Do not share needles. Ask your health care provider for help if you need support or information about quitting drugs. General instructions Schedule regular health, dental, and eye exams. Stay current with your vaccines. Tell your health care provider if: You often feel depressed. You have ever been abused or do not feel safe at home. Summary Adopting a healthy lifestyle and getting preventive care are important in promoting health and wellness. Follow your  health care provider's instructions about healthy diet, exercising, and getting tested or screened for diseases. Follow your health care provider's instructions on monitoring your cholesterol and blood pressure. This information is not intended to replace advice given to you by your health care provider. Make sure you discuss any questions you have with your health care provider. Document Revised: 11/20/2020 Document Reviewed: 11/20/2020 Elsevier Patient Education  [employer].

## 2022-05-19 NOTE — Progress Notes (Unsigned)
Subjective:    Patient ID: Tanya [name], female    DOB: [birth year], 76 y.o.   MRN: [id number]      HPI Jett is here for a Physical exam.    She is going to healthy weight and wellness.  She has lost weight.  Overall she feels pretty well.   Medications and allergies reviewed with patient and updated if appropriate.  Current Outpatient Medications on File Prior to Visit  Medication Sig Dispense Refill   Azelastine HCl 0.15 % SOLN Place into the nose in the morning and at bedtime.     buPROPion (WELLBUTRIN XL) 150 MG 24 hr tablet Take 1 tablet (150 mg total) by mouth daily. 90 tablet 1   Calcium Carb-Cholecalciferol (CALCIUM/VITAMIN D) 500-200 MG-UNIT TABS Take 1 tablet by mouth daily.     cetirizine (ZYRTEC) 10 MG tablet TAKE 1 TABLET (10 MG TOTAL) BY MOUTH DAILY. KEEP SCHEDULED APPT FOR FUTURE REFILLS 90 tablet 0   dimenhyDRINATE (DRAMAMINE) 50 MG tablet Take 50 mg by mouth every 8 (eight) hours as needed for dizziness.     escitalopram (LEXAPRO) 10 MG tablet TAKE 1 TABLET BY MOUTH EVERY DAY 90 tablet 1   famotidine (PEPCID) 40 MG tablet Take 1 tablet (40 mg total) by mouth daily. 90 tablet 3   ipratropium (ATROVENT) 0.06 % nasal spray Place 1 spray into both nostrils 2 (two) times a day.      levocetirizine (XYZAL) 5 MG tablet TAKE 1 TABLET BY MOUTH EVERY DAY IN THE EVENING 90 tablet 2   meclizine (ANTIVERT) 12.5 MG tablet Take 1 tablet (12.5 mg total) by mouth 3 (three) times daily as needed for dizziness. 15 tablet 0   metroNIDAZOLE (METROCREAM) 0.75 % cream      Multiple Vitamin (MULTIVITAMIN) tablet Take 1 tablet by mouth daily.     simvastatin (ZOCOR) 20 MG tablet TAKE 1 TABLET BY MOUTH DAILY IN THE EVENING. 90 tablet 1   temazepam (RESTORIL) 15 MG capsule Take 1 capsule (15 mg total) by mouth at bedtime as needed for sleep. (Patient not taking: Reported on 05/21/2022) 30 capsule 0   No current facility-administered medications on file prior to visit.    Review of  Systems  Constitutional:  Negative for fever.  Eyes:  Negative for visual disturbance.  Respiratory:  Negative for cough, shortness of breath and wheezing.   Cardiovascular:  Negative for chest pain, palpitations and leg swelling.  Gastrointestinal:  Positive for constipation (metamucil qod). Negative for abdominal pain, blood in stool, diarrhea and nausea.  Genitourinary:  Negative for dysuria.  Musculoskeletal:  Negative for arthralgias and back pain.  Skin:  Negative for rash.  Neurological:  Positive for dizziness (occ, better). Negative for light-headedness and headaches.  Psychiatric/Behavioral:  Positive for dysphoric mood. The patient is nervous/anxious.        Objective:   Vitals:   05/21/22 0805  BP: 136/80  Pulse: 72  Temp: 98 F (36.7 C)  SpO2: 99%   Filed Weights   05/21/22 0805  Weight: 244 lb (110.7 kg)   Body mass index is 41.88 kg/m.  BP Readings from Last 3 Encounters:  05/21/22 136/80  05/14/22 131/82  04/18/22 122/84    Wt Readings from Last 3 Encounters:  05/21/22 244 lb (110.7 kg)  05/14/22 237 lb (107.5 kg)  04/18/22 241 lb (109.3 kg)       Physical Exam Constitutional: She appears well-developed and well-nourished. No distress.  HENT:  Head: Normocephalic and  atraumatic.  Right Ear: External ear normal. Normal ear canal and TM Left Ear: External ear normal.  Normal ear canal and TM Mouth/Throat: Oropharynx is clear and moist.  Eyes: Conjunctivae normal.  Neck: Neck supple. No tracheal deviation present. No thyromegaly present.  No carotid bruit  Cardiovascular: Normal rate, regular rhythm and normal heart sounds.   No murmur heard.  No edema. Pulmonary/Chest: Effort normal and breath sounds normal. No respiratory distress. She has no wheezes. She has no rales.  Breast: deferred   Abdominal: Soft. She exhibits no distension. There is no tenderness.  Lymphadenopathy: She has no cervical adenopathy.  Skin: Skin is warm and dry. She is  not diaphoretic.  Psychiatric: She has a normal mood and affect. Her behavior is normal.     Lab Results  Component Value Date   WBC 6.5 03/14/2022   HGB 14.5 03/14/2022   HCT 43.7 03/14/2022   PLT 349 03/14/2022   GLUCOSE 85 11/16/2021   CHOL 187 11/16/2021   TRIG 112.0 11/16/2021   HDL 64.70 11/16/2021   LDLDIRECT 148.0 07/10/2010   LDLCALC 100 (H) 11/16/2021   ALT 14 11/16/2021   AST 17 11/16/2021   NA 137 11/16/2021   K 4.4 11/16/2021   CL 100 11/16/2021   CREATININE 0.87 11/16/2021   BUN 19 11/16/2021   CO2 29 11/16/2021   TSH 2.310 03/14/2022   INR 1.0 01/28/2019   HGBA1C 5.7 (H) 03/14/2022         Assessment & Plan:   Physical exam: Screening blood work  ordered Exercise  trying to get 7000 steps/ day,  has exercise bike Weight   encouraged weight loss Substance abuse  none   Reviewed recommended immunizations.  Flu immunization administered today.     Health Maintenance  Topic Date Due   COVID-19 Vaccine (4 - Pfizer series) 05/17/2020   DEXA SCAN  09/16/2021   INFLUENZA VACCINE  02/12/2022   Medicare Annual Wellness (AWV)  08/24/2022   TETANUS/TDAP  01/04/2030   Pneumonia Vaccine 59+ Years old  Completed   Hepatitis C Screening  Completed   Zoster Vaccines- Shingrix  Completed   HPV VACCINES  Aged Out   COLONOSCOPY (Pts 45-82yr Insurance coverage will need to be confirmed)  Discontinued          See Problem List for Assessment and Plan of chronic medical problems.

## 2022-05-21 ENCOUNTER — Ambulatory Visit (INDEPENDENT_AMBULATORY_CARE_PROVIDER_SITE_OTHER): Payer: Medicare HMO | Admitting: Internal Medicine

## 2022-05-21 VITALS — BP 136/80 | HR 72 | Temp 98.0°F | Ht 64.0 in | Wt 244.0 lb

## 2022-05-21 DIAGNOSIS — G4709 Other insomnia: Secondary | ICD-10-CM | POA: Diagnosis not present

## 2022-05-21 DIAGNOSIS — M85852 Other specified disorders of bone density and structure, left thigh: Secondary | ICD-10-CM

## 2022-05-21 DIAGNOSIS — F32A Depression, unspecified: Secondary | ICD-10-CM | POA: Diagnosis not present

## 2022-05-21 DIAGNOSIS — E559 Vitamin D deficiency, unspecified: Secondary | ICD-10-CM | POA: Diagnosis not present

## 2022-05-21 DIAGNOSIS — R7303 Prediabetes: Secondary | ICD-10-CM

## 2022-05-21 DIAGNOSIS — E7849 Other hyperlipidemia: Secondary | ICD-10-CM

## 2022-05-21 DIAGNOSIS — M85851 Other specified disorders of bone density and structure, right thigh: Secondary | ICD-10-CM | POA: Diagnosis not present

## 2022-05-21 DIAGNOSIS — Z0000 Encounter for general adult medical examination without abnormal findings: Secondary | ICD-10-CM | POA: Diagnosis not present

## 2022-05-21 DIAGNOSIS — K219 Gastro-esophageal reflux disease without esophagitis: Secondary | ICD-10-CM | POA: Diagnosis not present

## 2022-05-21 DIAGNOSIS — F419 Anxiety disorder, unspecified: Secondary | ICD-10-CM

## 2022-05-21 DIAGNOSIS — Z23 Encounter for immunization: Secondary | ICD-10-CM

## 2022-05-21 NOTE — Assessment & Plan Note (Signed)
Chronic °Regular exercise and healthy diet encouraged °Check lipid panel  °Continue simvastatin 20 mg daily °

## 2022-05-21 NOTE — Assessment & Plan Note (Signed)
Chronic GERD controlled Continue famotidine 40 mg daily 

## 2022-05-21 NOTE — Assessment & Plan Note (Signed)
Chronic Taking vitamin D daily Check vitamin D level  

## 2022-05-21 NOTE — Assessment & Plan Note (Signed)
Chronic DEXA due-ordered Stressed regular exercise Continue calcium and vitamin D-check vitamin D level

## 2022-05-21 NOTE — Assessment & Plan Note (Signed)
Chronic Controlled, Stable Continue bupropion Salant 50 mg daily, Lexapro 10 mg daily

## 2022-05-21 NOTE — Assessment & Plan Note (Signed)
Chronic Check a1c Low sugar / carb diet Stressed regular exercise  

## 2022-05-21 NOTE — Assessment & Plan Note (Addendum)
Chronic Controlled, Stable Not taking temazepam 15 mg nightly -- did not work well -- taking on occasion

## 2022-05-21 NOTE — Addendum Note (Signed)
Addended by: Marcina Millard on: 05/21/2022 04:45 PM   Modules accepted: Orders

## 2022-05-22 LAB — COMPREHENSIVE METABOLIC PANEL
ALT: 17 U/L (ref 0–35)
AST: 18 U/L (ref 0–37)
Albumin: 4.2 g/dL (ref 3.5–5.2)
Alkaline Phosphatase: 57 U/L (ref 39–117)
BUN: 19 mg/dL (ref 6–23)
CO2: 29 mEq/L (ref 19–32)
Calcium: 9 mg/dL (ref 8.4–10.5)
Chloride: 103 mEq/L (ref 96–112)
Creatinine, Ser: 0.84 mg/dL (ref 0.40–1.20)
GFR: 66.44 mL/min (ref >60.00)
Glucose, Bld: 92 mg/dL (ref 70–99)
Potassium: 4.2 mEq/L (ref 3.5–5.1)
Sodium: 139 mEq/L (ref 135–145)
Total Bilirubin: 0.4 mg/dL (ref 0.2–1.2)
Total Protein: 6.8 g/dL (ref 6.0–8.3)

## 2022-05-22 LAB — CBC WITH DIFFERENTIAL/PLATELET
Basophils Absolute: 0.1 10*3/uL (ref 0.0–0.1)
Basophils Relative: 1.4 % (ref 0.0–3.0)
Eosinophils Absolute: 0.2 10*3/uL (ref 0.0–0.7)
Eosinophils Relative: 3 % (ref 0.0–5.0)
HCT: 43.1 % (ref 36.0–46.0)
Hemoglobin: 14.3 g/dL (ref 12.0–15.0)
Lymphocytes Relative: 24 % (ref 12.0–46.0)
Lymphs Abs: 1.4 10*3/uL (ref 0.7–4.0)
MCHC: 33.1 g/dL (ref 30.0–36.0)
MCV: 91.2 fl (ref 78.0–100.0)
Monocytes Absolute: 0.6 10*3/uL (ref 0.1–1.0)
Monocytes Relative: 10.9 % (ref 3.0–12.0)
Neutro Abs: 3.5 10*3/uL (ref 1.4–7.7)
Neutrophils Relative %: 60.7 % (ref 43.0–77.0)
Platelets: 295 10*3/uL (ref 150.0–400.0)
RBC: 4.72 Mil/uL (ref 3.87–5.11)
RDW: 13.5 % (ref 11.5–15.5)
WBC: 5.8 10*3/uL (ref 4.0–10.5)

## 2022-05-22 LAB — HEMOGLOBIN A1C: Hgb A1c MFr Bld: 5.8 % (ref 4.6–6.5)

## 2022-05-22 LAB — LIPID PANEL
Cholesterol: 160 mg/dL (ref 0–200)
HDL: 59.5 mg/dL (ref >39.00)
LDL Cholesterol: 80 mg/dL (ref 0–99)
NonHDL: 100.93
Total CHOL/HDL Ratio: 3
Triglycerides: 107 mg/dL (ref 0.0–149.0)
VLDL: 21.4 mg/dL (ref 0.0–40.0)

## 2022-05-22 LAB — VITAMIN D 25 HYDROXY (VIT D DEFICIENCY, FRACTURES): VITD: 32.77 ng/mL (ref 30.00–100.00)

## 2022-05-22 LAB — TSH: TSH: 2.45 u[IU]/mL (ref 0.35–5.50)

## 2022-05-27 NOTE — Progress Notes (Unsigned)
Chief Complaint:   OBESITY Tanya [name] is here to discuss her progress with her obesity treatment plan along with follow-up of her obesity related diagnoses. Tanya [name] is on the Category 1 Plan and states she is following her eating plan approximately 85% of the time. Tanya [name] states she is biking one time per week, walking 7,000 steps per day 30 minutes 1 time per week.  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  Plans to work on improving sleep, water intake, and walking.  Has a treadmill and bike for home use.  Keeping junk food out of the house.   Subjective:   1. Pre-diabetes A1c, has reduced sugar intake and increased walking time.   2. Vitamin D deficiency Increased Vitamin D to 4,000 IU daily.   Assessment/Plan:   1. Pre-diabetes Recheck A1c in 5 weeks.   2. Vitamin D deficiency Recheck Vitamin D level in 5 weeks.  Continue Vitamin D 4,000 IU daily.   3. Obesity,current BMI 40.7  Reviewed progress, 5.5% total body weight loss in 2 months.  Reviewed health benefits of >5% TBW loss.   Tanya [name] is currently in the action stage of change. As such, her goal is to continue with weight loss efforts. She has agreed to the Category 1 Plan.   Exercise goals: Walking  or biking 30 minutes 4-5 a week.   Behavioral modification strategies: increasing lean protein intake, increasing water intake, decreasing eating out, no skipping meals, meal planning and cooking strategies, keeping healthy foods in the home, and planning for success.  Tanya [name] has agreed to follow-up with our clinic in 5 weeks. She was informed of the importance of frequent follow-up visits to maximize her success with intensive lifestyle modifications for her multiple health conditions.    Objective:   Blood pressure 131/82, pulse 75, temperature (!) 97.5 F (36.4 C), height '5\' 4"'$  (1.626 m), weight 237 lb (107.5 kg), SpO2 97 %. Body mass index is 40.68 kg/m.  General: Cooperative, alert, well developed, in no acute distress. HEENT: Conjunctivae and lids unremarkable. Cardiovascular: Regular rhythm.  Lungs: Normal work of breathing. Neurologic: No focal deficits.   Lab Results  Component Value Date   CREATININE 0.84 05/22/2022   BUN 19 05/22/2022   NA 139 05/22/2022   K 4.2 05/22/2022   CL 103 05/22/2022   CO2 29 05/22/2022   Lab Results  Component Value Date   ALT 17 05/22/2022   AST 18 05/22/2022   ALKPHOS 57 05/22/2022   BILITOT 0.4 05/22/2022   Lab Results  Component Value Date   HGBA1C 5.8 05/22/2022   HGBA1C 5.7 (H) 03/14/2022   HGBA1C 5.6 11/16/2021   HGBA1C 5.4 01/28/2019   HGBA1C 5.5 09/15/2018   Lab Results  Component Value Date   INSULIN 10.1 03/14/2022   Lab Results  Component Value Date   TSH 2.45 05/22/2022   Lab Results  Component Value Date   CHOL 160 05/22/2022   HDL 59.50 05/22/2022   LDLCALC 80 05/22/2022   LDLDIRECT 148.0 07/10/2010   TRIG 107.0 05/22/2022   CHOLHDL 3 05/22/2022   Lab Results  Component Value Date   VD25OH 32.77 05/22/2022   VD25OH 32.5 03/14/2022   VD25OH 46.78 12/17/2019   Lab Results  Component Value Date  WBC 5.8 05/22/2022   HGB 14.3 05/22/2022   HCT 43.1 05/22/2022   MCV 91.2 05/22/2022   PLT 295.0 05/22/2022   No results found for: "IRON", "TIBC", "FERRITIN"  Attestation Statements:   Reviewed by clinician on day of visit: allergies, medications, problem list, medical history, surgical history, family history, social history, and previous encounter notes.  I, Davy Pique, am acting as Location manager for Loyal Gambler, DO.  I have reviewed the above documentation for accuracy and completeness, and I agree with the above. Dell Ponto, DO

## 2022-06-17 ENCOUNTER — Ambulatory Visit (INDEPENDENT_AMBULATORY_CARE_PROVIDER_SITE_OTHER): Payer: Medicare HMO | Admitting: Family Medicine

## 2022-06-17 ENCOUNTER — Encounter (INDEPENDENT_AMBULATORY_CARE_PROVIDER_SITE_OTHER): Payer: Self-pay | Admitting: Family Medicine

## 2022-06-17 VITALS — BP 121/83 | HR 70 | Temp 97.2°F | Ht 64.0 in | Wt 235.0 lb

## 2022-06-17 DIAGNOSIS — Z6841 Body Mass Index (BMI) 40.0 and over, adult: Secondary | ICD-10-CM | POA: Diagnosis not present

## 2022-06-17 DIAGNOSIS — E669 Obesity, unspecified: Secondary | ICD-10-CM | POA: Diagnosis not present

## 2022-06-17 DIAGNOSIS — E559 Vitamin D deficiency, unspecified: Secondary | ICD-10-CM | POA: Diagnosis not present

## 2022-06-17 DIAGNOSIS — R7303 Prediabetes: Secondary | ICD-10-CM | POA: Diagnosis not present

## 2022-06-17 MED ORDER — VITAMIN D (ERGOCALCIFEROL) 1.25 MG (50000 UNIT) PO CAPS: 50000.0000 [IU] | ORAL_CAPSULE | ORAL | 0 refills | Status: DC

## 2022-06-18 ENCOUNTER — Other Ambulatory Visit: Payer: Self-pay | Admitting: Internal Medicine

## 2022-06-18 DIAGNOSIS — M85851 Other specified disorders of bone density and structure, right thigh: Secondary | ICD-10-CM | POA: Diagnosis not present

## 2022-06-18 DIAGNOSIS — M85852 Other specified disorders of bone density and structure, left thigh: Secondary | ICD-10-CM | POA: Diagnosis not present

## 2022-06-18 LAB — HM MAMMOGRAPHY

## 2022-06-18 LAB — HM DEXA SCAN

## 2022-06-19 ENCOUNTER — Encounter: Payer: Self-pay | Admitting: Internal Medicine

## 2022-06-19 NOTE — Progress Notes (Signed)
Outside notes received. Information abstracted. Notes sent to scan.  

## 2022-06-25 NOTE — Progress Notes (Unsigned)
Chief Complaint:   OBESITY Tanya [name] is here to discuss her progress with her obesity treatment plan along with follow-up of her obesity related diagnoses. Tanya [name] is on the Category 1 Plan and states she is following her eating plan approximately 90% of the time. Tanya [name] states she is biking, counting 6000 steps per day 20 minutes 6 times per week.  Today's visit was #: 5 Starting weight: 251 LBS Starting date: 03/14/2022 Today's weight: 235 LBS Today's date: 06/17/2022 Total lbs lost to date: 16 LBS Total lbs lost since last in-office visit: 2 LBS  Interim History: Tanya [name]'s stress level is reducing and sleep is improving.  She is feeling full with category 1 meal plan.  She snacks on fruit, cheese, yogurt.  She is craving sweets after meals.  She is trying to keep sweets out of the house.  She struggles to get in enough water.  She has added time in on her bike.  Subjective:   1. Vitamin D deficiency Last vitamin D level was 32.7 on 05/22/2022.  She is taking an OTC vitamin D daily.  2. Pre-diabetes A1c updated by PCP on 05/22/2022, it is 5.8 up from 5.7.  She is actively working on reducing added sugar, weight loss, and increased exercise.  She has lost 6.3% TBW in 3 months with our program.  Assessment/Plan:   1. Vitamin D deficiency Recheck level in 3 to 4 months.  Begin- Vitamin D, Ergocalciferol, (DRISDOL) 1.25 MG (50000 UNIT) CAPS capsule; Take 1 capsule (50,000 Units total) by mouth every 7 (seven) days.  Dispense: 5 capsule; Refill: 0  2. Pre-diabetes Recheck A1c in 3 to 4 months with further weight reduction.  3. Obesity,current BMI 40.4 Increase water intake to >64 ounces per day.  Tanya [name] is currently in the action stage of change. As such, her goal is to continue with weight loss efforts. She has agreed to the Category 1 Plan.   Exercise goals:  Biking, walking, resistance bands 30 minutes 4-5 times per week  Behavioral modification strategies: increasing lean  protein intake, increasing vegetables, increasing water intake, meal planning and cooking strategies, keeping healthy foods in the home, better snacking choices, holiday eating strategies , celebration eating strategies, avoiding temptations, and planning for success.  Tanya [name] has agreed to follow-up with our clinic in 3-4 weeks. She was informed of the importance of frequent follow-up visits to maximize her success with intensive lifestyle modifications for her multiple health conditions.   Objective:   Blood pressure 121/83, pulse 70, temperature (!) 97.2 F (36.2 C), temperature source Axillary, height '5\' 4"'$  (1.626 m), weight 235 lb (106.6 kg), SpO2 99 %. Body mass index is 40.34 kg/m.  General: Cooperative, alert, well developed, in no acute distress. HEENT: Conjunctivae and lids unremarkable. Cardiovascular: Regular rhythm.  Lungs: Normal work of breathing. Neurologic: No focal deficits.   Lab Results  Component Value Date   CREATININE 0.84 05/22/2022   BUN 19 05/22/2022   NA 139 05/22/2022   K 4.2 05/22/2022   CL 103 05/22/2022   CO2 29 05/22/2022   Lab Results  Component Value Date   ALT 17 05/22/2022   AST 18 05/22/2022   ALKPHOS 57 05/22/2022   BILITOT 0.4 05/22/2022   Lab Results  Component Value Date   HGBA1C 5.8 05/22/2022   HGBA1C 5.7 (H) 03/14/2022   HGBA1C 5.6 11/16/2021   HGBA1C 5.4 01/28/2019   HGBA1C 5.5 09/15/2018   Lab Results  Component Value Date   INSULIN 10.1  03/14/2022   Lab Results  Component Value Date   TSH 2.45 05/22/2022   Lab Results  Component Value Date   CHOL 160 05/22/2022   HDL 59.50 05/22/2022   LDLCALC 80 05/22/2022   LDLDIRECT 148.0 07/10/2010   TRIG 107.0 05/22/2022   CHOLHDL 3 05/22/2022   Lab Results  Component Value Date   VD25OH 32.77 05/22/2022   VD25OH 32.5 03/14/2022   VD25OH 46.78 12/17/2019   Lab Results  Component Value Date   WBC 5.8 05/22/2022   HGB 14.3 05/22/2022   HCT 43.1 05/22/2022   MCV 91.2  05/22/2022   PLT 295.0 05/22/2022   No results found for: "IRON", "TIBC", "FERRITIN"  Attestation Statements:   Reviewed by clinician on day of visit: allergies, medications, problem list, medical history, surgical history, family history, social history, and previous encounter notes.  I, Davy Pique, am acting as Location manager for Loyal Gambler, DO.  I have reviewed the above documentation for accuracy and completeness, and I agree with the above. Dell Ponto, DO

## 2022-07-16 ENCOUNTER — Ambulatory Visit (INDEPENDENT_AMBULATORY_CARE_PROVIDER_SITE_OTHER): Payer: Medicare Other | Admitting: Family Medicine

## 2022-07-16 ENCOUNTER — Encounter (INDEPENDENT_AMBULATORY_CARE_PROVIDER_SITE_OTHER): Payer: Self-pay | Admitting: Family Medicine

## 2022-07-16 VITALS — BP 129/84 | HR 70 | Temp 98.4°F | Ht 64.0 in | Wt 233.0 lb

## 2022-07-16 DIAGNOSIS — E669 Obesity, unspecified: Secondary | ICD-10-CM

## 2022-07-16 DIAGNOSIS — E66813 Obesity, class 3: Secondary | ICD-10-CM

## 2022-07-16 DIAGNOSIS — E559 Vitamin D deficiency, unspecified: Secondary | ICD-10-CM

## 2022-07-16 DIAGNOSIS — Z6841 Body Mass Index (BMI) 40.0 and over, adult: Secondary | ICD-10-CM

## 2022-07-16 DIAGNOSIS — R7303 Prediabetes: Secondary | ICD-10-CM | POA: Diagnosis not present

## 2022-07-16 MED ORDER — VITAMIN D (ERGOCALCIFEROL) 1.25 MG (50000 UNIT) PO CAPS: 50000.0000 [IU] | ORAL_CAPSULE | ORAL | 0 refills | Status: DC

## 2022-07-16 MED ORDER — METFORMIN HCL 500 MG PO TABS: 500.0000 mg | ORAL_TABLET | Freq: Every day | ORAL | 0 refills | Status: DC

## 2022-07-18 NOTE — Progress Notes (Signed)
Chief Complaint:   OBESITY Tanya [name] is here to discuss her progress with her obesity treatment plan along with follow-up of her obesity related diagnoses. Tanya [name] is on the Category 1 Plan and states she is following her eating plan approximately 80% of the time. Tanya [name] states she is walking 6000 steps and biking 30-60 minutes 4 times per week.  Today's visit was #: 6 Starting weight: 251 lbs Starting date: 03/14/2022 Today's weight: 233 lbs Today's date: 07/17/2022 Total lbs lost to date: 18 lbs Total lbs lost since last in-office visit: 2 lbs  Interim History: Patient staying mindful about portion sizes.  Still having cravings.  Has a good support system.  Patient has been on exercise bike 4-5 times a week and is tracking steps.  Patient has increased her water intake.  Subjective:   1. Vitamin D deficiency Last vitamin D level on 05/22/2022 was 32.7.  Patient is on prescription vitamin D 50,000 IU weekly.  Repeat DEXA scan every 2 years.  2. Pre-diabetes Last A1c was 5.8 up from 5.7.  Patient has been craving sweets.  Patient has never taken metformin.  Assessment/Plan:   1. Vitamin D deficiency Refill- Vitamin D, Ergocalciferol, (DRISDOL) 1.25 MG (50000 UNIT) CAPS capsule; Take 1 capsule (50,000 Units total) by mouth every 7 (seven) days.  Dispense: 5 capsule; Refill: 0  2. Pre-diabetes Check BMP, A1c, B12 in 2 months.  Begin- metFORMIN (GLUCOPHAGE) 500 MG tablet; Take 1 tablet (500 mg total) by mouth daily with breakfast.  Dispense: 30 tablet; Refill: 0  3. Obesity,current BMI 40.1 1.  Increase water intake. 2.  Increase daily step goal to 7000 steps per day.  Tanya [name] is currently in the action stage of change. As such, her goal is to continue with weight loss efforts. She has agreed to the Category 1 Plan.   Exercise goals:  Walking, bike 4-5 times per week  Behavioral modification strategies: increasing lean protein intake, increasing water intake, decreasing eating  out, keeping healthy foods in the home, avoiding temptations, planning for success, and decreasing junk food.  Tanya [name] has agreed to follow-up with our clinic in 4 weeks. She was informed of the importance of frequent follow-up visits to maximize her success with intensive lifestyle modifications for her multiple health conditions.   Objective:   Blood pressure 129/84, pulse 70, temperature 98.4 F (36.9 C), height '5\' 4"'$  (1.626 m), weight 233 lb (105.7 kg), SpO2 99 %. Body mass index is 39.99 kg/m.  General: Cooperative, alert, well developed, in no acute distress. HEENT: Conjunctivae and lids unremarkable. Cardiovascular: Regular rhythm.  Lungs: Normal work of breathing. Neurologic: No focal deficits.   Lab Results  Component Value Date   CREATININE 0.84 05/22/2022   BUN 19 05/22/2022   NA 139 05/22/2022   K 4.2 05/22/2022   CL 103 05/22/2022   CO2 29 05/22/2022   Lab Results  Component Value Date   ALT 17 05/22/2022   AST 18 05/22/2022   ALKPHOS 57 05/22/2022   BILITOT 0.4 05/22/2022   Lab Results  Component Value Date   HGBA1C 5.8 05/22/2022   HGBA1C 5.7 (H) 03/14/2022   HGBA1C 5.6 11/16/2021   HGBA1C 5.4 01/28/2019   HGBA1C 5.5 09/15/2018   Lab Results  Component Value Date   INSULIN 10.1 03/14/2022   Lab Results  Component Value Date   TSH 2.45 05/22/2022   Lab Results  Component Value Date   CHOL 160 05/22/2022   HDL 59.50 05/22/2022  LDLCALC 80 05/22/2022   LDLDIRECT 148.0 07/10/2010   TRIG 107.0 05/22/2022   CHOLHDL 3 05/22/2022   Lab Results  Component Value Date   VD25OH 32.77 05/22/2022   VD25OH 32.5 03/14/2022   VD25OH 46.78 12/17/2019   Lab Results  Component Value Date   WBC 5.8 05/22/2022   HGB 14.3 05/22/2022   HCT 43.1 05/22/2022   MCV 91.2 05/22/2022   PLT 295.0 05/22/2022   No results found for: "IRON", "TIBC", "FERRITIN"  Attestation Statements:   Reviewed by clinician on day of visit: allergies, medications, problem  list, medical history, surgical history, family history, social history, and previous encounter notes.  I, Davy Pique, am acting as Location manager for Loyal Gambler, DO.  I have reviewed the above documentation for accuracy and completeness, and I agree with the above. Dell Ponto, DO

## 2022-07-19 ENCOUNTER — Encounter (INDEPENDENT_AMBULATORY_CARE_PROVIDER_SITE_OTHER): Payer: Self-pay

## 2022-07-23 ENCOUNTER — Other Ambulatory Visit (INDEPENDENT_AMBULATORY_CARE_PROVIDER_SITE_OTHER): Payer: Self-pay | Admitting: Family Medicine

## 2022-07-23 ENCOUNTER — Encounter (INDEPENDENT_AMBULATORY_CARE_PROVIDER_SITE_OTHER): Payer: Self-pay | Admitting: Family Medicine

## 2022-07-23 ENCOUNTER — Encounter: Payer: Self-pay | Admitting: Internal Medicine

## 2022-07-31 ENCOUNTER — Other Ambulatory Visit: Payer: Self-pay | Admitting: Internal Medicine

## 2022-07-31 DIAGNOSIS — E7849 Other hyperlipidemia: Secondary | ICD-10-CM

## 2022-08-08 ENCOUNTER — Encounter: Payer: Self-pay | Admitting: Internal Medicine

## 2022-08-08 NOTE — Progress Notes (Signed)
Outside notes received. Information abstracted. Notes sent to scan. 

## 2022-08-12 ENCOUNTER — Telehealth: Payer: Self-pay | Admitting: Internal Medicine

## 2022-08-12 ENCOUNTER — Ambulatory Visit (INDEPENDENT_AMBULATORY_CARE_PROVIDER_SITE_OTHER): Payer: Medicare HMO | Admitting: Family Medicine

## 2022-08-12 NOTE — Telephone Encounter (Signed)
Left message for patient to call back and schedule Medicare Annual Wellness Visit (AWV) in office.   If not able to come in office, please offer to do virtually or by telephone.   Last AWV:  Please schedule at any time with [facility].  30 minute appointment  Any questions, please contact me at [phone]   Thank you,   [place] for [place] [facility]. [facility] Are[facility] ??[phone] or ??[phone]

## 2022-09-09 ENCOUNTER — Encounter (INDEPENDENT_AMBULATORY_CARE_PROVIDER_SITE_OTHER): Payer: Self-pay | Admitting: Family Medicine

## 2022-09-09 ENCOUNTER — Ambulatory Visit (INDEPENDENT_AMBULATORY_CARE_PROVIDER_SITE_OTHER): Payer: Medicare Other | Admitting: Family Medicine

## 2022-09-09 VITALS — BP 120/81 | HR 69 | Temp 97.7°F | Ht 64.0 in | Wt 232.0 lb

## 2022-09-09 DIAGNOSIS — E559 Vitamin D deficiency, unspecified: Secondary | ICD-10-CM | POA: Diagnosis not present

## 2022-09-09 DIAGNOSIS — Z6839 Body mass index (BMI) 39.0-39.9, adult: Secondary | ICD-10-CM

## 2022-09-09 DIAGNOSIS — R7303 Prediabetes: Secondary | ICD-10-CM

## 2022-09-09 DIAGNOSIS — E65 Localized adiposity: Secondary | ICD-10-CM | POA: Diagnosis not present

## 2022-09-09 MED ORDER — VITAMIN D (ERGOCALCIFEROL) 1.25 MG (50000 UNIT) PO CAPS: 50000.0000 [IU] | ORAL_CAPSULE | ORAL | 0 refills | Status: DC

## 2022-09-09 MED ORDER — VITAMIN D (ERGOCALCIFEROL) 1.25 MG (50000 UNIT) PO CAPS: 50000.0000 [IU] | ORAL_CAPSULE | ORAL | 1 refills | Status: DC

## 2022-09-09 NOTE — Assessment & Plan Note (Signed)
Unchanged Visceral fat rating stable at 18, down from 20 at start of our program Target visceral fat rating 10 or less  Continue working on calorie reduction, regular exercise with a target BMI around 30

## 2022-09-09 NOTE — Assessment & Plan Note (Signed)
Taking vitamin D 50,000 IU once weekly.  Last vitamin D level 32.7 05/22/2022.  Energy level is improving.  Recheck vitamin D level next visit along with a chemistry panel, A1c

## 2022-09-09 NOTE — Progress Notes (Signed)
Office: [phone]  /  Fax: [phone]  WEIGHT SUMMARY AND BIOMETRICS  Vitals Temp: 97.7 F (36.5 C) BP: 120/81 Pulse Rate: 69 SpO2: 98 %   Anthropometric Measurements Height: '5\' 4"'$  (1.626 m) Weight: 232 lb (105.2 kg) BMI (Calculated): 39.8 Weight at Last Visit: 233lb Weight Lost Since Last Visit: 1lb Starting Weight: 251lb Total Weight Loss (lbs): 19 lb (8.618 kg)   Body Composition  Body Fat %: 50.5 % Fat Mass (lbs): 117.2 lbs Muscle Mass (lbs): 109.2 lbs Total Body Water (lbs): 82.6 lbs Visceral Fat Rating : 18   Other Clinical Data Fasting: no Labs: no Today's Visit #: 7 Starting Date: 03/14/22     HPI  Chief Complaint: OBESITY  Tanya [name] is here to discuss her progress with her obesity treatment plan. She is on the the Category 1 Plan and states she is following her eating plan approximately 15 % of the time. She states she is exercising 30 minutes 6 times per week.   Interval History:  Since last office visit she is down 1 lb She was sick x 2 weeks between visits but is back on her meal plan Getting lean protein with all meals Snacks on fresh fruit Started walking outdoors 30 min up to 6 x a week Still has sugar cravings Planning to go to the [facility] for a month   Pharmacotherapy: metformin discontinued due to side effects  PHYSICAL EXAM:  Blood pressure 120/81, pulse 69, temperature 97.7 F (36.5 C), height '5\' 4"'$  (1.626 m), weight 232 lb (105.2 kg), SpO2 98 %. Body mass index is 39.82 kg/m.  General: She is overweight, cooperative, alert, well developed, and in no acute distress. PSYCH: Has normal mood, affect and thought process.   HEENT: EOMI, sclerae are anicteric. Lungs: Normal breathing effort, no conversational dyspnea. Extremities: No edema.  Neurologic: No gross sensory or motor deficits. No tremors or fasciculations noted.    DIAGNOSTIC DATA REVIEWED:  BMET    Component Value Date/Time   NA 139 05/22/2022 0911   K 4.2  05/22/2022 0911   CL 103 05/22/2022 0911   CO2 29 05/22/2022 0911   GLUCOSE 92 05/22/2022 0911   BUN 19 05/22/2022 0911   CREATININE 0.84 05/22/2022 0911   CALCIUM 9.0 05/22/2022 0911   GFRNONAA >60 02/12/2019 2013   GFRAA >60 02/12/2019 2013   Lab Results  Component Value Date   HGBA1C 5.8 05/22/2022   HGBA1C 5.6 11/10/2006   Lab Results  Component Value Date   INSULIN 10.1 03/14/2022   Lab Results  Component Value Date   TSH 2.45 05/22/2022   CBC    Component Value Date/Time   WBC 5.8 05/22/2022 0911   RBC 4.72 05/22/2022 0911   HGB 14.3 05/22/2022 0911   HGB 14.5 03/14/2022 0941   HCT 43.1 05/22/2022 0911   HCT 43.7 03/14/2022 0941   PLT 295.0 05/22/2022 0911   PLT 349 03/14/2022 0941   MCV 91.2 05/22/2022 0911   MCV 93 03/14/2022 0941   MCH 30.8 03/14/2022 0941   MCH 30.5 02/12/2019 2013   MCHC 33.1 05/22/2022 0911   RDW 13.5 05/22/2022 0911   RDW 12.4 03/14/2022 0941   Iron Studies No results found for: "IRON", "TIBC", "FERRITIN", "IRONPCTSAT" Lipid Panel     Component Value Date/Time   CHOL 160 05/22/2022 0911   TRIG 107.0 05/22/2022 0911   HDL 59.50 05/22/2022 0911   CHOLHDL 3 05/22/2022 0911   VLDL 21.4 05/22/2022 0911   LDLCALC 80 05/22/2022  [id number]   LDLDIRECT 148.0 07/10/2010 0840   Hepatic Function Panel     Component Value Date/Time   PROT 6.8 05/22/2022 0911   ALBUMIN 4.2 05/22/2022 0911   AST 18 05/22/2022 0911   ALT 17 05/22/2022 0911   ALKPHOS 57 05/22/2022 0911   BILITOT 0.4 05/22/2022 0911   BILIDIR 0.1 04/05/2015 1033      Component Value Date/Time   TSH 2.45 05/22/2022 0911   Nutritional Lab Results  Component Value Date   VD25OH 32.77 05/22/2022   VD25OH 32.5 03/14/2022   VD25OH 46.78 12/17/2019     ASSESSMENT AND PLAN  TREATMENT PLAN FOR OBESITY:  Recommended Dietary Goals  Tanya [name] is currently in the action stage of change. As such, her goal is to continue weight management plan. She has agreed to the Category 1  Plan.  Behavioral Intervention  We discussed the following Behavioral Modification Strategies today: increasing lean protein intake, increasing vegetables, increasing fiber rich foods, avoiding skipping meals, increasing water intake, work on meal planning and easy cooking plans, and decreasing eating out, consumption of processed foods, reading food labels and making healthy choices when eating convenient foods.  Additional resources provided today: NA  Recommended Physical Activity Goals  Tanya [name] has been advised to work up to 150 minutes of moderate intensity aerobic activity a week and strengthening exercises 2-3 times per week for cardiovascular health, weight loss maintenance and preservation of muscle mass.   She has agreed to continue physical activity as is.    Pharmacotherapy We discussed various medication options to help Tanya [name] with her weight loss efforts and we both agreed to none.  ASSOCIATED CONDITIONS ADDRESSED TODAY  Pre-diabetes Assessment & Plan: Patient was intolerant to metformin and has discontinued.  Her last A1c was 5.8, up from 5.7 previously.  She has actively been working on reducing intake of added sugar and refined carbohydrates.  We discussed the importance of 150 minutes or more of walking each week.  Continue prescribed meal plan.  Plan to recheck A1c in the next 2 to 3 months.  Lab Results  Component Value Date   HGBA1C 5.8 05/22/2022      Obesity,current BMI 39.8  Vitamin D deficiency Assessment & Plan: Taking vitamin D 50,000 IU once weekly.  Last vitamin D level 32.7 05/22/2022.  Energy level is improving.  Recheck vitamin D level next visit along with a chemistry panel, A1c  Orders: -     Vitamin D (Ergocalciferol); Take 1 capsule (50,000 Units total) by mouth every 7 (seven) days.  Dispense: 5 capsule; Refill: 1  Morbid obesity ([place])  Central adiposity Assessment & Plan: Unchanged Visceral fat rating stable at 18, down from 20 at  start of our program Target visceral fat rating 10 or less  Continue working on calorie reduction, regular exercise with a target BMI around 30       No follow-ups on file.Marland Kitchen She was informed of the importance of frequent follow up visits to maximize her success with intensive lifestyle modifications for her multiple health conditions.   ATTESTASTION STATEMENTS:  Reviewed by clinician on day of visit: allergies, medications, problem list, medical history, surgical history, family history, social history, and previous encounter notes.   I have personally spent 30 minutes total time today in preparation, patient care, nutritional counseling and documentation for this visit, including the following: review of clinical lab tests; review of medical tests/procedures/services.      Dell Ponto, DO

## 2022-09-09 NOTE — Assessment & Plan Note (Signed)
Patient was intolerant to metformin and has discontinued.  Her last A1c was 5.8, up from 5.7 previously.  She has actively been working on reducing intake of added sugar and refined carbohydrates.  We discussed the importance of 150 minutes or more of walking each week.  Continue prescribed meal plan.  Plan to recheck A1c in the next 2 to 3 months.  Lab Results  Component Value Date   HGBA1C 5.8 05/22/2022

## 2022-09-20 ENCOUNTER — Encounter: Payer: Self-pay | Admitting: Family

## 2022-09-20 ENCOUNTER — Ambulatory Visit (INDEPENDENT_AMBULATORY_CARE_PROVIDER_SITE_OTHER): Payer: Medicare Other | Admitting: Family

## 2022-09-20 VITALS — BP 123/82 | HR 74 | Temp 97.3°F | Ht 64.0 in | Wt 240.4 lb

## 2022-09-20 DIAGNOSIS — S81811A Laceration without foreign body, right lower leg, initial encounter: Secondary | ICD-10-CM

## 2022-09-20 NOTE — Patient Instructions (Addendum)
It was very nice to see you today!   As discussed, clean your leg wound daily with soap & water, dry the area well, and then apply a thin layer of  bacitracin using a Qtip to the wound and keep covered with bandage with adhesive around edges until a scab starts to form.  If you see any yellow, pussy drainage, have increased pain, or red streaks in the leg, call the office. Keep leg elevated whenever resting.      PLEASE NOTE:  If you had any lab tests please let us know if you have not heard back within a few days. You may see your results on MyChart before we have a chance to review them but we will give you a call once they are reviewed by [place]. If we ordered any referrals today, please let us know if you have not heard from their office within the next week.

## 2022-09-20 NOTE — Progress Notes (Signed)
Patient ID: Tanya [name], female    DOB: 1946-07-02, 77 y.o.   MRN: [id number]  Chief Complaint  Patient presents with   Laceration    Pt c/o a cut on her right leg from a fall yesterday outside. Has tried neosporin.     HPI:      Leg laceration:    pt fell in her flower bed yesterday and scraped her leg against a wooden plantar, reports having bleeding for a long time, but eventually stopped, cleaned wound and elevated her leg as much as she could and then applied neosporin and a bandage. Pt is not on any anticoagulants, no ASA, no fish oil or NSAIDs. Last TdaP in 2021.   Assessment & Plan:  1. Leg laceration, right, initial encounter - wound cleaned with betadine, no foreign debris noted in wound bed, no active bleeding, thin layer of Bacitracin ointment applied, covered with telfa. Advised to clean wound daily with soap & water, dry well, apply the bacitracin as demonstrated and keep covered until scab starts to form. Advised on s/s of infection and when to call the office. Keep leg elevated whenever resting.  Subjective:    Outpatient Medications Prior to Visit  Medication Sig Dispense Refill   buPROPion (WELLBUTRIN XL) 150 MG 24 hr tablet Take 1 tablet (150 mg total) by mouth daily. 90 tablet 1   Calcium Carb-Cholecalciferol (CALCIUM/VITAMIN D) 500-200 MG-UNIT TABS Take 1 tablet by mouth daily.     cetirizine (ZYRTEC) 10 MG tablet TAKE 1 TABLET (10 MG TOTAL) BY MOUTH DAILY. KEEP SCHEDULED APPT FOR FUTURE REFILLS 90 tablet 0   escitalopram (LEXAPRO) 10 MG tablet TAKE 1 TABLET BY MOUTH EVERY DAY 90 tablet 1   famotidine (PEPCID) 40 MG tablet Take 1 tablet (40 mg total) by mouth daily. 90 tablet 3   ipratropium (ATROVENT) 0.06 % nasal spray Place 1 spray into both nostrils 2 (two) times a day.      levocetirizine (XYZAL) 5 MG tablet TAKE 1 TABLET BY MOUTH EVERY DAY IN THE EVENING 90 tablet 2   meclizine (ANTIVERT) 12.5 MG tablet Take 1 tablet (12.5 mg total) by mouth 3 (three) times  daily as needed for dizziness. 15 tablet 0   metroNIDAZOLE (METROCREAM) 0.75 % cream      Multiple Vitamin (MULTIVITAMIN) tablet Take 1 tablet by mouth daily.     simvastatin (ZOCOR) 20 MG tablet TAKE 1 TABLET BY MOUTH EVERY DAY IN THE EVENING 90 tablet 0   Vitamin D, Ergocalciferol, (DRISDOL) 1.25 MG (50000 UNIT) CAPS capsule Take 1 capsule (50,000 Units total) by mouth every 7 (seven) days. 5 capsule 1   No facility-administered medications prior to visit.   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   CATARACT EXTRACTION Bilateral    COLONOSCOPY  04/14/2006   Tics ; Dr Earlean Shawl   KNEE ARTHROSCOPY  2001 &  2011   L & R   THYROID SURGERY     nodule on thyroid   TOTAL KNEE ARTHROPLASTY  02/24/2012   Procedure: TOTAL KNEE ARTHROPLASTY;  Surgeon: Gearlean Alf, MD;  Location: [facility];  Service: Orthopedics;  Laterality: Right;   TOTAL KNEE ARTHROPLASTY Left 02/01/2019   Procedure: TOTAL KNEE ARTHROPLASTY;  Surgeon: Gaynelle Arabian, MD;  Location: [facility];  Service: Orthopedics;  Laterality: Left;  4mn   WISDOM TOOTH EXTRACTION     Allergies  Allergen Reactions   Hydromorphone     whelps on skin    Metformin And Related Other (See Comments)    Lightheaded, neck stiffness   Methocarbamol     Whelps on skin    Oxycodone Hives    Blister on back   Prednisone     Reaction=wired per patient      Objective:    Physical Exam Vitals and nursing note reviewed.  Constitutional:      Appearance: Normal appearance.   Cardiovascular:     Rate and Rhythm: Normal rate and regular rhythm.  Pulmonary:     Effort: Pulmonary effort is normal.     Breath sounds: Normal breath sounds.  Musculoskeletal:        General: Normal range of motion.  Skin:    General: Skin is warm and dry.     Findings: Laceration (right, lower, anterior leg, approx. 2cmX1.5cmX0.3, sanguinous drg) present.  Neurological:     Mental Status: She is alert.  Psychiatric:        Mood and Affect: Mood normal.        Behavior: Behavior normal.    BP 123/82 (BP Location: Left Arm, Patient Position: Sitting, Cuff Size: Large)   Pulse 74   Temp (!) 97.3 F (36.3 C) (Temporal)   Ht '5\' 4"'$  (1.626 m)   Wt 240 lb 6.4 oz (109 kg)   SpO2 99%   BMI 41.26 kg/m  Wt Readings from Last 3 Encounters:  09/20/22 240 lb 6.4 oz (109 kg)  09/09/22 232 lb (105.2 kg)  07/16/22 233 lb (105.7 kg)      HJeanie Sewer NP

## 2022-10-26 ENCOUNTER — Other Ambulatory Visit: Payer: Self-pay | Admitting: Internal Medicine

## 2022-10-26 DIAGNOSIS — E7849 Other hyperlipidemia: Secondary | ICD-10-CM

## 2022-10-28 ENCOUNTER — Other Ambulatory Visit: Payer: Self-pay

## 2022-10-29 ENCOUNTER — Ambulatory Visit (INDEPENDENT_AMBULATORY_CARE_PROVIDER_SITE_OTHER): Payer: Medicare Other | Admitting: Family Medicine

## 2022-11-03 ENCOUNTER — Other Ambulatory Visit: Payer: Self-pay | Admitting: Internal Medicine

## 2022-11-19 ENCOUNTER — Ambulatory Visit (INDEPENDENT_AMBULATORY_CARE_PROVIDER_SITE_OTHER): Payer: Medicare Other | Admitting: Family Medicine

## 2022-12-02 ENCOUNTER — Encounter: Payer: Self-pay | Admitting: Internal Medicine

## 2022-12-02 NOTE — Progress Notes (Unsigned)
Subjective:    Patient ID: Tanya [name], female    DOB: 1945/11/01, 77 y.o.   MRN: [id number]     HPI Tanya [name] is here for follow up of her chronic medical problems.  Sleeping better.  Doing yard work.    Has intermittent vertigo - falls sometimes.  Has not been to  [facility] in a couple of months  Getting more activity in - walking more.    Hard time losing weight - not consistent with eating well.    Medications and allergies reviewed with patient and updated if appropriate.  Current Outpatient Medications on File Prior to Visit  Medication Sig Dispense Refill   buPROPion (WELLBUTRIN XL) 150 MG 24 hr tablet TAKE 1 TABLET BY MOUTH EVERY DAY 90 tablet 0   Calcium Carb-Cholecalciferol (CALCIUM/VITAMIN D) 500-200 MG-UNIT TABS Take 1 tablet by mouth daily.     cetirizine (ZYRTEC) 10 MG tablet TAKE 1 TABLET (10 MG TOTAL) BY MOUTH DAILY. KEEP SCHEDULED APPT FOR FUTURE REFILLS 90 tablet 0   escitalopram (LEXAPRO) 10 MG tablet TAKE 1 TABLET BY MOUTH EVERY DAY 90 tablet 1   famotidine (PEPCID) 40 MG tablet Take 1 tablet (40 mg total) by mouth daily. 90 tablet 3   ipratropium (ATROVENT) 0.06 % nasal spray Place 1 spray into both nostrils 2 (two) times a day.      levocetirizine (XYZAL) 5 MG tablet TAKE 1 TABLET BY MOUTH EVERY DAY IN THE EVENING 90 tablet 2   meclizine (ANTIVERT) 12.5 MG tablet Take 1 tablet (12.5 mg total) by mouth 3 (three) times daily as needed for dizziness. 15 tablet 0   metroNIDAZOLE (METROCREAM) 0.75 % cream      Multiple Vitamin (MULTIVITAMIN) tablet Take 1 tablet by mouth daily.     simvastatin (ZOCOR) 20 MG tablet TAKE 1 TABLET BY MOUTH EVERY DAY IN THE EVENING 90 tablet 0   Vitamin D, Ergocalciferol, (DRISDOL) 1.25 MG (50000 UNIT) CAPS capsule Take 1 capsule (50,000 Units total) by mouth every 7 (seven) days. 5 capsule 1   No current facility-administered medications on file prior to visit.     Review of Systems  Constitutional:   Negative for fever.  Respiratory:  Negative for cough, shortness of breath and wheezing.   Cardiovascular:  Negative for chest pain, palpitations and leg swelling.  Neurological:  Positive for dizziness. Negative for light-headedness and headaches.       Objective:   Vitals:   12/03/22 0924  BP: 122/70  Pulse: 72  Temp: 98.2 F (36.8 C)  SpO2: 97%   BP Readings from Last 3 Encounters:  12/03/22 122/70  09/20/22 123/82  09/09/22 120/81   Wt Readings from Last 3 Encounters:  12/03/22 245 lb (111.1 kg)  09/20/22 240 lb 6.4 oz (109 kg)  09/09/22 232 lb (105.2 kg)   Body mass index is 42.05 kg/m.    Physical Exam Constitutional:      General: She is not in acute distress.    Appearance: Normal appearance.  HENT:     Head: Normocephalic and atraumatic.  Eyes:     Conjunctiva/sclera: Conjunctivae normal.  Cardiovascular:     Rate and Rhythm: Normal rate and regular rhythm.     Heart sounds: Normal heart sounds.  Pulmonary:     Effort: Pulmonary effort is normal. No respiratory distress.     Breath sounds: Normal breath sounds. No wheezing.  Musculoskeletal:     Cervical back: Neck supple.  Right lower leg: No edema.     Left lower leg: No edema.  Lymphadenopathy:     Cervical: No cervical adenopathy.  Skin:    General: Skin is warm and dry.     Findings: No rash.  Neurological:     Mental Status: She is alert. Mental status is at baseline.  Psychiatric:        Mood and Affect: Mood normal.        Behavior: Behavior normal.        Lab Results  Component Value Date   WBC 5.8 05/22/2022   HGB 14.3 05/22/2022   HCT 43.1 05/22/2022   PLT 295.0 05/22/2022   GLUCOSE 92 05/22/2022   CHOL 160 05/22/2022   TRIG 107.0 05/22/2022   HDL 59.50 05/22/2022   LDLDIRECT 148.0 07/10/2010   LDLCALC 80 05/22/2022   ALT 17 05/22/2022   AST 18 05/22/2022   NA 139 05/22/2022   K 4.2 05/22/2022   CL 103 05/22/2022   CREATININE 0.84 05/22/2022   BUN 19 05/22/2022    CO2 29 05/22/2022   TSH 2.45 05/22/2022   INR 1.0 01/28/2019   HGBA1C 5.8 05/22/2022     Assessment & Plan:    See Problem List for Assessment and Plan of chronic medical problems.

## 2022-12-02 NOTE — Patient Instructions (Addendum)
      Blood work was ordered.   The lab is on the first floor.    Medications changes include :   none      Return in about 6 months (around 06/05/2023) for Physical Exam.

## 2022-12-03 ENCOUNTER — Ambulatory Visit (INDEPENDENT_AMBULATORY_CARE_PROVIDER_SITE_OTHER): Payer: Medicare Other | Admitting: Internal Medicine

## 2022-12-03 VITALS — BP 122/70 | HR 72 | Temp 98.2°F | Ht 64.0 in | Wt 245.0 lb

## 2022-12-03 DIAGNOSIS — J309 Allergic rhinitis, unspecified: Secondary | ICD-10-CM | POA: Diagnosis not present

## 2022-12-03 DIAGNOSIS — E7849 Other hyperlipidemia: Secondary | ICD-10-CM

## 2022-12-03 DIAGNOSIS — R7303 Prediabetes: Secondary | ICD-10-CM

## 2022-12-03 DIAGNOSIS — F419 Anxiety disorder, unspecified: Secondary | ICD-10-CM

## 2022-12-03 DIAGNOSIS — K219 Gastro-esophageal reflux disease without esophagitis: Secondary | ICD-10-CM

## 2022-12-03 DIAGNOSIS — F32A Depression, unspecified: Secondary | ICD-10-CM

## 2022-12-03 LAB — COMPREHENSIVE METABOLIC PANEL
ALT: 15 U/L (ref 0–35)
AST: 19 U/L (ref 0–37)
Albumin: 4.2 g/dL (ref 3.5–5.2)
Alkaline Phosphatase: 68 U/L (ref 39–117)
BUN: 14 mg/dL (ref 6–23)
CO2: 30 mEq/L (ref 19–32)
Calcium: 9.3 mg/dL (ref 8.4–10.5)
Chloride: 101 mEq/L (ref 96–112)
Creatinine, Ser: 0.8 mg/dL (ref 0.40–1.20)
GFR: 70.19 mL/min (ref >60.00)
Glucose, Bld: 89 mg/dL (ref 70–99)
Potassium: 5.4 mEq/L — ABNORMAL HIGH (ref 3.5–5.1)
Sodium: 138 mEq/L (ref 135–145)
Total Bilirubin: 0.5 mg/dL (ref 0.2–1.2)
Total Protein: 7.2 g/dL (ref 6.0–8.3)

## 2022-12-03 LAB — LIPID PANEL
Cholesterol: 189 mg/dL (ref 0–200)
HDL: 67 mg/dL (ref >39.00)
LDL Cholesterol: 92 mg/dL (ref 0–99)
NonHDL: 121.98
Total CHOL/HDL Ratio: 3
Triglycerides: 152 mg/dL — ABNORMAL HIGH (ref 0.0–149.0)
VLDL: 30.4 mg/dL (ref 0.0–40.0)

## 2022-12-03 LAB — HEMOGLOBIN A1C: Hgb A1c MFr Bld: 5.7 % (ref 4.6–6.5)

## 2022-12-03 MED ORDER — IPRATROPIUM BROMIDE 0.06 % NA SOLN: 1.0000 | Freq: Three times a day (TID) | NASAL | 3 refills | Status: DC

## 2022-12-03 NOTE — Assessment & Plan Note (Signed)
Chronic Regular exercise and healthy diet encouraged Check lipid panel, CMP Continue simvastatin 20 mg daily 

## 2022-12-03 NOTE — Assessment & Plan Note (Signed)
Chronic Controlled, Stable Continue bupropion Salant 50 mg daily, Lexapro 10 mg daily 

## 2022-12-03 NOTE — Assessment & Plan Note (Signed)
Chronic Controlled Continue Zyrtec daily or Xyzal daily Continue ipratropium nasal spray

## 2022-12-03 NOTE — Assessment & Plan Note (Signed)
Chronic Check a1c Low sugar / carb diet Stressed regular exercise  

## 2022-12-03 NOTE — Assessment & Plan Note (Addendum)
Chronic Stressed the importance of weight loss Stressed regular exercise-goal 5 days a week -she is much more active than she ever has been Discussed healthy diet - has not been consistent with eating well and will get back to it - stressed consistency Was going [facility] - will get back there

## 2022-12-03 NOTE — Assessment & Plan Note (Signed)
Chronic GERD controlled Continue famotidine 40 mg daily 

## 2022-12-23 ENCOUNTER — Other Ambulatory Visit: Payer: Self-pay | Admitting: Internal Medicine

## 2023-01-28 ENCOUNTER — Other Ambulatory Visit: Payer: Self-pay | Admitting: Internal Medicine

## 2023-01-28 DIAGNOSIS — E7849 Other hyperlipidemia: Secondary | ICD-10-CM

## 2023-03-04 ENCOUNTER — Other Ambulatory Visit: Payer: Self-pay | Admitting: Internal Medicine

## 2023-04-01 ENCOUNTER — Other Ambulatory Visit: Payer: Self-pay | Admitting: Internal Medicine

## 2023-04-01 DIAGNOSIS — E7849 Other hyperlipidemia: Secondary | ICD-10-CM

## 2023-05-18 ENCOUNTER — Encounter: Payer: Self-pay | Admitting: Internal Medicine

## 2023-05-18 NOTE — Patient Instructions (Addendum)

## 2023-05-18 NOTE — Progress Notes (Signed)
Subjective:    Patient ID: Tanya [name], female    DOB: 1945/09/16, 77 y.o.   MRN: [id number]      HPI [name] is here for a Physical exam and her chronic medical problems.      Medications and allergies reviewed with patient and updated if appropriate.  Current Outpatient Medications on File Prior to Visit  Medication Sig Dispense Refill   buPROPion (WELLBUTRIN XL) 150 MG 24 hr tablet TAKE 1 TABLET BY MOUTH EVERY DAY 90 tablet 0   Calcium Carb-Cholecalciferol (CALCIUM/VITAMIN D) 500-200 MG-UNIT TABS Take 1 tablet by mouth daily.     escitalopram (LEXAPRO) 10 MG tablet TAKE 1 TABLET BY MOUTH EVERY DAY 90 tablet 1   meclizine (ANTIVERT) 12.5 MG tablet Take 1 tablet (12.5 mg total) by mouth 3 (three) times daily as needed for dizziness. 15 tablet 0   metroNIDAZOLE (METROCREAM) 0.75 % cream      Multiple Vitamin (MULTIVITAMIN) tablet Take 1 tablet by mouth daily.     pantoprazole (PROTONIX) 40 MG tablet Take 40 mg by mouth as needed.     simvastatin (ZOCOR) 20 MG tablet TAKE 1 TABLET BY MOUTH EVERY DAY IN THE EVENING 90 tablet 0   Vitamin D, Ergocalciferol, (DRISDOL) 1.25 MG (50000 UNIT) CAPS capsule Take 1 capsule (50,000 Units total) by mouth every 7 (seven) days. 5 capsule 1   famotidine (PEPCID) 40 MG tablet TAKE 1 TABLET BY MOUTH EVERY DAY (Patient not taking: Reported on 05/23/2023) 90 tablet 3   No current facility-administered medications on file prior to visit.    Review of Systems  Constitutional:  Negative for fever.  Eyes:  Negative for visual disturbance.  Respiratory:  Negative for cough, shortness of breath and wheezing.   Cardiovascular:  Negative for chest pain, palpitations and leg swelling.  Gastrointestinal:  Negative for abdominal pain, blood in stool, constipation and diarrhea.       No gerd  Genitourinary:  Negative for dysuria.  Musculoskeletal:  Negative for arthralgias and back pain.  Skin:  Negative for rash.  Neurological:  Positive for dizziness  (occ). Negative for light-headedness and headaches.  Psychiatric/Behavioral:  Positive for sleep disturbance. Negative for dysphoric mood. The patient is not nervous/anxious.        Objective:   Vitals:   05/23/23 0805  BP: 124/74  Pulse: 62  Temp: 98 F (36.7 C)  SpO2: 95%   Filed Weights   05/23/23 0805  Weight: 229 lb (103.9 kg)   Body mass index is 39.31 kg/m.  BP Readings from Last 3 Encounters:  05/23/23 124/74  12/03/22 122/70  09/20/22 123/82    Wt Readings from Last 3 Encounters:  05/23/23 229 lb (103.9 kg)  05/22/23 235 lb (106.6 kg)  12/03/22 245 lb (111.1 kg)       Physical Exam Constitutional: She appears well-developed and well-nourished. No distress.  HENT:  Head: Normocephalic and atraumatic.  Right Ear: External ear normal. Normal ear canal and TM Left Ear: External ear normal.  Normal ear canal and TM Mouth/Throat: Oropharynx is clear and moist.  Eyes: Conjunctivae normal.  Neck: Neck supple. No tracheal deviation present. No thyromegaly present.  No carotid bruit  Cardiovascular: Normal rate, regular rhythm and normal heart sounds.   No murmur heard.  No edema. Pulmonary/Chest: Effort normal and breath sounds normal. No respiratory distress. She has no wheezes. She has no rales.  Breast: deferred   Abdominal: Soft. She exhibits no distension. There is no tenderness.  Lymphadenopathy: She has no cervical adenopathy.  Skin: Skin is warm and dry. She is not diaphoretic.  Psychiatric: She has a normal mood and affect. Her behavior is normal.     Lab Results  Component Value Date   WBC 5.8 05/22/2022   HGB 14.3 05/22/2022   HCT 43.1 05/22/2022   PLT 295.0 05/22/2022   GLUCOSE 89 12/03/2022   CHOL 189 12/03/2022   TRIG 152.0 (H) 12/03/2022   HDL 67.00 12/03/2022   LDLDIRECT 148.0 07/10/2010   LDLCALC 92 12/03/2022   ALT 15 12/03/2022   AST 19 12/03/2022   NA 138 12/03/2022   K 5.4 (H) 12/03/2022   CL 101 12/03/2022   CREATININE  0.80 12/03/2022   BUN 14 12/03/2022   CO2 30 12/03/2022   TSH 2.45 05/22/2022   INR 1.0 01/28/2019   HGBA1C 5.7 12/03/2022         Assessment & Plan:   Physical exam: Screening blood work  ordered Exercise  active Weight  working on weight loss Substance abuse  none   Reviewed recommended immunizations.   Health Maintenance  Topic Date Due   COVID-19 Vaccine (4 - 2023-24 season) 06/07/2023 (Originally 03/16/2023)   Medicare Annual Wellness (AWV)  05/21/2024   DEXA SCAN  06/18/2024   DTaP/Tdap/Td (3 - Td or Tdap) 01/04/2030   Pneumonia Vaccine 27+ Years old  Completed   INFLUENZA VACCINE  Completed   Hepatitis C Screening  Completed   Zoster Vaccines- Shingrix  Completed   HPV VACCINES  Aged Out   Colonoscopy  Discontinued          See Problem List for Assessment and Plan of chronic medical problems.

## 2023-05-22 ENCOUNTER — Ambulatory Visit: Payer: Medicare Other

## 2023-05-22 VITALS — Ht 64.0 in | Wt 235.0 lb

## 2023-05-22 DIAGNOSIS — Z0000 Encounter for general adult medical examination without abnormal findings: Secondary | ICD-10-CM

## 2023-05-22 NOTE — Patient Instructions (Signed)
Tanya [name] , Thank you for taking time to come for your Medicare Wellness Visit. I appreciate your ongoing commitment to your health goals. Please review the following plan we discussed and let me know if I can assist you in the future.   Referrals/Orders/Follow-Ups/Clinician Recommendations: It was very nice talking with you today.  Keep up the good work.  This is a list of the screening recommended for you and due dates:  Health Maintenance  Topic Date Due   COVID-19 Vaccine (4 - 2023-24 season) 06/07/2023*   Medicare Annual Wellness Visit  05/21/2024   DEXA scan (bone density measurement)  06/18/2024   DTaP/Tdap/Td vaccine (3 - Td or Tdap) 01/04/2030   Pneumonia Vaccine  Completed   Flu Shot  Completed   Hepatitis C Screening  Completed   Zoster (Shingles) Vaccine  Completed   HPV Vaccine  Aged Out   Colon Cancer Screening  Discontinued  *Topic was postponed. The date shown is not the original due date.    Advanced directives: (Copy Requested) Please bring a copy of your health care power of attorney and living will to the office to be added to your chart at your convenience.  Next Medicare Annual Wellness Visit scheduled for next year: No

## 2023-05-22 NOTE — Progress Notes (Addendum)
Subjective:   Tanya [name] is a 77 y.o. female who presents for Medicare Annual (Subsequent) preventive examination.  Visit Complete: Virtual I connected with  Frutoso Schatz on 05/22/23 by a audio enabled telemedicine application and verified that I am speaking with the correct person using two identifiers.  Patient Location: Home  Provider Location: Office/Clinic  I discussed the limitations of evaluation and management by telemedicine. The patient expressed understanding and agreed to proceed.  Vital Signs: Because this visit was a virtual/telehealth visit, some criteria may be missing or patient reported. Any vitals not documented were not able to be obtained and vitals that have been documented are patient reported.    Cardiac Risk Factors include: advanced age (>51men, >52 women);dyslipidemia;obesity (BMI >30kg/m2);Other (see comment), Risk factor comments: Osteopenia.     Objective:    Today's Vitals   05/22/23 1036  Weight: 235 lb (106.6 kg)  Height: 5\' 4"  (1.626 m)   Body mass index is 40.34 kg/m.     05/22/2023   10:43 AM 08/24/2021    1:46 PM 08/23/2020    9:55 AM 01/28/2020    2:11 PM 05/31/2019    3:39 PM 02/13/2019    2:57 AM 02/01/2019    9:44 AM  Advanced Directives  Does Patient Have a Medical Advance Directive? Yes Yes Yes Yes Yes Yes Yes  Type of Estate agent of [place];Living will Living will;Healthcare Power of [employer] Power of [place];Living will Healthcare Power of [place];Living will Healthcare Power of [place];Living will Healthcare Power of [place];Living will Healthcare Power of [place];Living will  Does patient want to make changes to medical advance directive?  No - Patient declined No - Patient declined    No - Patient declined  Copy of Healthcare Power of Attorney in Chart? No - copy requested No - copy requested No - copy requested   No - copy requested Yes - validated most recent copy scanned in chart (See  row information)    Current Medications (verified) Outpatient Encounter Medications as of 05/22/2023  Medication Sig   buPROPion (WELLBUTRIN XL) 150 MG 24 hr tablet TAKE 1 TABLET BY MOUTH EVERY DAY   Calcium Carb-Cholecalciferol (CALCIUM/VITAMIN D) 500-200 MG-UNIT TABS Take 1 tablet by mouth daily.   cetirizine (ZYRTEC) 10 MG tablet TAKE 1 TABLET (10 MG TOTAL) BY MOUTH DAILY. KEEP SCHEDULED APPT FOR FUTURE REFILLS   escitalopram (LEXAPRO) 10 MG tablet TAKE 1 TABLET BY MOUTH EVERY DAY   famotidine (PEPCID) 40 MG tablet TAKE 1 TABLET BY MOUTH EVERY DAY   ipratropium (ATROVENT) 0.06 % nasal spray Place 1 spray into both nostrils 3 (three) times daily.   levocetirizine (XYZAL) 5 MG tablet TAKE 1 TABLET BY MOUTH EVERY DAY IN THE EVENING   meclizine (ANTIVERT) 12.5 MG tablet Take 1 tablet (12.5 mg total) by mouth 3 (three) times daily as needed for dizziness.   metroNIDAZOLE (METROCREAM) 0.75 % cream    Multiple Vitamin (MULTIVITAMIN) tablet Take 1 tablet by mouth daily.   simvastatin (ZOCOR) 20 MG tablet TAKE 1 TABLET BY MOUTH EVERY DAY IN THE EVENING   Vitamin D, Ergocalciferol, (DRISDOL) 1.25 MG (50000 UNIT) CAPS capsule Take 1 capsule (50,000 Units total) by mouth every 7 (seven) days.   No facility-administered encounter medications on file as of 05/22/2023.    Allergies (verified) Hydromorphone, Metformin and related, Methocarbamol, Oxycodone, and Prednisone   History: Past Medical History:  Diagnosis Date   Arthritis    Bursitis 2014   Left;  Dr Despina Hick   Cancer [facility]) 1986   breast cancer L   Hyperlipidemia    Lymphedema 2000   LUE   perennial allergies    PONV (postoperative nausea and vomiting)    Skin cancer    basil cell   Vertigo    Past Surgical History:  Procedure Laterality Date   ABDOMINAL HYSTERECTOMY  1990   & BSO for fibroids   ANKLE FRACTURE SURGERY  2006   BREAST RECONSTRUCTION  1986   BREAST SURGERY  1986   double mastectonmy fror cancer on L   CATARACT  EXTRACTION Bilateral    COLONOSCOPY  04/14/2006   Tics ; Dr Kinnie Scales   KNEE ARTHROSCOPY  2001 & 2011   L & R   THYROID SURGERY     nodule on thyroid   TOTAL KNEE ARTHROPLASTY  02/24/2012   Procedure: TOTAL KNEE ARTHROPLASTY;  Surgeon: Loanne Drilling, MD;  Location: [facility];  Service: Orthopedics;  Laterality: Right;   TOTAL KNEE ARTHROPLASTY Left 02/01/2019   Procedure: TOTAL KNEE ARTHROPLASTY;  Surgeon: Ollen Gross, MD;  Location: [facility];  Service: Orthopedics;  Laterality: Left;    WISDOM TOOTH EXTRACTION     Family History  Problem Relation Age of Onset   Hyperlipidemia Mother    Hypertension Mother    Heart disease Mother        pacer   Colon polyps Mother        pre cancerous   Glaucoma Mother    Dementia Mother    Anxiety disorder Mother    Heart attack Maternal Grandmother 20   Hypertension Maternal Grandmother    Stroke Maternal Grandfather 46   Hypertension Maternal Grandfather    Heart attack Maternal Uncle 65   Cancer Neg Hx    Colon cancer Neg Hx    Esophageal cancer Neg Hx    Rectal cancer Neg Hx    Stomach cancer Neg Hx    Pancreatic cancer Neg Hx    Social History   Socioeconomic History   Marital status: Married    Spouse name: Renae Fickle   Number of children: 1   Years of education: Not on file   Highest education level: Not on file  Occupational History   Occupation: Retired  Tobacco Use   Smoking status: Never   Smokeless tobacco: Never  Vaping Use   Vaping status: Never Used  Substance and Sexual Activity   Alcohol use: Yes    Comment:  very rarely, 1-2 per month   Drug use: No   Sexual activity: Not Currently  Other Topics Concern   Not on file  Social History Narrative   Lives with husband.    Social Determinants of Health   Financial Resource Strain: Low Risk  (05/22/2023)   Overall Financial Resource Strain (CARDIA)    Difficulty of Paying Living Expenses: Not hard at all  Food Insecurity: No Food Insecurity (05/22/2023)    Hunger Vital Sign    Worried About Running Out of Food in the Last Year: Never true    Ran Out of Food in the Last Year: Never true  Transportation Needs: No Transportation Needs (05/22/2023)   PRAPARE - Administrator, Civil Service (Medical): No    Lack of Transportation (Non-Medical): No  Physical Activity: Sufficiently Active (05/22/2023)   Exercise Vital Sign    Days of Exercise per Week: 5 days    Minutes of Exercise per Session: 40 min  Stress: No Stress Concern  Present (05/22/2023)   [employer] of Occupational Health - Occupational Stress Questionnaire    Feeling of Stress : Not at all  Social Connections: Moderately Integrated (05/22/2023)   Social Connection and Isolation Panel [NHANES]    Frequency of Communication with Friends and Family: Twice a week    Frequency of Social Gatherings with Friends and Family: Once a week    Attends Religious Services: More than 4 times per year    Active Member of [employer] or Organizations: No    Attends Engineer, structural: Never    Marital Status: Married    Tobacco Counseling Counseling given: Not Answered   Clinical Intake:  Pre-visit preparation completed: Yes  Pain : No/denies pain     BMI - recorded: 40.34 Nutritional Status: BMI > 30  Obese Nutritional Risks: None Diabetes: No  How often do you need to have someone help you when you read instructions, pamphlets, or other written materials from your doctor or pharmacy?: 1 - Never  Interpreter Needed?: No  Information entered by :: Shayon Trompeter, RMA   Activities of Daily Living    05/22/2023   10:38 AM  In your present state of health, do you have any difficulty performing the following activities:  Hearing? 0  Vision? 0  Difficulty concentrating or making decisions? 0  Walking or climbing stairs? 0  Dressing or bathing? 0  Doing errands, shopping? 0  Preparing Food and eating ? N  Using the Toilet? N  In the past six months, have you  accidently leaked urine? N  Do you have problems with loss of bowel control? N  Managing your Medications? N  Managing your Finances? N  Housekeeping or managing your Housekeeping? N    Patient Care Team: Pincus Sanes, MD as PCP - General (Internal Medicine) Mateo Flow, MD as Consulting Physician (Ophthalmology) Szabat, Vinnie Level, [facility] (Inactive) as Pharmacist (Pharmacist)  Indicate any recent Medical Services you may have received from other than [facility] providers in the past year (date may be approximate).     Assessment:   This is a routine wellness examination for Shauna.  Hearing/Vision screen Hearing Screening - Comments:: Denies hearing difficulties   Vision Screening - Comments:: Had cataract surgery   Goals Addressed               This Visit's Progress     DIET - INCREASE WATER INTAKE (pt-stated)   On track     My goal is to lose some weight, increase my water intake, stay away from sweets/breads and increase my walking.      Depression Screen    05/22/2023   10:49 AM 12/03/2022    9:37 AM 09/20/2022   11:14 AM 05/21/2022    8:17 AM 03/14/2022    8:45 AM 11/16/2021    7:55 AM 08/24/2021    1:51 PM  PHQ 2/9 Scores  PHQ - 2 Score 0 0 0 0 6 0 0  PHQ- 9 Score 1 0 0  24      Fall Risk    05/22/2023   10:44 AM 12/03/2022    9:37 AM 09/20/2022   11:17 AM 05/21/2022    8:16 AM 11/16/2021    7:55 AM  Fall Risk   Falls in the past year? 0 0 1 0 0  Number falls in past yr: 0 0 0 0 0  Injury with Fall? 0 0 1 0 0  Risk for fall due to : No Fall Risks  No Fall Risks No Fall Risks No Fall Risks No Fall Risks  Follow up Falls prevention discussed;Falls evaluation completed Falls evaluation completed Falls evaluation completed;Education provided Falls evaluation completed Falls evaluation completed    MEDICARE RISK AT HOME: Medicare Risk at Home Any stairs in or around the home?: Yes If so, are there any without handrails?: Yes Home free of loose throw rugs in walkways,  pet beds, electrical cords, etc?: Yes Adequate lighting in your home to reduce risk of falls?: Yes Life alert?: No Use of a cane, walker or w/c?: No Grab bars in the bathroom?: No Shower chair or bench in shower?: No Elevated toilet seat or a handicapped toilet?: No  TIMED UP AND GO:  Was the test performed?  No    Cognitive Function:        05/22/2023   10:45 AM  6CIT Screen  What Year? 0 points  What month? 0 points  What time? 0 points  Count back from 20 0 points  Months in reverse 0 points  Repeat phrase 0 points  Total Score 0 points    Immunizations Immunization History  Administered Date(s) Administered   Fluad Quad(high Dose 65+) 06/19/2020, 05/21/2022   Fluad Trivalent(High Dose 65+) 05/13/2023   Influenza Whole 04/06/2009, 06/15/2011   Influenza, High Dose Seasonal PF 04/29/2014, 05/03/2018, 09/23/2018, 05/17/2019, 04/23/2021   Influenza,inj,quad, With Preservative 05/15/2017, 05/03/2018   Influenza-Unspecified 04/19/2017   PFIZER(Purple Top)SARS-COV-2 Vaccination 08/03/2019, 08/24/2019, 03/22/2020   Pneumococcal Conjugate-13 04/05/2015   Pneumococcal Polysaccharide-23 07/10/2009, 09/23/2018, 03/22/2020, 03/28/2021   Pneumococcal-Unspecified 07/15/2016   Td 04/06/2009   Tdap 01/05/2020   Zoster Recombinant(Shingrix) 08/07/2017, 05/14/2018   Zoster, Live 07/03/2009    TDAP status: Up to date  Flu Vaccine status: Up to date  Pneumococcal vaccine status: Up to date  Covid-19 vaccine status: Declined, Education has been provided regarding the importance of this vaccine but patient still declined. Advised may receive this vaccine at local pharmacy or Health Dept.or vaccine clinic. Aware to provide a copy of the vaccination record if obtained from local pharmacy or Health Dept. Verbalized acceptance and understanding.  Qualifies for Shingles Vaccine? Yes   Zostavax completed No   Shingrix Completed?: No.    Education has been provided regarding the  importance of this vaccine. Patient has been advised to call insurance company to determine out of pocket expense if they have not yet received this vaccine. Advised may also receive vaccine at local pharmacy or Health Dept. Verbalized acceptance and understanding.  Screening Tests Health Maintenance  Topic Date Due   COVID-19 Vaccine (4 - 2023-24 season) 06/07/2023 (Originally 03/16/2023)   Medicare Annual Wellness (AWV)  05/21/2024   DEXA SCAN  06/18/2024   DTaP/Tdap/Td (3 - Td or Tdap) 01/04/2030   Pneumonia Vaccine 45+ Years old  Completed   INFLUENZA VACCINE  Completed   Hepatitis C Screening  Completed   Zoster Vaccines- Shingrix  Completed   HPV VACCINES  Aged Out   Colonoscopy  Discontinued    Health Maintenance  There are no preventive care reminders to display for this patient.   Colorectal cancer screening: No longer required.   Mammogram status: No longer required due to age.  Bone Density status: Completed 07/03/2022. Results reflect: Bone density results: OSTEOPENIA. Repeat every 2 years.  Lung Cancer Screening: (Low Dose CT Chest recommended if Age 41-80 years, 20 pack-year currently smoking OR have quit w/in 15years.) does not qualify.   Lung Cancer Screening Referral: N/A  Additional Screening:  Hepatitis  C Screening: does qualify; Completed 04/18/2016  Vision Screening: Recommended annual ophthalmology exams for early detection of glaucoma and other disorders of the eye. Is the patient up to date with their annual eye exam?  Yes  Who is the provider or what is the name of the office in which the patient attends annual eye exams? Dr. Elmer Picker If pt is not established with a provider, would they like to be referred to a provider to establish care? No .   Dental Screening: Recommended annual dental exams for proper oral hygiene  Community Resource Referral / Chronic Care Management: CRR required this visit?  No   CCM required this visit?  No     Plan:      I have personally reviewed and noted the following in the patient's chart:   Medical and social history Use of alcohol, tobacco or illicit drugs  Current medications and supplements including opioid prescriptions. Patient is not currently taking opioid prescriptions. Functional ability and status Nutritional status Physical activity Advanced directives List of other physicians Hospitalizations, surgeries, and ER visits in previous 12 months Vitals Screenings to include cognitive, depression, and falls Referrals and appointments  In addition, I have reviewed and discussed with patient certain preventive protocols, quality metrics, and best practice recommendations. A written personalized care plan for preventive services as well as general preventive health recommendations were provided to patient.     Carolee Channell L Cortlin Marano, CMA   05/22/2023   After Visit Summary: (MyChart) Due to this being a telephonic visit, the after visit summary with patients personalized plan was offered to patient via MyChart   Nurse Notes: Patient is up to date on all her health maintenance.  She would like to discuss her DEXA with Dr. Lawerance Bach during her visit tomorrow.  Patient would like to wait to schedule a visit for next year's AWV.  Patient had no other concerns to address today.

## 2023-05-23 ENCOUNTER — Ambulatory Visit (INDEPENDENT_AMBULATORY_CARE_PROVIDER_SITE_OTHER): Payer: Medicare Other | Admitting: Internal Medicine

## 2023-05-23 VITALS — BP 124/74 | HR 62 | Temp 98.0°F | Ht 64.0 in | Wt 229.0 lb

## 2023-05-23 DIAGNOSIS — E7849 Other hyperlipidemia: Secondary | ICD-10-CM | POA: Diagnosis not present

## 2023-05-23 DIAGNOSIS — Z0000 Encounter for general adult medical examination without abnormal findings: Secondary | ICD-10-CM | POA: Diagnosis not present

## 2023-05-23 DIAGNOSIS — F32A Depression, unspecified: Secondary | ICD-10-CM

## 2023-05-23 DIAGNOSIS — R7303 Prediabetes: Secondary | ICD-10-CM | POA: Diagnosis not present

## 2023-05-23 DIAGNOSIS — F419 Anxiety disorder, unspecified: Secondary | ICD-10-CM

## 2023-05-23 DIAGNOSIS — G4709 Other insomnia: Secondary | ICD-10-CM

## 2023-05-23 DIAGNOSIS — K219 Gastro-esophageal reflux disease without esophagitis: Secondary | ICD-10-CM | POA: Diagnosis not present

## 2023-05-23 LAB — CBC WITH DIFFERENTIAL/PLATELET
Basophils Absolute: 0.1 10*3/uL (ref 0.0–0.1)
Basophils Relative: 0.9 % (ref 0.0–3.0)
Eosinophils Absolute: 0.2 10*3/uL (ref 0.0–0.7)
Eosinophils Relative: 3.4 % (ref 0.0–5.0)
HCT: 44.1 % (ref 36.0–46.0)
Hemoglobin: 14.8 g/dL (ref 12.0–15.0)
Lymphocytes Relative: 25.4 % (ref 12.0–46.0)
Lymphs Abs: 1.7 10*3/uL (ref 0.7–4.0)
MCHC: 33.5 g/dL (ref 30.0–36.0)
MCV: 92.1 fL (ref 78.0–100.0)
Monocytes Absolute: 0.6 10*3/uL (ref 0.1–1.0)
Monocytes Relative: 8.8 % (ref 3.0–12.0)
Neutro Abs: 4 10*3/uL (ref 1.4–7.7)
Neutrophils Relative %: 61.5 % (ref 43.0–77.0)
Platelets: 332 10*3/uL (ref 150.0–400.0)
RBC: 4.79 Mil/uL (ref 3.87–5.11)
RDW: 13.3 % (ref 11.5–15.5)
WBC: 6.5 10*3/uL (ref 4.0–10.5)

## 2023-05-23 LAB — COMPREHENSIVE METABOLIC PANEL
ALT: 14 U/L (ref 0–35)
AST: 20 U/L (ref 0–37)
Albumin: 4.5 g/dL (ref 3.5–5.2)
Alkaline Phosphatase: 67 U/L (ref 39–117)
BUN: 16 mg/dL (ref 6–23)
CO2: 28 meq/L (ref 19–32)
Calcium: 9.4 mg/dL (ref 8.4–10.5)
Chloride: 103 meq/L (ref 96–112)
Creatinine, Ser: 1.01 mg/dL (ref 0.40–1.20)
GFR: 52.89 mL/min — ABNORMAL LOW (ref >60.00)
Glucose, Bld: 93 mg/dL (ref 70–99)
Potassium: 4.9 meq/L (ref 3.5–5.1)
Sodium: 139 meq/L (ref 135–145)
Total Bilirubin: 0.6 mg/dL (ref 0.2–1.2)
Total Protein: 7.4 g/dL (ref 6.0–8.3)

## 2023-05-23 LAB — LIPID PANEL
Cholesterol: 181 mg/dL (ref 0–200)
HDL: 64.4 mg/dL (ref >39.00)
LDL Cholesterol: 98 mg/dL (ref 0–99)
NonHDL: 116.26
Total CHOL/HDL Ratio: 3
Triglycerides: 93 mg/dL (ref 0.0–149.0)
VLDL: 18.6 mg/dL (ref 0.0–40.0)

## 2023-05-23 LAB — HEMOGLOBIN A1C: Hgb A1c MFr Bld: 5.8 % (ref 4.6–6.5)

## 2023-05-23 MED ORDER — BUPROPION HCL ER (XL) 150 MG PO TB24: 150.0000 mg | ORAL_TABLET | Freq: Every day | ORAL | 3 refills | Status: DC

## 2023-05-23 MED ORDER — SIMVASTATIN 20 MG PO TABS: ORAL_TABLET | ORAL | 3 refills | Status: DC

## 2023-05-23 MED ORDER — PANTOPRAZOLE SODIUM 40 MG PO TBEC: 40.0000 mg | DELAYED_RELEASE_TABLET | Freq: Every day | ORAL | 2 refills | Status: DC | PRN

## 2023-05-23 MED ORDER — ESCITALOPRAM OXALATE 10 MG PO TABS: 10.0000 mg | ORAL_TABLET | Freq: Every day | ORAL | 3 refills | Status: DC

## 2023-05-23 NOTE — Assessment & Plan Note (Addendum)
Chronic GERD controlled famotidine 40 mg daily did not work - stopped it Taking protonix 40 mg daily as needed - ok to continue - keep as needed

## 2023-05-23 NOTE — Assessment & Plan Note (Signed)
Chronic Lab Results  Component Value Date   HGBA1C 5.7 12/03/2022   Check a1c Low sugar / carb diet Stressed regular exercise

## 2023-05-23 NOTE — Assessment & Plan Note (Signed)
Chronic Regular exercise and healthy diet encouraged Check lipid panel, CMP Continue simvastatin 20 mg daily 

## 2023-05-23 NOTE — Assessment & Plan Note (Signed)
Chronic Controlled, Stable Continue bupropion xl 150 mg daily, Lexapro 10 mg daily

## 2023-05-23 NOTE — Assessment & Plan Note (Signed)
Falls asleep ok but once she gets up to go to the bathroom can not get back to sleep Not fatigued during the day

## 2023-06-10 ENCOUNTER — Encounter: Payer: Medicare Other | Admitting: Internal Medicine

## 2023-11-17 ENCOUNTER — Encounter: Payer: Self-pay | Admitting: Internal Medicine

## 2023-11-17 ENCOUNTER — Ambulatory Visit: Admitting: Internal Medicine

## 2023-11-17 VITALS — BP 116/72 | HR 78 | Temp 98.2°F | Ht 64.0 in | Wt 248.0 lb

## 2023-11-17 DIAGNOSIS — F32A Depression, unspecified: Secondary | ICD-10-CM

## 2023-11-17 DIAGNOSIS — R413 Other amnesia: Secondary | ICD-10-CM | POA: Diagnosis not present

## 2023-11-17 DIAGNOSIS — J22 Unspecified acute lower respiratory infection: Secondary | ICD-10-CM | POA: Diagnosis not present

## 2023-11-17 DIAGNOSIS — F419 Anxiety disorder, unspecified: Secondary | ICD-10-CM | POA: Diagnosis not present

## 2023-11-17 MED ORDER — AMOXICILLIN-POT CLAVULANATE 875-125 MG PO TABS: 1.0000 | ORAL_TABLET | Freq: Two times a day (BID) | ORAL | 0 refills | Status: AC

## 2023-11-17 MED ORDER — PROMETHAZINE-DM 6.25-15 MG/5ML PO SYRP: 5.0000 mL | ORAL_SOLUTION | Freq: Four times a day (QID) | ORAL | 0 refills | Status: DC | PRN

## 2023-11-17 NOTE — Progress Notes (Signed)
 Subjective:    Patient ID: Tanya [name], female    DOB: 1945/12/15, 78 y.o.   MRN: [id number]      HPI Tanya [name] is here for  Chief Complaint  Patient presents with   Cough    Cough over a few weeks; Some discomfort in her chest when she coughs    She is here for an acute visit for cold symptoms.   Her symptoms started 10-14 days  She is experiencing persistent productive cough and chest pain from coughing so much.  She does have some mild headaches and fatigue.  Initially she had sore throat, more nasal congestion and bodyaches, but those have improved.  She is most concerned about the cough that does not seem to be improving.  She has tried taking otc cough medication  Her daughter is here with her and she is concerned About possible memory issues.  She seems not to want to do certain things.  Is also not eating as much.  She is no longer cooking.    The patient memory issues.  She denies any depression.     Medications and allergies reviewed with patient and updated if appropriate.  Current Outpatient Medications on File Prior to Visit  Medication Sig Dispense Refill   buPROPion  (WELLBUTRIN  XL) 150 MG 24 hr tablet Take 1 tablet (150 mg total) by mouth daily. 90 tablet 3   Calcium Carb-Cholecalciferol (CALCIUM/VITAMIN D ) 500-200 MG-UNIT TABS Take 1 tablet by mouth daily.     escitalopram  (LEXAPRO ) 10 MG tablet Take 1 tablet (10 mg total) by mouth daily. 90 tablet 3   meclizine  (ANTIVERT ) 12.5 MG tablet Take 1 tablet (12.5 mg total) by mouth 3 (three) times daily as needed for dizziness. 15 tablet 0   metroNIDAZOLE  (METROCREAM ) 0.75 % cream      Multiple Vitamin (MULTIVITAMIN) tablet Take 1 tablet by mouth daily.     pantoprazole  (PROTONIX ) 40 MG tablet Take 1 tablet (40 mg total) by mouth daily as needed. 90 tablet 2   simvastatin  (ZOCOR ) 20 MG tablet TAKE 1 TABLET BY MOUTH EVERY DAY IN THE EVENING 90 tablet 3   Vitamin D , Ergocalciferol , (DRISDOL ) 1.25 MG (50000  UNIT) CAPS capsule Take 1 capsule (50,000 Units total) by mouth every 7 (seven) days. 5 capsule 1   No current facility-administered medications on file prior to visit.    Review of Systems  Constitutional:  Positive for fatigue. Negative for fever.  HENT:  Positive for sore throat (initially - resovled). Negative for ear pain, hearing loss, postnasal drip, sinus pressure and sinus pain. Congestion: some clear.  Respiratory:  Positive for cough (deep - productive). Negative for chest tightness, shortness of breath and wheezing.   Cardiovascular:  Positive for chest pain (with cough).  Gastrointestinal:  Negative for diarrhea and nausea.  Musculoskeletal:  Negative for myalgias.  Neurological:  Positive for dizziness (chronic) and headaches. Negative for light-headedness.  Psychiatric/Behavioral:  Negative for dysphoric mood.        Objective:   Vitals:   11/17/23 1534  BP: 116/72  Pulse: 78  Temp: 98.2 F (36.8 C)  SpO2: 96%   BP Readings from Last 3 Encounters:  11/17/23 116/72  05/23/23 124/74  12/03/22 122/70   Wt Readings from Last 3 Encounters:  11/17/23 248 lb (112.5 kg)  05/23/23 229 lb (103.9 kg)  05/22/23 235 lb (106.6 kg)   Body mass index is 42.57 kg/m.    Physical Exam Constitutional:  General: She is not in acute distress.    Appearance: Normal appearance. She is not ill-appearing.  HENT:     Head: Normocephalic and atraumatic.     Right Ear: Tympanic membrane, ear canal and external ear normal.     Left Ear: Tympanic membrane, ear canal and external ear normal.     Mouth/Throat:     Mouth: Mucous membranes are moist.     Pharynx: No oropharyngeal exudate or posterior oropharyngeal erythema.  Eyes:     Conjunctiva/sclera: Conjunctivae normal.  Cardiovascular:     Rate and Rhythm: Normal rate and regular rhythm.  Pulmonary:     Effort: Pulmonary effort is normal. No respiratory distress.     Breath sounds: Normal breath sounds. No wheezing or  rales.  Musculoskeletal:     Cervical back: Neck supple. No tenderness.  Lymphadenopathy:     Cervical: No cervical adenopathy.  Skin:    General: Skin is warm and dry.  Neurological:     Mental Status: She is alert.            Assessment & Plan:    See Problem List for Assessment and Plan of chronic medical problems.

## 2023-11-17 NOTE — Assessment & Plan Note (Signed)
 Acute Likely bacterial given duration and nonimprovement Start Augmentin 875-125 mg BID x 7 day Promethazine -DM cough syrup otc cold medications Rest, fluid Call if no improvement

## 2023-11-17 NOTE — Assessment & Plan Note (Signed)
 New Her daughter is here with her today and expresses concerns regarding her mom's memory Patient denies any memory issues or depression Has not been cooking or eating as well Advised starting a B complex vitamin or B12 vitamin Stressed regular exercise both physical and mental Encouraged her to have the evaluation so that we know if some of which she is experiencing is normal or beyond normal-she agrees-referral ordered

## 2023-11-17 NOTE — Assessment & Plan Note (Signed)
 Chronic Controlled, Stable She denies any depression or anxiety Continue bupropion  xl 150 mg daily, Lexapro  10 mg daily

## 2023-11-17 NOTE — Patient Instructions (Addendum)
       Medications changes include :   augmentin and cough syrup   A referral was ordered for neurology - [facility]    Return if symptoms worsen or fail to improve.

## 2023-11-18 ENCOUNTER — Encounter: Payer: Self-pay | Admitting: Physician Assistant

## 2023-12-21 NOTE — Progress Notes (Signed)
 Assessment/Plan:     Tanya [name] is a very pleasant 78 y.o. year old RH female with a history of hypertension, hyperlipidemia, seen today for evaluation of memory loss. MoCA today is .  Workup is in progress.    Memory Impairment of unclear etiology, concern for ***  MRI brain without contrast to assess for underlying structural abnormality and assess vascular load  Neurocognitive testing to further evaluate cognitive concerns and determine other underlying cause of memory changes, including potential contribution from sleep, anxiety, attention, or depression among others  Check B12, TSH Recommend good control of cardiovascular risk factors.   Continue to control mood as per PCP Folllow up in ***months   Subjective:    The patient is accompanied by ***  who supplements  the history.    How long did patient have memory difficulties?  For about.  Patient reports some difficulty remembering new information, recent conversations, names. repeats oneself?  Endorsed Disoriented when walking into a room? Denies ***  Leaving objects in unusual places?  Denies.   Wandering behavior? Denies.   Any personality changes, or depression, anxiety? Denies *** Hallucinations or paranoia? Denies.   Seizures? Denies.    Any sleep changes?  Sleeps well *** Does not sleep well. **  frequent nightmares or dream reenactment, other REM behavior or sleepwalking   Sleep apnea? Denies.   Any hygiene concerns?  Denies.   Independent of bathing and dressing? Endorsed  Does the patient need help with medications?  is in charge *** Who is in charge of the finances?  is in charge   *** Any changes in appetite?   Denies. ***   Patient have trouble swallowing?  Denies.   Does the patient cook? No*** yes, denies forgetting common recipes or kitchen accidents *** Any headaches?  Denies.   Chronic pain? Denies.   Ambulates with difficulty? Denies. ***  Needs a cane*** Needs a walker *** to ambulate for  stability.   Recent falls or head injuries? Denies.     Vision changes?  Denies any new issues.  Has a history of*** Any strokelike symptoms? Denies.   Any tremors? Denies. *** Any anosmia? Denies.   Any incontinence of urine? Denies.   Any bowel dysfunction? Denies.      Patient lives with ***  History of heavy alcohol intake? Denies.   History of heavy tobacco use? Denies.   Family history of dementia?   *** with dementia  Does patient drive? No longer drives  *** yes, denies getting lost.***   Allergies  Allergen Reactions   Hydromorphone      whelps on skin    Metformin  And Related Other (See Comments)    Lightheaded, neck stiffness   Methocarbamol      Whelps on skin    Oxycodone  Hives    Blister on back   Prednisone      Reaction=wired per patient    Current Outpatient Medications  Medication Instructions   buPROPion  (WELLBUTRIN  XL) 150 mg, Oral, Daily   Calcium Carb-Cholecalciferol (CALCIUM/VITAMIN D ) 500-200 MG-UNIT TABS 1 tablet, Daily   escitalopram  (LEXAPRO ) 10 mg, Oral, Daily   meclizine  (ANTIVERT ) 12.5 mg, Oral, 3 times daily PRN   metroNIDAZOLE  (METROCREAM ) 0.75 % cream No dose, route, or frequency recorded.   Multiple Vitamin (MULTIVITAMIN) tablet 1 tablet, Daily   pantoprazole  (PROTONIX ) 40 mg, Oral, Daily PRN   promethazine -dextromethorphan (PROMETHAZINE -DM) 6.25-15 MG/5ML syrup 5 mLs, Oral, 4 times daily PRN   simvastatin  (ZOCOR ) 20 MG tablet TAKE  1 TABLET BY MOUTH EVERY DAY IN THE EVENING   Vitamin D  (Ergocalciferol ) (DRISDOL ) 50,000 Units, Oral, Every 7 days     VITALS:  There were no vitals filed for this visit.    PHYSICAL EXAM   HEENT:  Normocephalic, atraumatic.  The superficial temporal arteries are without ropiness or tenderness. Cardiovascular: Regular rate and rhythm. Lungs: Clear to auscultation bilaterally. Neck: There are no carotid bruits noted bilaterally.  NEUROLOGICAL:     No data to display              No data to display            Orientation:  Alert and oriented to person, place and not to time***. No aphasia or dysarthria. Fund of knowledge is appropriate. Recent and remote memory impaired.  Attention and concentration are reduced***.  Able to name objects and repeat phrases. *** Delayed recall  /5 .*** Cranial nerves: There is good facial symmetry. Extraocular muscles are intact and visual fields are full to confrontational testing. Speech is fluent and clear. No tongue deviation. Hearing is intact to conversational tone.*** Tone: Tone is good throughout. Sensation: Sensation is intact to light touch.  Vibration is intact at the bilateral big toe.  Coordination: The patient has no difficulty with RAM's or FNF bilaterally. Normal finger to nose  Motor: Strength is 5/5 in the bilateral upper and lower extremities. There is no pronator drift. There are no fasciculations noted. DTR's: Deep tendon reflexes are 2/4 bilaterally. Gait and Station: The patient is able to ambulate without difficulty. Gait is cautious and narrow. Stride length is normal. ***      Thank you for allowing us  the opportunity to participate in the care of this nice patient. Please do not hesitate to contact us  for any questions or concerns.   Total time spent on today's visit was *** minutes dedicated to this patient today, preparing to see patient, examining the patient, ordering tests and/or medications and counseling the patient, documenting clinical information in the EHR or other health record, independently interpreting results and communicating results to the patient/family, discussing treatment and goals, answering patient's questions and coordinating care.  Cc:  [name], MD  Tex Filbert 12/21/2023 9:44 AM

## 2023-12-25 ENCOUNTER — Other Ambulatory Visit

## 2023-12-25 ENCOUNTER — Encounter: Payer: Self-pay | Admitting: Physician Assistant

## 2023-12-25 ENCOUNTER — Ambulatory Visit: Payer: Self-pay

## 2023-12-25 ENCOUNTER — Ambulatory Visit

## 2023-12-25 ENCOUNTER — Other Ambulatory Visit: Payer: Self-pay | Admitting: Internal Medicine

## 2023-12-25 ENCOUNTER — Ambulatory Visit: Admitting: Physician Assistant

## 2023-12-25 VITALS — BP 159/93 | Resp 18 | Ht 64.0 in | Wt 249.0 lb

## 2023-12-25 DIAGNOSIS — R413 Other amnesia: Secondary | ICD-10-CM | POA: Diagnosis not present

## 2023-12-25 MED ORDER — PROMETHAZINE-DM 6.25-15 MG/5ML PO SYRP: 5.0000 mL | ORAL_SOLUTION | Freq: Four times a day (QID) | ORAL | 0 refills | Status: DC | PRN

## 2023-12-25 NOTE — Telephone Encounter (Signed)
 Cough syrup sent in

## 2023-12-25 NOTE — Patient Instructions (Addendum)
 It was a pleasure to see you today at our office.   Recommendations:   MRI of the brain, the radiology office will call you to arrange you appointment   [phone] Check labs today  [address] Follow up in 2 months   For psychiatric meds, mood meds: Please have your primary care physician manage these medications.  If you have any severe symptoms of a stroke, or other severe issues such as confusion,severe chills or fever, etc call 911 or go to the ER as you may need to be evaluated further For guidance regarding [facility] and if placement were needed at the facility, contact Social Worker tel: [phone]  For assessment of decision of mental capacity and competency:  Call Dr. Laverne Potter, geriatric psychiatrist at [phone] Counseling regarding caregiver distress, including caregiver depression, anxiety and issues regarding community resources, adult day care programs, adult living facilities, or memory care questions:  please contact your  Primary Doctor's Social Worker   FOR Memory  decline, memory medications: Call our office [phone]    [email or web address]   RECOMMENDATIONS FOR ALL PATIENTS WITH MEMORY PROBLEMS: 1. Continue to exercise (Recommend 30 minutes of walking everyday, or 3 hours every week) 2. Increase social interactions - continue going to [place] and enjoy social gatherings with friends and family 3. Eat healthy, avoid fried foods and eat more fruits and vegetables 4. Maintain adequate blood pressure, blood sugar, and blood cholesterol level. Reducing the risk of stroke and cardiovascular disease also helps promoting better memory. 5. Avoid stressful situations. Live a simple life and avoid aggravations. Organize your time and prepare for the next day in anticipation. 6. Sleep well, avoid any interruptions of sleep and avoid any distractions in the bedroom that may interfere with  adequate sleep quality 7. Avoid sugar, avoid sweets as there is a strong link between excessive sugar intake, diabetes, and cognitive impairment We discussed the Mediterranean diet, which has been shown to help patients reduce the risk of progressive memory disorders and reduces cardiovascular risk. This includes eating fish, eat fruits and green leafy vegetables, nuts like almonds and hazelnuts, walnuts, and also use olive oil. Avoid fast foods and fried foods as much as possible. Avoid sweets and sugar as sugar use has been linked to worsening of memory function.  There is always a concern of gradual progression of memory problems. If this is the case, then we may need to adjust level of care according to patient needs. Support, both to the patient and caregiver, should then be put into place.      You have been referred for a neuropsychological evaluation (i.e., evaluation of memory and thinking abilities). Please bring someone with you to this appointment if possible, as it is helpful for the doctor to hear from both you and another adult who knows you well. Please bring eyeglasses and hearing aids if you wear them.    The evaluation will take approximately 3 hours and has two parts:   The first part is a clinical interview with the neuropsychologist (Dr. Kitty Perkins or Dr. Donavon Fudge). During the interview, the neuropsychologist will speak with you and the individual you brought to the appointment.    The second part of the evaluation is testing with the doctor's technician Bernabe Brew or Burdette Carolin). During the testing, the technician will ask you to remember different types of material, solve problems, and answer some questionnaires. Your family member will not be present for this portion of the evaluation.   Please note:  We must reserve several hours of the neuropsychologist's time and the psychometrician's time for your evaluation appointment. As such, there is a No-Show fee of $100. If you are unable to attend  any of your appointments, please contact our office as soon as possible to reschedule.      DRIVING: Regarding driving, in patients with progressive memory problems, driving will be impaired. We advise to have someone else do the driving if trouble finding directions or if minor accidents are reported. Independent driving assessment is available to determine safety of driving.   If you are interested in the driving assessment, you can contact the following:  [facility] [employer] [facility] in [place] [phone]  [facility] [phone]  [facility] [phone]  [facility] [phone] or [phone]   FALL PRECAUTIONS: Be cautious when walking. Scan the area for obstacles that may increase the risk of trips and falls. When getting up in the mornings, sit up at the edge of the bed for a few minutes before getting out of bed. Consider elevating the bed at the head end to avoid drop of blood pressure when getting up. Walk always in a well-lit room (use night lights in the walls). Avoid area rugs or power cords from appliances in the middle of the walkways. Use a walker or a cane if necessary and consider physical therapy for balance exercise. Get your eyesight checked regularly.  FINANCIAL OVERSIGHT: Supervision, especially oversight when making financial decisions or transactions is also recommended.  HOME SAFETY: Consider the safety of the kitchen when operating appliances like stoves, microwave oven, and blender. Consider having supervision and share cooking responsibilities until no longer able to participate in those. Accidents with firearms and other hazards in the house should be identified and addressed as well.   ABILITY TO BE LEFT ALONE: If patient is unable to contact 911 operator, consider using LifeLine, or when the need is there, arrange for someone to stay with patients. Smoking is a fire hazard, consider supervision or cessation. Risk of  wandering should be assessed by caregiver and if detected at any point, supervision and safe proof recommendations should be instituted.  MEDICATION SUPERVISION: Inability to self-administer medication needs to be constantly addressed. Implement a mechanism to ensure safe administration of the medications.      Mediterranean Diet A Mediterranean diet refers to food and lifestyle choices that are based on the traditions of countries located on the [place]. This way of eating has been shown to help prevent certain conditions and improve outcomes for people who have chronic diseases, like kidney disease and heart disease. What are tips for following this plan? Lifestyle  Cook and eat meals together with your family, when possible. Drink enough fluid to keep your urine clear or pale yellow. Be physically active every day. This includes: Aerobic exercise like running or swimming. Leisure activities like gardening, walking, or housework. Get 7-8 hours of sleep each night. If recommended by your health care provider, drink red wine in moderation. This means 1 glass a day for nonpregnant women and 2 glasses a day for men. A glass of wine equals 5 oz (150 mL). Reading food labels  Check the serving size of packaged foods. For foods such as rice and pasta, the serving size refers to the amount of cooked product, not dry. Check the total fat in packaged foods. Avoid foods that have saturated fat or trans fats. Check the ingredients list for added sugars, such as corn syrup. Shopping  At  the grocery store, buy most of your food from the areas near the walls of the store. This includes: Fresh fruits and vegetables (produce). Grains, beans, nuts, and seeds. Some of these may be available in unpackaged forms or large amounts (in bulk). Fresh seafood. Poultry and eggs. Low-fat dairy products. Buy whole ingredients instead of prepackaged foods. Buy fresh fruits and vegetables in-season from  local farmers markets. Buy frozen fruits and vegetables in resealable bags. If you do not have access to quality fresh seafood, buy precooked frozen shrimp or canned fish, such as tuna, salmon, or sardines. Buy small amounts of raw or cooked vegetables, salads, or olives from the deli or salad bar at your store. Stock your pantry so you always have certain foods on hand, such as olive oil, canned tuna, canned tomatoes, rice, pasta, and beans. Cooking  Cook foods with extra-virgin olive oil instead of using butter or other vegetable oils. Have meat as a side dish, and have vegetables or grains as your main dish. This means having meat in small portions or adding small amounts of meat to foods like pasta or stew. Use beans or vegetables instead of meat in common dishes like chili or lasagna. Experiment with different cooking methods. Try roasting or broiling vegetables instead of steaming or sauteing them. Add frozen vegetables to soups, stews, pasta, or rice. Add nuts or seeds for added healthy fat at each meal. You can add these to yogurt, salads, or vegetable dishes. Marinate fish or vegetables using olive oil, lemon juice, garlic, and fresh herbs. Meal planning  Plan to eat 1 vegetarian meal one day each week. Try to work up to 2 vegetarian meals, if possible. Eat seafood 2 or more times a week. Have healthy snacks readily available, such as: Vegetable sticks with hummus. Greek yogurt. Fruit and nut trail mix. Eat balanced meals throughout the week. This includes: Fruit: 2-3 servings a day Vegetables: 4-5 servings a day Low-fat dairy: 2 servings a day Fish, poultry, or lean meat: 1 serving a day Beans and legumes: 2 or more servings a week Nuts and seeds: 1-2 servings a day Whole grains: 6-8 servings a day Extra-virgin olive oil: 3-4 servings a day Limit red meat and sweets to only a few servings a month What are my food choices? Mediterranean diet Recommended Grains: Whole-grain  pasta. Brown rice. Bulgar wheat. Polenta. Couscous. Whole-wheat bread. Dwyane Glad. Vegetables: Artichokes. Beets. Broccoli. Cabbage. Carrots. Eggplant. Green beans. Chard. Kale. Spinach. Onions. Leeks. Peas. Squash. Tomatoes. Peppers. Radishes. Fruits: Apples. Apricots. Avocado. Berries. Bananas. Cherries. Dates. Figs. Grapes. Lemons. Melon. Oranges. Peaches. Plums. Pomegranate. Meats and other protein foods: Beans. Almonds. Sunflower seeds. Pine nuts. Peanuts. Cod. Salmon. Scallops. Shrimp. Tuna. Tilapia. Clams. Oysters. Eggs. Dairy: Low-fat milk. Cheese. Greek yogurt. Beverages: Water. Red wine. Herbal tea. Fats and oils: Extra virgin olive oil. Avocado oil. Grape seed oil. Sweets and desserts: [place] yogurt with honey. Baked apples. Poached pears. Trail mix. Seasoning and other foods: Basil. Cilantro. Coriander. Cumin. Mint. Parsley. Sage. Rosemary. Tarragon. Garlic. Oregano. Thyme. Pepper. Balsalmic vinegar. Tahini. Hummus. Tomato sauce. Olives. Mushrooms. Limit these Grains: Prepackaged pasta or rice dishes. Prepackaged cereal with added sugar. Vegetables: Deep fried potatoes (french fries). Fruits: Fruit canned in syrup. Meats and other protein foods: Beef. Pork. Lamb. Poultry with skin. Hot dogs. Helene Loader. Dairy: Ice cream. Sour cream. Whole milk. Beverages: Juice. Sugar-sweetened soft drinks. Beer. Liquor and spirits. Fats and oils: Butter. Canola oil. Vegetable oil. Beef fat (tallow). Lard. Sweets and desserts: Cookies. Cakes.  Pies. Candy. Seasoning and other foods: Mayonnaise. Premade sauces and marinades. The items listed may not be a complete list. Talk with your dietitian about what dietary choices are right for you. Summary The Mediterranean diet includes both food and lifestyle choices. Eat a variety of fresh fruits and vegetables, beans, nuts, seeds, and whole grains. Limit the amount of red meat and sweets that you eat. Talk with your health care provider about whether it is  safe for you to drink red wine in moderation. This means 1 glass a day for nonpregnant women and 2 glasses a day for men. A glass of wine equals 5 oz (150 mL). This information is not intended to replace advice given to you by your health care provider. Make sure you discuss any questions you have with your health care provider. Document Released: 02/22/2016 Document Revised: 03/26/2016 Document Reviewed: 02/22/2016 Elsevier Interactive Patient Education  2017 [employer].

## 2023-12-25 NOTE — Telephone Encounter (Signed)
 FYI Only or Action Required?: FYI only for provider  Patient was last seen in primary care on 11/17/2023 by Tanya Dauphin, MD. Called Nurse Triage reporting Cough. Symptoms began several weeks ago. Interventions attempted: prescription medications (augmentin , promethazine  syrup), OTC cough meds. Symptoms are: unchanged.  Triage Disposition: See PCP When Office is Open (Within 3 Days)  Patient/caregiver understands and will follow disposition?: Yes                Copied from CRM [id number]. Topic: Clinical - Medication Question >> Dec 25, 2023  4:15 PM Tanya [name] F wrote: Reason for CRM: Patient daughter Tanya [name] called, patient was last seen 11/17/23 and is still having a bad cough, would like to know if medication can be prescribed. Also mentioned that neurologist suggested that patient be prescribed trazadone since its hard for her to sleep. Please call Tanya [name] at [phone]. Reason for Disposition  Cough has been present for > 3 weeks  Answer Assessment - Initial Assessment Questions Pt reports ongoing mostly unproductive cough. Pt seen in the office 5/5. Pt took Augmentin  and promethazine  cough syrup which did help. Cough worse at night. Denies difficulty breathing. Pt had a neurology appt today, they advised that she discuss trazodone  with her PCP. Pt reports ongoing difficulty sleeping, averaging 3-4 hrs per night. RN scheduled pt for next Tuesday. Pt wondering if cough medication can be called in for her to hold her over until that appt. Pt states she has tried everything OTC.    1. ONSET: When did the cough begin?      Seen 5/5 2. SEVERITY: How bad is the cough today?      Worse at night  3. SPUTUM: Describe the color of your sputum (none, dry cough; clear, white, yellow, green)     Clear - but she doesn't get rid of it 4. HEMOPTYSIS: Are you coughing up any blood? If so ask: How much? (flecks, streaks, tablespoons, etc.)     No  5. DIFFICULTY BREATHING: Are you  having difficulty breathing? If Yes, ask: How bad is it? (e.g., mild, moderate, severe)    - MILD: No SOB at rest, mild SOB with walking, speaks normally in sentences, can lie down, no retractions, pulse < 100.    - MODERATE: SOB at rest, SOB with minimal exertion and prefers to sit, cannot lie down flat, speaks in phrases, mild retractions, audible wheezing, pulse 100-120.    - SEVERE: Very SOB at rest, speaks in single words, struggling to breathe, sitting hunched forward, retractions, pulse > 120      Denies  6. FEVER: Do you have a fever? If Yes, ask: What is your temperature, how was it measured, and when did it start?     Denies  7. CARDIAC HISTORY: Do you have any history of heart disease? (e.g., heart attack, congestive heart failure)      Denies  8. LUNG HISTORY: Do you have any history of lung disease?  (e.g., pulmonary embolus, asthma, emphysema)     Allergies, pt states that has resolved 9. PE RISK FACTORS: Do you have a history of blood clots? (or: recent major surgery, recent prolonged travel, bedridden)     Denies  10. OTHER SYMPTOMS: Do you have any other symptoms? (e.g., runny nose, wheezing, chest pain)        Taken multiple cough medications, not helpful. Denies wheezing. Denies CP  Prescribed Augmentin  and promethazine  cough syrup (helped her sleep at night, made her sleepy during the day)  Pt saw a neurologist today, they suggest trazodone . Neurology asked pt to discuss trazodone  with PCP. Pt sleeping 3-4 hrs a night  Protocols used: Cough - Acute Non-Productive-A-AH

## 2023-12-26 ENCOUNTER — Other Ambulatory Visit

## 2023-12-26 ENCOUNTER — Ambulatory Visit: Payer: Self-pay | Admitting: Physician Assistant

## 2023-12-26 LAB — VITAMIN B12: Vitamin B-12: 338 pg/mL (ref 200–1100)

## 2023-12-26 LAB — TSH: TSH: 2.9 m[IU]/L (ref 0.40–4.50)

## 2023-12-26 NOTE — Progress Notes (Signed)
 My chart message sent at 10:08 12/26/2023

## 2023-12-27 ENCOUNTER — Ambulatory Visit
Admission: RE | Admit: 2023-12-27 | Discharge: 2023-12-27 | Discharge status: Home / Self Care | Source: Ambulatory Visit | Attending: Physician Assistant | Admitting: Physician Assistant

## 2023-12-29 NOTE — Patient Instructions (Addendum)
    Have a chest x-ray done downstairs.    Medications changes include :   trazodone  50 mg at bedtime and pantoprazole  40 mg daily - take 30 minutes prior to a meal.

## 2023-12-29 NOTE — Progress Notes (Unsigned)
    Subjective:    Patient ID: Tanya [name], female    DOB: 09/25/1945, 78 y.o.   MRN: [id number]      HPI Tanya [name] is here for No chief complaint on file.   Symptoms started 2 months ago.  She was seen here the beginning of May for respiratory symptoms.  I prescribed Augmentin  twice daily x 7 days, promethazine -DM cough syrup.     Medications and allergies reviewed with patient and updated if appropriate.  Current Outpatient Medications on File Prior to Visit  Medication Sig Dispense Refill   buPROPion  (WELLBUTRIN  XL) 150 MG 24 hr tablet Take 1 tablet (150 mg total) by mouth daily. (Patient not taking: Reported on 12/25/2023) 90 tablet 3   Calcium Carb-Cholecalciferol (CALCIUM/VITAMIN D ) 500-200 MG-UNIT TABS Take 1 tablet by mouth daily.     escitalopram  (LEXAPRO ) 10 MG tablet Take 1 tablet (10 mg total) by mouth daily. (Patient not taking: Reported on 12/25/2023) 90 tablet 3   meclizine  (ANTIVERT ) 12.5 MG tablet Take 1 tablet (12.5 mg total) by mouth 3 (three) times daily as needed for dizziness. (Patient not taking: Reported on 12/25/2023) 15 tablet 0   metroNIDAZOLE  (METROCREAM ) 0.75 % cream  (Patient not taking: Reported on 12/25/2023)     Multiple Vitamin (MULTIVITAMIN) tablet Take 1 tablet by mouth daily.     pantoprazole  (PROTONIX ) 40 MG tablet Take 1 tablet (40 mg total) by mouth daily as needed. 90 tablet 2   promethazine -dextromethorphan (PROMETHAZINE -DM) 6.25-15 MG/5ML syrup Take 5 mLs by mouth 4 (four) times daily as needed. 150 mL 0   simvastatin  (ZOCOR ) 20 MG tablet TAKE 1 TABLET BY MOUTH EVERY DAY IN THE EVENING 90 tablet 3   Vitamin D , Ergocalciferol , (DRISDOL ) 1.25 MG (50000 UNIT) CAPS capsule Take 1 capsule (50,000 Units total) by mouth every 7 (seven) days. 5 capsule 1   No current facility-administered medications on file prior to visit.    Review of Systems     Objective:  There were no vitals filed for this visit. BP Readings from Last 3 Encounters:   12/25/23 (!) 159/93  11/17/23 116/72  05/23/23 124/74   Wt Readings from Last 3 Encounters:  12/25/23 249 lb (112.9 kg)  11/17/23 248 lb (112.5 kg)  05/23/23 229 lb (103.9 kg)   There is no height or weight on file to calculate BMI.    Physical Exam         Assessment & Plan:    See Problem List for Assessment and Plan of chronic medical problems.

## 2023-12-30 ENCOUNTER — Encounter: Payer: Self-pay | Admitting: Internal Medicine

## 2023-12-30 ENCOUNTER — Ambulatory Visit: Admitting: Internal Medicine

## 2023-12-30 ENCOUNTER — Ambulatory Visit: Payer: Self-pay | Admitting: Internal Medicine

## 2023-12-30 ENCOUNTER — Ambulatory Visit (INDEPENDENT_AMBULATORY_CARE_PROVIDER_SITE_OTHER)

## 2023-12-30 VITALS — BP 132/80 | HR 80 | Temp 98.2°F | Ht 64.0 in | Wt 247.0 lb

## 2023-12-30 DIAGNOSIS — R052 Subacute cough: Secondary | ICD-10-CM | POA: Diagnosis not present

## 2023-12-30 DIAGNOSIS — K219 Gastro-esophageal reflux disease without esophagitis: Secondary | ICD-10-CM

## 2023-12-30 DIAGNOSIS — R1310 Dysphagia, unspecified: Secondary | ICD-10-CM

## 2023-12-30 DIAGNOSIS — G4709 Other insomnia: Secondary | ICD-10-CM | POA: Diagnosis not present

## 2023-12-30 DIAGNOSIS — J22 Unspecified acute lower respiratory infection: Secondary | ICD-10-CM

## 2023-12-30 MED ORDER — PANTOPRAZOLE SODIUM 40 MG PO TBEC: 40.0000 mg | DELAYED_RELEASE_TABLET | Freq: Every day | ORAL | 3 refills | Status: AC

## 2023-12-30 MED ORDER — AZITHROMYCIN 250 MG PO TABS
ORAL_TABLET | ORAL | 0 refills | Status: DC
Start: 2023-12-30 — End: 2024-02-02

## 2023-12-30 MED ORDER — CEFPODOXIME PROXETIL 200 MG PO TABS: 200.0000 mg | ORAL_TABLET | Freq: Two times a day (BID) | ORAL | 0 refills | Status: AC

## 2023-12-30 MED ORDER — TRAZODONE HCL 50 MG PO TABS: 50.0000 mg | ORAL_TABLET | Freq: Every evening | ORAL | 3 refills | Status: DC | PRN

## 2023-12-30 NOTE — Assessment & Plan Note (Signed)
 Subacute She was here last month for cold symptoms-treated with Augmentin  x 7 days and cough syrup Still has a residual dry cough-feels like she has a tickle in her throat ?  GERD related-start pantoprazole  40 mg daily, especially given mild dysphagia Chest x-ray today did show questionable pneumonia-will treat with Vantin 200 mg twice daily x 7 days and Z-Pak

## 2023-12-30 NOTE — Assessment & Plan Note (Signed)
 Chronic Has not been taking pantoprazole  Has GERD symptoms a few times a month Does have slight dysphagia at times and a sensation of food getting stuck in her upper chest Did have her esophagus dilated once several years ago Having dry cough Concerned that her cough may be caused by GERD and also needs to take the pantoprazole  for the dysphagia Restart pantoprazole  40 mg daily

## 2023-12-30 NOTE — Assessment & Plan Note (Signed)
 Chronic She has never slept well We have tried several medications in the past that have not worked Retry trazodone  50 mg nightly She will work on increasing her exercise/activity during the day and hopefully that will help

## 2023-12-30 NOTE — Assessment & Plan Note (Signed)
 History of dysphagia and history of esophageal dilation x 1 in 2020 She does states some sensation of food getting stuck in her upper chest at times She does chew her food thoroughly Has occasional GERD symptoms, but concerned that she may have more GERD than she realizes Restart pantoprazole  40 mg daily

## 2024-01-02 ENCOUNTER — Telehealth: Payer: Self-pay | Admitting: Internal Medicine

## 2024-01-02 NOTE — Telephone Encounter (Unsigned)
 Copied from CRM [id number]. Topic: Clinical - Prescription Issue >> Jan 02, 2024  2:42 PM Dewanda Foots wrote: Reason for CRM: Daughter Cain Castillo states that pt was prescribed the z-pac azithromycin (ZITHROMAX) 250 MG on 6/18 but is missing 2 of the 5 pills and is requesting we send in the rest of them. States she is not sure what happened to them and even searched in the trash cans to find them but they are not there either.  [id number] [place], [place] - [address] [place] [address] Phone: [phone]  Fax: [phone] DEA #: [id number] DAW Reason: --  Andrea's callback is [phone] when this is completed pleased

## 2024-01-05 MED ORDER — AZITHROMYCIN 250 MG PO TABS
250.0000 mg | ORAL_TABLET | Freq: Every day | ORAL | 0 refills | Status: AC
Start: 2024-01-05 — End: 2024-01-07

## 2024-01-05 NOTE — Telephone Encounter (Signed)
 Spoke with daughter today.  Additional pills sent in today.

## 2024-01-05 NOTE — Telephone Encounter (Signed)
 If she is only missing 2 pills I will send in 2 pills-pending.

## 2024-01-21 ENCOUNTER — Other Ambulatory Visit: Payer: Self-pay | Admitting: Internal Medicine

## 2024-02-02 ENCOUNTER — Ambulatory Visit: Admitting: Family Medicine

## 2024-02-02 ENCOUNTER — Ambulatory Visit: Payer: Self-pay

## 2024-02-02 ENCOUNTER — Encounter: Payer: Self-pay | Admitting: Family Medicine

## 2024-02-02 VITALS — BP 152/108 | HR 79 | Temp 98.2°F | Resp 18 | Ht 64.0 in | Wt 246.0 lb

## 2024-02-02 DIAGNOSIS — J988 Other specified respiratory disorders: Secondary | ICD-10-CM | POA: Diagnosis not present

## 2024-02-02 DIAGNOSIS — B9689 Other specified bacterial agents as the cause of diseases classified elsewhere: Secondary | ICD-10-CM | POA: Diagnosis not present

## 2024-02-02 MED ORDER — PROMETHAZINE-DM 6.25-15 MG/5ML PO SYRP: 5.0000 mL | ORAL_SOLUTION | Freq: Four times a day (QID) | ORAL | 0 refills | Status: DC | PRN

## 2024-02-02 MED ORDER — AMOXICILLIN-POT CLAVULANATE 875-125 MG PO TABS: 1.0000 | ORAL_TABLET | Freq: Two times a day (BID) | ORAL | 0 refills | Status: DC

## 2024-02-02 NOTE — Telephone Encounter (Signed)
 FYI Only or Action Required?: FYI only for provider.  Patient was last seen in primary care on 12/30/2023 by Geofm Glade PARAS, MD.  Called Nurse Triage reporting Cough.  Symptoms began a week ago.  Interventions attempted: OTC medications: mucinex.  Symptoms are: unchanged.  Triage Disposition: See HCP Within 4 Hours (Or PCP Triage)  Patient/caregiver understands and will follow disposition?: Yes           Copied from CRM [id number]. Topic: Clinical - Red Word Triage >> Feb 02, 2024 12:08 PM Gennette ORN wrote: Red Word that prompted transfer to Nurse Triage: Patient daughter [name] calling in due to her mom being sick. She had pneumonia a month ago. They don't if it's coming back or not. She is now having chest congestion greenish color mucous , feeling weak, she can walk but is in pain when she does.She has a cough as well. She did have a fever last week as well. Reason for Disposition  [1] MILD difficulty breathing (e.g., minimal/no SOB at rest, SOB with walking, pulse < 100) AND [2] still present when not coughing  Answer Assessment - Initial Assessment Questions 1. ONSET: When did the cough begin?      A week and half 2. SEVERITY: How bad is the cough today?      Mild-mod 3. SPUTUM: Describe the color of your sputum (e.g., none, dry cough; clear, white, yellow, green)     green 4. HEMOPTYSIS: Are you coughing up any blood? If Yes, ask: How much? (e.g., flecks, streaks, tablespoons, etc.)     no 5. DIFFICULTY BREATHING: Are you having difficulty breathing? If Yes, ask: How bad is it? (e.g., mild, moderate, severe)      Mild-gets winded when walking distances 6. FEVER: Do you have a fever? If Yes, ask: What is your temperature, how was it measured, and when did it start?     Fever a few days ago  7. CARDIAC HISTORY: Do you have any history of heart disease? (e.g., heart attack, congestive heart failure)      no 8. LUNG HISTORY: Do you have any history of  lung disease?  (e.g., pulmonary embolus, asthma, emphysema)     no 9. PE RISK FACTORS: Do you have a history of blood clots? (or: recent major surgery, recent prolonged travel, bedridden)     no 10. OTHER SYMPTOMS: Do you have any other symptoms? (e.g., runny nose, wheezing, chest pain)       Fatigue, no appetite, chest congestion and joint pain, headache  Protocols used: Cough - Acute Productive-A-AH

## 2024-02-02 NOTE — Progress Notes (Signed)
 Assessment & Plan:  1. Bacterial respiratory infection (Primary) Education provided on URIs. Continue symptom management. Encouraged adequate hydration.  - promethazine -dextromethorphan (PROMETHAZINE -DM) 6.25-15 MG/5ML syrup; Take 5 mLs by mouth 4 (four) times daily as needed.  Dispense: 150 mL; Refill: 0 - amoxicillin -clavulanate (AUGMENTIN ) 875-125 MG tablet; Take 1 tablet by mouth 2 (two) times daily for 7 days.  Dispense: 14 tablet; Refill: 0    Follow up plan: Return if symptoms worsen or fail to improve.  Niki Rung, MSN, APRN, FNP-C  Subjective:  HPI: Tanya [name] is a 78 y.o. female presenting on 02/02/2024 for Cough (Fatigue, Cough, congestion- green, fever about 5 days ago. /X 1.5 weeks - symptoms have varied. PNA 6/17, and then this)  Patient is accompanied by her daughter, who she is okay with being present.  Patient complains of cough, head congestion, fever, and fatigue. She denies shortness of breath and wheezing. Onset of symptoms was 10 days ago, unchanged since that time. She is drinking moderate amounts of fluids. Evaluation to date: none. Treatment to date: Mucinex Day and Night, Delsym. She does not smoke.    ROS: Negative unless specifically indicated above in HPI.   Relevant past medical history reviewed and updated as indicated.   Allergies and medications reviewed and updated.   Current Outpatient Medications:    simvastatin  (ZOCOR ) 20 MG tablet, TAKE 1 TABLET BY MOUTH EVERY DAY IN THE EVENING, Disp: 90 tablet, Rfl: 3   traZODone  (DESYREL ) 50 MG tablet, TAKE 1 TABLET BY MOUTH AT BEDTIME AS NEEDED FOR SLEEP., Disp: 90 tablet, Rfl: 2   Multiple Vitamin (MULTIVITAMIN) tablet, Take 1 tablet by mouth daily. (Patient not taking: Reported on 02/02/2024), Disp: , Rfl:    pantoprazole  (PROTONIX ) 40 MG tablet, Take 1 tablet (40 mg total) by mouth daily. Take 30 minutes prior to a meal (Patient not taking: Reported on 02/02/2024), Disp: 90 tablet, Rfl:  3  Allergies  Allergen Reactions   Hydromorphone      whelps on skin    Metformin  And Related Other (See Comments)    Lightheaded, neck stiffness   Methocarbamol      Whelps on skin    Oxycodone  Hives    Blister on back   Prednisone      Reaction=wired per patient    Objective:   BP (!) 152/108   Pulse 79   Temp 98.2 F (36.8 C)   Resp 18   Ht 5' 4 (1.626 m)   Wt 246 lb (111.6 kg)   SpO2 96%   BMI 42.23 kg/m    Physical Exam Vitals reviewed.  Constitutional:      General: She is not in acute distress.    Appearance: Normal appearance. She is not ill-appearing, toxic-appearing or diaphoretic.  HENT:     Head: Normocephalic and atraumatic.     Right Ear: Tympanic membrane, ear canal and external ear normal. There is no impacted cerumen.     Left Ear: Tympanic membrane, ear canal and external ear normal. There is no impacted cerumen.     Nose: Congestion present. No rhinorrhea.     Right Sinus: No maxillary sinus tenderness or frontal sinus tenderness.     Left Sinus: No maxillary sinus tenderness or frontal sinus tenderness.     Mouth/Throat:     Mouth: Mucous membranes are moist.     Pharynx: Oropharynx is clear. Posterior oropharyngeal erythema present. No oropharyngeal exudate.  Eyes:     General: No scleral icterus.  Right eye: No discharge.        Left eye: No discharge.     Conjunctiva/sclera: Conjunctivae normal.  Cardiovascular:     Rate and Rhythm: Normal rate and regular rhythm.     Heart sounds: Normal heart sounds. No murmur heard.    No friction rub. No gallop.  Pulmonary:     Effort: Pulmonary effort is normal. No respiratory distress.     Breath sounds: Normal breath sounds. No stridor. No wheezing, rhonchi or rales.  Musculoskeletal:        General: Normal range of motion.     Cervical back: Normal range of motion.  Lymphadenopathy:     Cervical: No cervical adenopathy.  Skin:    General: Skin is warm and dry.     Capillary Refill:  Capillary refill takes less than 2 seconds.  Neurological:     General: No focal deficit present.     Mental Status: She is alert and oriented to person, place, and time. Mental status is at baseline.  Psychiatric:        Mood and Affect: Mood normal.        Behavior: Behavior normal.        Thought Content: Thought content normal.        Judgment: Judgment normal.

## 2024-02-09 ENCOUNTER — Ambulatory Visit: Payer: Self-pay

## 2024-02-09 ENCOUNTER — Ambulatory Visit (INDEPENDENT_AMBULATORY_CARE_PROVIDER_SITE_OTHER)

## 2024-02-09 ENCOUNTER — Encounter: Payer: Self-pay | Admitting: Family Medicine

## 2024-02-09 ENCOUNTER — Ambulatory Visit: Admitting: Family Medicine

## 2024-02-09 VITALS — BP 138/92 | HR 88 | Temp 97.8°F | Resp 18 | Ht 64.0 in | Wt 246.0 lb

## 2024-02-09 DIAGNOSIS — R531 Weakness: Secondary | ICD-10-CM | POA: Diagnosis not present

## 2024-02-09 DIAGNOSIS — R051 Acute cough: Secondary | ICD-10-CM | POA: Diagnosis not present

## 2024-02-09 DIAGNOSIS — Z8701 Personal history of pneumonia (recurrent): Secondary | ICD-10-CM | POA: Diagnosis not present

## 2024-02-09 DIAGNOSIS — R42 Dizziness and giddiness: Secondary | ICD-10-CM

## 2024-02-09 LAB — COMPREHENSIVE METABOLIC PANEL WITH GFR
ALT: 18 U/L (ref 0–35)
AST: 21 U/L (ref 0–37)
Albumin: 4.6 g/dL (ref 3.5–5.2)
Alkaline Phosphatase: 83 U/L (ref 39–117)
BUN: 18 mg/dL (ref 6–23)
CO2: 29 meq/L (ref 19–32)
Calcium: 9.9 mg/dL (ref 8.4–10.5)
Chloride: 100 meq/L (ref 96–112)
Creatinine, Ser: 0.82 mg/dL (ref 0.40–1.20)
GFR: 67.57 mL/min (ref >60.00)
Glucose, Bld: 89 mg/dL (ref 70–99)
Potassium: 4.1 meq/L (ref 3.5–5.1)
Sodium: 139 meq/L (ref 135–145)
Total Bilirubin: 0.5 mg/dL (ref 0.2–1.2)
Total Protein: 7.7 g/dL (ref 6.0–8.3)

## 2024-02-09 LAB — CBC WITH DIFFERENTIAL/PLATELET
Basophils Absolute: 0.1 K/uL (ref 0.0–0.1)
Basophils Relative: 1.2 % (ref 0.0–3.0)
Eosinophils Absolute: 0.3 K/uL (ref 0.0–0.7)
Eosinophils Relative: 2.6 % (ref 0.0–5.0)
HCT: 47.1 % — ABNORMAL HIGH (ref 36.0–46.0)
Hemoglobin: 15.7 g/dL — ABNORMAL HIGH (ref 12.0–15.0)
Lymphocytes Relative: 30.2 % (ref 12.0–46.0)
Lymphs Abs: 2.9 K/uL (ref 0.7–4.0)
MCHC: 33.3 g/dL (ref 30.0–36.0)
MCV: 90.2 fl (ref 78.0–100.0)
Monocytes Absolute: 0.8 K/uL (ref 0.1–1.0)
Monocytes Relative: 8.7 % (ref 3.0–12.0)
Neutro Abs: 5.5 K/uL (ref 1.4–7.7)
Neutrophils Relative %: 57.3 % (ref 43.0–77.0)
Platelets: 422 K/uL — ABNORMAL HIGH (ref 150.0–400.0)
RBC: 5.23 Mil/uL — ABNORMAL HIGH (ref 3.87–5.11)
RDW: 13.3 % (ref 11.5–15.5)
WBC: 9.6 K/uL (ref 4.0–10.5)

## 2024-02-09 MED ORDER — MECLIZINE HCL 12.5 MG PO TABS: 12.5000 mg | ORAL_TABLET | Freq: Three times a day (TID) | ORAL | 0 refills | Status: DC | PRN

## 2024-02-09 NOTE — Progress Notes (Signed)
 Assessment & Plan:  Weakness (Primary)/Acute cough/History of recent pneumonia/Dizziness Discussed symptoms of dehydration deconditioning after having pneumonia.  Labs and chest x-ray to look for other causes for her symptoms.  Encouraged patient to try getting up and moving around more often.  Continue pushing fluids for adequate hydration.  Discussed IV hydration and encouraged patient to consider. - Comprehensive metabolic panel with GFR; Future - CBC with Differential/Platelet; Future - TSH; Future - Vitamin B12; Future - DG Chest 2 View; Future  Dizziness Discussed that dizziness is definitely a symptom of dehydration but I did send in a prescription for meclizine  due to her history of vertigo should she need it. - Comprehensive metabolic panel with GFR; Future - CBC with Differential/Platelet; Future - TSH; Future - Vitamin B12; Future - meclizine  (ANTIVERT ) 12.5 MG tablet; Take 1-2 tablets (12.5-25 mg total) by mouth 3 (three) times daily as needed for dizziness.  Dispense: 30 tablet; Refill: 0   Follow up plan: Return if symptoms worsen or fail to improve.  Niki Rung, MSN, APRN, FNP-C  Subjective:  HPI: Tanya [name] is a 78 y.o. female presenting on 02/09/2024 for Cough (Seems to not be any better. Still feels tired, weak, dizziness, and SOB with exertion. Completed Augmentin  and still has a little syrup. /Would like to have a CXR )  Patient is accompanied by her daughter, who she is okay with being present.  Patient continues to not feel well.  This has been ongoing since early last month when she was diagnosed and treated with pneumonia.  She was treated for a bacterial upper respiratory infection with Augmentin  last week.  She denies any improvement in her symptoms.  She continues to feel weak, dizzy, and fatigued.  She has a cough and is experiencing shortness of breath with exertion.  She is trying to drink more water.  She reports she is still urinating normal  amounts and her urine is light yellow in color.  She does have a history of vertigo but states this dizziness is different.    ROS: Negative unless specifically indicated above in HPI.   Relevant past medical history reviewed and updated as indicated.   Allergies and medications reviewed and updated.   Current Outpatient Medications:    promethazine -dextromethorphan (PROMETHAZINE -DM) 6.25-15 MG/5ML syrup, Take 5 mLs by mouth 4 (four) times daily as needed., Disp: 150 mL, Rfl: 0   simvastatin  (ZOCOR ) 20 MG tablet, TAKE 1 TABLET BY MOUTH EVERY DAY IN THE EVENING, Disp: 90 tablet, Rfl: 3   traZODone  (DESYREL ) 50 MG tablet, TAKE 1 TABLET BY MOUTH AT BEDTIME AS NEEDED FOR SLEEP., Disp: 90 tablet, Rfl: 2   Multiple Vitamin (MULTIVITAMIN) tablet, Take 1 tablet by mouth daily. (Patient not taking: Reported on 02/09/2024), Disp: , Rfl:    pantoprazole  (PROTONIX ) 40 MG tablet, Take 1 tablet (40 mg total) by mouth daily. Take 30 minutes prior to a meal (Patient not taking: Reported on 02/09/2024), Disp: 90 tablet, Rfl: 3  Allergies  Allergen Reactions   Hydromorphone      whelps on skin    Metformin  And Related Other (See Comments)    Lightheaded, neck stiffness   Methocarbamol      Whelps on skin    Oxycodone  Hives    Blister on back   Prednisone      Reaction=wired per patient    Objective:   BP (!) 138/92   Pulse 88   Temp 97.8 F (36.6 C)   Resp 18   Ht 5' 4 (  1.626 m)   Wt 246 lb (111.6 kg)   SpO2 97%   BMI 42.23 kg/m    Physical Exam Vitals reviewed.  Constitutional:      General: She is not in acute distress.    Appearance: Normal appearance. She is not ill-appearing, toxic-appearing or diaphoretic.  HENT:     Head: Normocephalic and atraumatic.  Eyes:     General: No scleral icterus.       Right eye: No discharge.        Left eye: No discharge.     Conjunctiva/sclera: Conjunctivae normal.  Cardiovascular:     Rate and Rhythm: Normal rate and regular rhythm.      Heart sounds: Normal heart sounds. No murmur heard.    No friction rub. No gallop.  Pulmonary:     Effort: Pulmonary effort is normal. No respiratory distress.     Breath sounds: Normal breath sounds. No stridor. No wheezing, rhonchi or rales.  Musculoskeletal:        General: Normal range of motion.     Cervical back: Normal range of motion.  Skin:    General: Skin is warm and dry.     Capillary Refill: Capillary refill takes less than 2 seconds.  Neurological:     General: No focal deficit present.     Mental Status: She is alert and oriented to person, place, and time. Mental status is at baseline.  Psychiatric:        Mood and Affect: Mood normal.        Behavior: Behavior normal.        Thought Content: Thought content normal.        Judgment: Judgment normal.

## 2024-02-09 NOTE — Patient Instructions (Signed)
 Consider IV hydration.

## 2024-02-09 NOTE — Telephone Encounter (Signed)
 FYI Only or Action Required?: FYI only for provider.  Patient was last seen in primary care on 02/02/2024 by Merlynn Niki FALCON, FNP.  Called Nurse Triage reporting Cough.  Symptoms began 12/30/2023.  Interventions attempted: Prescription medications: ABX.  Symptoms are: gradually worsening.  Triage Disposition: See PCP When Office is Open (Within 3 Days)  Patient/caregiver understands and will follow disposition?: Yes   Copied from CRM #[id number]. Topic: Clinical - Red Word Triage >> Feb 09, 2024  7:54 AM Suzen RAMAN wrote: Red Word that prompted transfer to Nurse Triage: productive cough,headache,weak, no appetite ongoing for 1 month with traeatment Reason for Disposition  Cough has been present for > 3 weeks  Answer Assessment - Initial Assessment Questions 1. ONSET: When did the cough begin?      12/30/2023 and worsening 2. SEVERITY: How bad is the cough today?      moderate 3. SPUTUM: Describe the color of your sputum (e.g., none, dry cough; clear, white, yellow, green)     unknown 4. HEMOPTYSIS: Are you coughing up any blood? If Yes, ask: How much? (e.g., flecks, streaks, tablespoons, etc.)     na 5. DIFFICULTY BREATHING: Are you having difficulty breathing? If Yes, ask: How bad is it? (e.g., mild, moderate, severe)      unknown 6. FEVER: Do you have a fever? If Yes, ask: What is your temperature, how was it measured, and when did it start?     no 7. CARDIAC HISTORY: Do you have any history of heart disease? (e.g., heart attack, congestive heart failure)      na 8. LUNG HISTORY: Do you have any history of lung disease?  (e.g., pulmonary embolus, asthma, emphysema)     na 9. PE RISK FACTORS: Do you have a history of blood clots? (or: recent major surgery, recent prolonged travel, bedridden)     na 10. OTHER SYMPTOMS: Do you have any other symptoms? (e.g., runny nose, wheezing, chest pain)       Headache, weak, cough, no appetite 11. PREGNANCY: Is  there any chance you are pregnant? When was your last menstrual period?       na 12. TRAVEL: Have you traveled out of the country in the last month? (e.g., travel history, exposures)       Na Pt has been on ABX x 1 month: pt s/s is worsening.  Protocols used: Cough - Acute Non-Productive-A-AH

## 2024-02-10 LAB — TSH: TSH: 1.92 u[IU]/mL (ref 0.35–5.50)

## 2024-02-10 LAB — VITAMIN B12: Vitamin B-12: 215 pg/mL (ref 211–911)

## 2024-02-12 ENCOUNTER — Ambulatory Visit: Payer: Self-pay | Admitting: Family Medicine

## 2024-02-12 DIAGNOSIS — R911 Solitary pulmonary nodule: Secondary | ICD-10-CM

## 2024-02-16 ENCOUNTER — Telehealth: Payer: Self-pay

## 2024-02-16 NOTE — Telephone Encounter (Signed)
 CRITICAL VALUE STICKER  CRITICAL VALUE: Chest x-ray, IMPRESSION: 1. Similar-appearing heterogeneous opacities right lung base. 2. Nodular area of consolidation left lung base measuring up to 1.9 cm. Recommend further evaluation with chest CT.  **[facility] called to make sure these results were seen, Niki Rung has addressed and placed chest CT  RECEIVER (on-site recipient of call): Chiquita  DATE & TIME NOTIFIED: 02/16/24, 10:46 am  MESSENGER (representative from lab): [facility]

## 2024-02-17 NOTE — Telephone Encounter (Signed)
 Spoke with Tanya [name] today and questions answered.

## 2024-02-17 NOTE — Telephone Encounter (Signed)
 Copied from CRM #[id number]. Topic: Clinical - Lab/Test Results >> Feb 17, 2024 12:24 PM Pinkey ORN wrote: Patient's daughter [name] is requesting a call back in regards to CT results. Call back number is [phone]

## 2024-02-20 ENCOUNTER — Ambulatory Visit
Admission: RE | Admit: 2024-02-20 | Discharge: 2024-02-20 | Disposition: A | Discharge status: Home / Self Care | Source: Ambulatory Visit | Attending: Family Medicine | Admitting: Family Medicine

## 2024-02-20 ENCOUNTER — Ambulatory Visit: Payer: Self-pay | Admitting: Family Medicine

## 2024-02-20 DIAGNOSIS — R911 Solitary pulmonary nodule: Secondary | ICD-10-CM

## 2024-02-20 DIAGNOSIS — R918 Other nonspecific abnormal finding of lung field: Secondary | ICD-10-CM

## 2024-02-23 NOTE — Telephone Encounter (Signed)
 Copied from CRM [id number]. Topic: Clinical - Lab/Test Results >> Feb 23, 2024  9:44 AM Viola FALCON wrote: Reason for CRM: Patient daughter [name] called regarding CT results, would like a call back from [place] today [phone]

## 2024-02-23 NOTE — Telephone Encounter (Signed)
Referral was ordered. 

## 2024-02-23 NOTE — Telephone Encounter (Signed)
 Copied from CRM [id number]. Topic: General - Other >> Feb 23, 2024  1:21 PM Sophia H wrote: Reason for CRM: Wanting to follow up on message to nurse, allow more time >> Feb 23, 2024  4:00 PM Suzen RAMAN wrote: Patient called back requesting to speak Tobias pertaining to imaging results(CT SCAN). Patient states she has not received a call back from previous request from this morning. Please contact patient to further discuss.

## 2024-02-25 ENCOUNTER — Encounter: Payer: Self-pay | Admitting: Medical Oncology

## 2024-02-26 NOTE — Progress Notes (Signed)
 [facility] [phone]   [phone]  INITIAL CONSULTATION:  Patient Care Team: Geofm Glade PARAS, MD as PCP - General (Internal Medicine) Cleatus Collar, MD as Consulting Physician (Ophthalmology) Rozella, Toribio BROCKS, [facility] (Inactive) as Pharmacist (Pharmacist)  CHIEF COMPLAINTS/PURPOSE OF CONSULTATION:  Pulmonary nodules and abdominal lymphadenopathy  HISTORY OF PRESENTING ILLNESS:  Tanya [name] 78 y.o. female with medical history significant for GERD, nontoxic multinodular goiter, essential tremor, herpes zoster, osteopenia, hyperlipidemia, skin cancer, breast cancer s/p bilateral mastectomy 1986, hypertensive retinopathy.  On review of the previous records patient is being referred by PCP Niki Rung FNP for pulmonary nodules and abdominal lymphadenopathy. Patient was seen first seen by PCP in May 2025 for respiratory symptoms.  She was treated with a course of Augmentin .  Patient had follow-up visit on 12/30/2023 for continued cough.  Chest x-ray was performed on 12/30/2023 showing mild right basilar patchy density, suspicious for pneumonia.  Patient had subsequent follow-up visit with PCP on 02/02/2024 and was treated with a course of Augmentin  for bacterial respiratory infection. Chest x-ray was performed again on 02/09/24 showing similar-appearing heterogeneous opacities right lung base. As well as nodular area of consolidation left lung base measuring up to 1.9 cm. Patient had CT chest for further evaluation on 02/20/24.  Patient had CT chest without contrast on 02/20/2024 for follow-up on lung nodules.  Imaging shows scattered pulmonary micronodules, indeterminate.  Radiologist comments metastasis cannot be excluded.  Also few prominent lymph nodes in the upper mid abdomen inferior to the pancreatic body indeterminate, reactive versus metastatic. Lymph nodes in the upper abdomen are measured up to 1 cm. Patient is scheduled to for  initial consult with pulmonology on 04/07/24.  On exam today patient is accompanied by her spouse and daughter who provide additional information.  Patient states that she is still feeling unwell ever since her symptoms started in May of this year.  She has not had any recent fevers.  She continues to have a productive cough with clear sputum, mild sore throat, and postnasal drip.  Denies any significant congestion.  She denies noticing any improvement after multiple rounds of antibiotics since symptom onset. She has a history of seasonal allergies although is not bothered by that recently, does not take OTC antihistamine. She is also endorsing fatigue and shortness of breath with exertion.  Denies any wheezing.  She does admit to decreased appetite over the last month and denies any weight loss. She has not noticed any enlarged lymph nodes.  Patient adds she has a longstanding history of GERD.  She tried Protonix  in the past however stopped because it was causing diarrhea.  She has not tried any other medications for this.  Further discussion reveals patient is a retired Production designer, theatre/television/film of an Careers adviser.  Her last mammogram was in 2023 and was normal.  She believes her last colonoscopy was 5 years ago and also normal.  Her last Pap smear was in 2020 and was normal as well.  Patient is a never smoker and does not drink alcohol.  MEDICAL HISTORY:  Past Medical History:  Diagnosis Date   Arthritis    Bursitis 2014   Left; Dr Hiram   Cancer [facility]) 1986   breast cancer L   Hyperlipidemia    Lymphedema 2000   LUE   perennial allergies    PONV (postoperative nausea and vomiting)    Skin cancer    basil cell   Vertigo     SURGICAL  HISTORY: Past Surgical History:  Procedure Laterality Date   ABDOMINAL HYSTERECTOMY  1990   & BSO for fibroids   ANKLE FRACTURE SURGERY  2006   BREAST RECONSTRUCTION  1986   BREAST SURGERY  1986   double mastectonmy fror cancer on L   CATARACT EXTRACTION Bilateral     COLONOSCOPY  04/14/2006   Tics ; Dr Luis   KNEE ARTHROSCOPY  2001 & 2011   L & R   THYROID  SURGERY     nodule on thyroid    TOTAL KNEE ARTHROPLASTY  02/24/2012   Procedure: TOTAL KNEE ARTHROPLASTY;  Surgeon: Dempsey LULLA Moan, MD;  Location: [facility];  Service: Orthopedics;  Laterality: Right;   TOTAL KNEE ARTHROPLASTY Left 02/01/2019   Procedure: TOTAL KNEE ARTHROPLASTY;  Surgeon: Moan Dempsey, MD;  Location: [facility];  Service: Orthopedics;  Laterality: Left;    WISDOM TOOTH EXTRACTION      SOCIAL HISTORY: Social History   Socioeconomic History   Marital status: Married    Spouse name: Deward   Number of children: 1   Years of education: 14   Highest education level: Not on file  Occupational History   Occupation: Retired  Tobacco Use   Smoking status: Never   Smokeless tobacco: Never  Vaping Use   Vaping status: Never Used  Substance and Sexual Activity   Alcohol use: Yes    Comment:  very rarely, 1-2 per month   Drug use: No   Sexual activity: Not Currently  Other Topics Concern   Not on file  Social History Narrative   Lives with husband.    Right handed   Drinks caffeine prn   Has one daughter   Two story home   Social Drivers of Health   Financial Resource Strain: Low Risk  (05/22/2023)   Overall Financial Resource Strain (CARDIA)    Difficulty of Paying Living Expenses: Not hard at all  Food Insecurity: No Food Insecurity (05/22/2023)   Hunger Vital Sign    Worried About Running Out of Food in the Last Year: Never true    Ran Out of Food in the Last Year: Never true  Transportation Needs: No Transportation Needs (05/22/2023)   PRAPARE - Administrator, Civil Service (Medical): No    Lack of Transportation (Non-Medical): No  Physical Activity: Sufficiently Active (05/22/2023)   Exercise Vital Sign    Days of Exercise per Week: 5 days    Minutes of Exercise per Session: 40 min  Stress: No Stress Concern Present (05/22/2023)   [employer] of Occupational Health - Occupational Stress Questionnaire    Feeling of Stress : Not at all  Social Connections: Moderately Integrated (05/22/2023)   Social Connection and Isolation Panel    Frequency of Communication with Friends and Family: Twice a week    Frequency of Social Gatherings with Friends and Family: Once a week    Attends Religious Services: More than 4 times per year    Active Member of [employer] or Organizations: No    Attends Banker Meetings: Never    Marital Status: Married  Catering manager Violence: Not At Risk (05/22/2023)   Humiliation, Afraid, Rape, and Kick questionnaire    Fear of Current or Ex-Partner: No    Emotionally Abused: No    Physically Abused: No    Sexually Abused: No    FAMILY HISTORY: Family History  Problem Relation Age of Onset   Hyperlipidemia Mother    Hypertension Mother  Heart disease Mother        pacer   Colon polyps Mother        pre cancerous   Glaucoma Mother    Dementia Mother    Anxiety disorder Mother    Heart attack Maternal Grandmother 50   Hypertension Maternal Grandmother    Stroke Maternal Grandfather 88   Hypertension Maternal Grandfather    Heart attack Maternal Uncle 65   Cancer Neg Hx    Colon cancer Neg Hx    Esophageal cancer Neg Hx    Rectal cancer Neg Hx    Stomach cancer Neg Hx    Pancreatic cancer Neg Hx     ALLERGIES:  is allergic to hydromorphone , metformin  and related, methocarbamol , oxycodone , and prednisone .  MEDICATIONS:  Current Outpatient Medications  Medication Sig Dispense Refill   famotidine  (PEPCID ) 20 MG tablet Take 1 tablet (20 mg total) by mouth 2 (two) times daily. 60 tablet 1   meclizine  (ANTIVERT ) 12.5 MG tablet Take 1-2 tablets (12.5-25 mg total) by mouth 3 (three) times daily as needed for dizziness. 30 tablet 0   Multiple Vitamin (MULTIVITAMIN) tablet Take 1 tablet by mouth daily. (Patient not taking: Reported on 02/09/2024)     pantoprazole  (PROTONIX ) 40 MG  tablet Take 1 tablet (40 mg total) by mouth daily. Take 30 minutes prior to a meal (Patient not taking: Reported on 02/09/2024) 90 tablet 3   promethazine -dextromethorphan (PROMETHAZINE -DM) 6.25-15 MG/5ML syrup Take 5 mLs by mouth 4 (four) times daily as needed. 150 mL 0   simvastatin  (ZOCOR ) 20 MG tablet TAKE 1 TABLET BY MOUTH EVERY DAY IN THE EVENING 90 tablet 3   traZODone  (DESYREL ) 50 MG tablet TAKE 1 TABLET BY MOUTH AT BEDTIME AS NEEDED FOR SLEEP. 90 tablet 2   No current facility-administered medications for this visit.    REVIEW OF SYSTEMS:   All other systems are reviewed and are negative for acute change except as noted in the HPI.  PHYSICAL EXAMINATION: ECOG PERFORMANCE STATUS: 1 - Symptomatic but completely ambulatory  Vitals:   02/27/24 1233  BP: (!) 133/96  Pulse: 100  Resp: 18  Temp: 98.6 F (37 C)  SpO2: 99%   Filed Weights   02/27/24 1233  Weight: 245 lb 5 oz (111.3 kg)    Physical Exam Vitals reviewed.  Constitutional:      Appearance: She is not ill-appearing or toxic-appearing.  HENT:     Head: Normocephalic.     Right Ear: External ear normal.     Left Ear: External ear normal.     Nose: Nose normal.     Mouth/Throat:     Mouth: Mucous membranes are moist.     Pharynx: Oropharynx is clear. No oropharyngeal exudate or posterior oropharyngeal erythema.  Eyes:     General: No scleral icterus.    Conjunctiva/sclera: Conjunctivae normal.  Cardiovascular:     Rate and Rhythm: Normal rate and regular rhythm.     Pulses: Normal pulses.     Heart sounds: Normal heart sounds.  Pulmonary:     Effort: Pulmonary effort is normal.     Breath sounds: Normal breath sounds.  Abdominal:     General: There is no distension.  Musculoskeletal:        General: Normal range of motion.     Cervical back: Normal range of motion.  Lymphadenopathy:     Cervical: No cervical adenopathy.     Upper Body:     Right upper body: No axillary  adenopathy.     Left upper  body: No axillary adenopathy.     Lower Body: No right inguinal adenopathy. No left inguinal adenopathy.  Skin:    General: Skin is warm and dry.  Neurological:     Mental Status: She is alert.  Psychiatric:        Mood and Affect: Mood normal.       LABORATORY DATA:  I have reviewed the data as listed    Latest Ref Rng & Units 02/27/2024    2:08 PM 02/09/2024   12:35 PM 05/23/2023    8:54 AM  CBC  WBC 4.0 - 10.5 K/uL 10.0  9.6  6.5   Hemoglobin 12.0 - 15.0 g/dL 85.0  84.2  85.1   Hematocrit 36.0 - 46.0 % 44.1  47.1  44.1   Platelets 150 - 400 K/uL 350  422.0  332.0        Latest Ref Rng & Units 02/27/2024    2:08 PM 02/09/2024   12:35 PM 05/23/2023    8:54 AM  CMP  Glucose 70 - 99 mg/dL 96  89  93   BUN 8 - 23 mg/dL 19  18  16    Creatinine 0.44 - 1.00 mg/dL 9.16  9.17  8.98   Sodium 135 - 145 mmol/L 139  139  139   Potassium 3.5 - 5.1 mmol/L 4.1  4.1  4.9   Chloride 98 - 111 mmol/L 104  100  103   CO2 22 - 32 mmol/L 28  29  28    Calcium 8.9 - 10.3 mg/dL 9.4  9.9  9.4   Total Protein 6.5 - 8.1 g/dL 7.1  7.7  7.4   Total Bilirubin 0.0 - 1.2 mg/dL 0.3  0.5  0.6   Alkaline Phos 38 - 126 U/L 78  83  67   AST 15 - 41 U/L 18  21  20    ALT 0 - 44 U/L 14  18  14       RADIOGRAPHIC STUDIES: I have personally reviewed the radiological images as listed and agreed with the findings in the report. NM PET Image Initial (PI) Skull Base To Thigh Result Date: 03/02/2024 CLINICAL DATA:  Initial treatment strategy for bilateral pulmonary nodules on chest CT. History of breast cancer. EXAM: NUCLEAR MEDICINE PET SKULL BASE TO THIGH TECHNIQUE: 12.22 mCi F-18 FDG was injected intravenously. Full-ring PET imaging was performed from the skull base to thigh after the radiotracer. CT data was obtained and used for attenuation correction and anatomic localization. Fasting blood glucose: 99 mg/dl COMPARISON:  Chest CT 91/91/7974. Radiographs 02/09/2024. No other comparison CT. FINDINGS: Mediastinal  blood pool activity: SUV max 3.4 NECK: No hypermetabolic cervical lymph nodes are identified. No suspicious activity identified within the pharyngeal mucosal space. Incidental CT findings: none CHEST: There are no hypermetabolic mediastinal, hilar, internal mammary or axillary lymph nodes. No hypermetabolic pulmonary activity or suspicious nodularity. Incidental CT findings: Mild atherosclerosis of the aorta, great vessels and coronary arteries. Postsurgical changes from bilateral breast reconstruction and left axillary node dissection. Unchanged scattered tiny pulmonary nodules bilaterally, measuring up to 3 mm in diameter, much too small to evaluate by PET-CT. ABDOMEN/PELVIS: There is no hypermetabolic activity within the liver, adrenal glands, spleen or pancreas. There is no hypermetabolic nodal activity in the abdomen or pelvis. Incidental CT findings: Mild aortoiliac atherosclerosis. Tiny retroperitoneal lymph nodes are stable, without hypermetabolic activity. There are diverticular changes within the distal colon without evidence of acute inflammation. Status  post hysterectomy. SKELETON: There is no hypermetabolic activity to suggest osseous metastatic disease. Incidental CT findings: Mild spondylosis. IMPRESSION: 1. No evidence of metastatic breast cancer. 2. Stable scattered tiny pulmonary nodules bilaterally, much too small to evaluate by PET-CT. The stability of these nodules can be determined on follow-up chest CT as clinically warranted in this patient with a history of breast cancer. 3.  Aortic Atherosclerosis (ICD10-I70.0). Electronically Signed   By: Elsie Perone M.D.   On: 03/02/2024 09:46   CT CHEST WO CONTRAST Result Date: 02/20/2024 CLINICAL DATA:  Follow-up lung nodules, history of left breast cancer EXAM: CT CHEST WITHOUT CONTRAST TECHNIQUE: Multidetector CT imaging of the chest was performed following the standard protocol without IV contrast. RADIATION DOSE REDUCTION: This exam was  performed according to the departmental dose-optimization program which includes automated exposure control, adjustment of the mA and/or kV according to patient size and/or use of iterative reconstruction technique. COMPARISON:  Chest radiograph February 09, 2024 FINDINGS: Cardiovascular: Atherosclerotic calcifications of coronary arteries. Normal heart size. No pericardial effusion. Mediastinum/Nodes: No enlarged mediastinal or axillary lymph nodes. Right thyroid  lobe nodule. Hiatal hernia and patulous esophagus. Lungs/Pleura: Multiple scattered pulmonary nodules are identified measuring up to 3 mm for example in right upper lobe (8/35) right lower lobe 8/47, right middle lobe 8/78, left upper lobe 8/22. No suspicious finding to correlate with previously seen nodular opacity on prior chest radiograph in right left lower lobe. No consolidation, significant architectural distortion. Mild interlobular septal thickening in bilateral lung base. Mild Peri osteophyte fibrosis along the posteromedial right lower lobe (8/73). No pleural effusion. Upper Abdomen: Few prominent lymph nodes are identified up to 1 cm inferior to the pancreatic body. Musculoskeletal/breast: No chest wall mass or suspicious bone lesions identified. Bilateral mastectomy in breast implants. Surgical clips in left axilla and subpectoral. No suspicious axillary lymphadenopathy. IMPRESSION: Bilateral mastectomy and left axillary nodal dissection without suspicious finding to suggest locally recurrent malignancy or nodal disease. Scattered pulmonary micro nodules, indeterminate. Metastasis cannot be excluded. Recommend follow-up according to oncology protocols as Fleischner guidelines cannot be utilized in patients with known malignancy. Few prominent lymph nodes in upper abdomen inferior to the pancreatic body indeterminate, reactive versus metastatic. Correlate with dedicated abdominal imaging. Atherosclerotic calcifications of coronary arteries.  Electronically Signed   By: Megan  Zare M.D.   On: 02/20/2024 15:23   DG Chest 2 View Result Date: 02/14/2024 CLINICAL DATA:  Cough EXAM: CHEST - 2 VIEW COMPARISON:  Chest radiograph 12/30/2023 FINDINGS: Stable cardiac and mediastinal contours. Persistent surgical clips projecting over the left upper quadrant. Similar-appearing heterogeneous opacities right lung base. Additionally there is a nodular area of consolidation within the left lung base measuring up to 1.9 cm. No pleural effusion or pneumothorax. Thoracic spine degenerative changes. IMPRESSION: 1. Similar-appearing heterogeneous opacities right lung base. 2. Nodular area of consolidation left lung base measuring up to 1.9 cm. Recommend further evaluation with chest CT. 3. These results will be called to the ordering clinician or representative by the Radiologist Assistant, and communication documented in the PACS or [employer]. Electronically Signed   By: Bard Moats M.D.   On: 02/14/2024 13:25    ASSESSMENT & PLAN TIERA MENSINGER is a 78 y.o. female presenting [facility] for consultation regarding pulmonary nodule and abdominal lymphadenopathy. Patient will proceed with laboratory workup today.   #Pulmonary nodules and abdominal lymphadenopathy - Differentials for lymphadenopathy include infectious process, inflammatory process, lymphoproliferative disorder or metastatic disease. - Labs today to check  CBC, CMP, LDH, flow cytometry, ESR and CRP levels, Hepatitis B and C serologies, HIV.  - Patient is scheduled with pulmonology on 04/07/24. Will order PET to further evaluate pulmonary nodules.  #GERD - Patient symptomatic from this and failed treatment with Protonix .  - Discussed trying Pepcid  with patient and she agrees. Prescription sent to pharmacy for month supply to trail for symptom management.  #Age related screenings - UTD based on age  -Patient will RTC when work up is complete.  Patient expressed  understanding of the recommended workup and is agreeable to move forward.   All questions were answered. The patient knows to call the clinic with any problems, questions or concerns.  Shared visit with Dr. Federico.  Orders Placed This Encounter  Procedures   NM PET Image Initial (PI) Skull Base To Thigh    Standing Status:   Future    Number of Occurrences:   1    Expected Date:   02/27/2024    Expiration Date:   02/25/2025    If indicated for the ordered procedure, I authorize the administration of a radiopharmaceutical per Radiology protocol:   Yes    Preferred imaging location?:   [facility]   CBC with Differential (Cancer Center Only)   CMP (Cancer Center only)   C-reactive protein   Flow Cytometry, Peripheral Blood (Oncology)   Hepatitis B core antibody, total   Hepatitis B surface antibody,qualitative   Hepatitis B surface antigen   Hepatitis C antibody   HIV Antibody (routine testing w rflx)   Lactate dehydrogenase   Sedimentation rate      I have spent a total of 60 minutes minutes of face-to-face and non-face-to-face time, preparing to see the patient, obtaining and/or reviewing separately obtained history, performing a medically appropriate examination, counseling and educating the patient, ordering medications/tests/procedures, referring and communicating with other health care professionals, documenting clinical information in the electronic health record, independently interpreting results and communicating results to the patient, and care coordination.   Maclaine Ahola Walisiewicz PA-C Department of Hematology/Oncology [facility] at [facility] Phone: [phone]  I have read the above note and personally examined the patient. I agree with the assessment and plan as noted above.  Briefly Mrs. Cortlyn Cannell is an 78 year old female who presents for evaluation of numerous pulmonary nodules in the setting of a remote history of breast cancer.  At this  time I do not suspect this is a recurrence of her breast cancer given that her diagnosis and treatment was in the 1980s.  A recurrence 40 years later is considerably less likely.  At this time I would recommend a PET CT scan to her further evaluate these nodules.  Additionally the patient has an upcoming visit with pulmonary service.  I would recommend continued follow-up in their nodule clinic assuming there is no clear evidence of a primary disease or metastatic spread to the lung.  The patient voiced understanding of our findings and recommendations moving forward.   Norleen IVAR Federico, MD Department of Hematology/Oncology [facility] at [facility] Phone: [phone] Pager: [phone] Email: [email or web address]

## 2024-02-27 ENCOUNTER — Inpatient Hospital Stay: Attending: Physician Assistant | Admitting: Physician Assistant

## 2024-02-27 ENCOUNTER — Encounter: Payer: Self-pay | Admitting: Medical Oncology

## 2024-02-27 ENCOUNTER — Inpatient Hospital Stay

## 2024-02-27 VITALS — BP 133/96 | HR 100 | Temp 98.6°F | Resp 18 | Wt 245.3 lb

## 2024-02-27 DIAGNOSIS — Z853 Personal history of malignant neoplasm of breast: Secondary | ICD-10-CM | POA: Insufficient documentation

## 2024-02-27 DIAGNOSIS — R918 Other nonspecific abnormal finding of lung field: Secondary | ICD-10-CM | POA: Insufficient documentation

## 2024-02-27 DIAGNOSIS — Z9013 Acquired absence of bilateral breasts and nipples: Secondary | ICD-10-CM | POA: Diagnosis not present

## 2024-02-27 DIAGNOSIS — K219 Gastro-esophageal reflux disease without esophagitis: Secondary | ICD-10-CM | POA: Insufficient documentation

## 2024-02-27 DIAGNOSIS — Z85828 Personal history of other malignant neoplasm of skin: Secondary | ICD-10-CM | POA: Diagnosis not present

## 2024-02-27 DIAGNOSIS — M8580 Other specified disorders of bone density and structure, unspecified site: Secondary | ICD-10-CM | POA: Insufficient documentation

## 2024-02-27 DIAGNOSIS — R590 Localized enlarged lymph nodes: Secondary | ICD-10-CM | POA: Insufficient documentation

## 2024-02-27 DIAGNOSIS — G250 Essential tremor: Secondary | ICD-10-CM | POA: Insufficient documentation

## 2024-02-27 DIAGNOSIS — E785 Hyperlipidemia, unspecified: Secondary | ICD-10-CM | POA: Diagnosis not present

## 2024-02-27 LAB — CBC WITH DIFFERENTIAL (CANCER CENTER ONLY)
Abs Immature Granulocytes: 0.02 K/uL (ref 0.00–0.07)
Basophils Absolute: 0.1 K/uL (ref 0.0–0.1)
Basophils Relative: 1 %
Eosinophils Absolute: 0.2 K/uL (ref 0.0–0.5)
Eosinophils Relative: 2 %
HCT: 44.1 % (ref 36.0–46.0)
Hemoglobin: 14.9 g/dL (ref 12.0–15.0)
Immature Granulocytes: 0 %
Lymphocytes Relative: 24 %
Lymphs Abs: 2.4 K/uL (ref 0.7–4.0)
MCH: 30.1 pg (ref 26.0–34.0)
MCHC: 33.8 g/dL (ref 30.0–36.0)
MCV: 89.1 fL (ref 80.0–100.0)
Monocytes Absolute: 1 K/uL (ref 0.1–1.0)
Monocytes Relative: 10 %
Neutro Abs: 6.3 K/uL (ref 1.7–7.7)
Neutrophils Relative %: 63 %
Platelet Count: 350 K/uL (ref 150–400)
RBC: 4.95 MIL/uL (ref 3.87–5.11)
RDW: 12.8 % (ref 11.5–15.5)
WBC Count: 10 K/uL (ref 4.0–10.5)
nRBC: 0 % (ref 0.0–0.2)

## 2024-02-27 LAB — CMP (CANCER CENTER ONLY)
ALT: 14 U/L (ref 0–44)
AST: 18 U/L (ref 15–41)
Albumin: 4.3 g/dL (ref 3.5–5.0)
Alkaline Phosphatase: 78 U/L (ref 38–126)
Anion gap: 7 (ref 5–15)
BUN: 19 mg/dL (ref 8–23)
CO2: 28 mmol/L (ref 22–32)
Calcium: 9.4 mg/dL (ref 8.9–10.3)
Chloride: 104 mmol/L (ref 98–111)
Creatinine: 0.83 mg/dL (ref 0.44–1.00)
GFR, Estimated: >60 mL/min (ref >60)
Glucose, Bld: 96 mg/dL (ref 70–99)
Potassium: 4.1 mmol/L (ref 3.5–5.1)
Sodium: 139 mmol/L (ref 135–145)
Total Bilirubin: 0.3 mg/dL (ref 0.0–1.2)
Total Protein: 7.1 g/dL (ref 6.5–8.1)

## 2024-02-27 LAB — SEDIMENTATION RATE: Sed Rate: 28 mm/h — ABNORMAL HIGH (ref 0–22)

## 2024-02-27 LAB — HEPATITIS B SURFACE ANTIGEN: Hepatitis B Surface Ag: NONREACTIVE

## 2024-02-27 LAB — LACTATE DEHYDROGENASE: LDH: 125 U/L (ref 98–192)

## 2024-02-27 LAB — HIV ANTIBODY (ROUTINE TESTING W REFLEX): HIV Screen 4th Generation wRfx: NONREACTIVE

## 2024-02-27 LAB — HEPATITIS B SURFACE ANTIBODY,QUALITATIVE: Hep B S Ab: NONREACTIVE

## 2024-02-27 LAB — HEPATITIS C ANTIBODY: HCV Ab: NONREACTIVE

## 2024-02-27 LAB — C-REACTIVE PROTEIN: CRP: 0.7 mg/dL (ref <1.0)

## 2024-02-27 MED ORDER — FAMOTIDINE 20 MG PO TABS
20.0000 mg | ORAL_TABLET | Freq: Two times a day (BID) | ORAL | 1 refills | Status: AC
Start: 2024-02-27 — End: ?

## 2024-02-27 NOTE — Patient Instructions (Addendum)
 Diagnostic Clinic Office Visit Discharge Information and Instructions  Thank you for choosing [facility] for your healthcare needs.  Below is a summary of today's discussion, along with our contact information and an outline of what to expect next.  Reason for Visit:  lung nodules and enlarged abdominal lymph nodes  Proposed Diagnostic Care Plan: Labs collected today. PET scan ordered for further evaluation. Once it is approved by your insurance a scheduler will contact you to schedule the scan. We recommend you keep pulmonology appoint as scheduled for September Prescription for Pepcid  sent to the pharmacy. If this helps your GERD you can ask PCP to prescribe it moving forward.   What to Expect: - Generally, when lab tests are ordered the results can take up to 1 week for results to be available.  At that point, we will contact you to discuss your results with you.  Unless there is a critical result, we will typically wait for all of your lab results to be available before contacting you. - If a biopsy is part of your Care Plan, those results can take on average 7-10 days to result.  Once results are available, we will contact you to discuss your pathology results and any next steps. - If you have additional imaging ordered, such as a CT Scan, MRI, Ultrasound, Bone Scan, or PET scan, your imaging will need to be authorized then scheduled with the earliest available appointment.  You may be asked to travel to another hospital within [facility] who has a sooner availability, please consider doing so if asked. - If you use MyChart, your results will be available to you in the MyChart portal.  Your provider will be in touch with you as soon as all of your results are available to be discussed.  Your Diagnostic Clinic Provider:  Savhanna Sliva Walisiewicz PA-C and Dr. Federico Your Diagnostic Navigator:  Colene Raider RN, office number [phone]  If you or your caregiver have number blocking on  your cell phones, please ensure the cancer center's numbers are not blocked.  If you are not a registered MyChart user, please consider enrolling in MyChart to receive your test results and visit notes.  You can also access your discharge instructions electronically.  MyChart also gives you an electronic means to communicate with your Care Team instead of needing to call in to the cancer center.  We appreciate you trusting us  with your healthcare and look forward to partnering with you as we work to uncover what your potential diagnosis may be.  Please do not hesitate to reach out at any point with questions or concerns.

## 2024-02-27 NOTE — Progress Notes (Signed)
 [facility]  Patient presented to clinic, with spouse and daughter, for her scheduled appointment with DEVONNA Gift. I introduced myself and provided them with my direct contact information. Patient and family were encouraged to call me with any questions/concerns they may have.  Colene KYM Raider, RN, BSN, [facility] Oncology Nurse Navigator, [facility] 02/27/2024 2:21 PM

## 2024-02-28 LAB — HEPATITIS B CORE ANTIBODY, TOTAL: HEP B CORE AB: NEGATIVE

## 2024-03-02 ENCOUNTER — Encounter (HOSPITAL_COMMUNITY)
Admission: RE | Admit: 2024-03-02 | Discharge: 2024-03-02 | Disposition: A | Discharge status: Home / Self Care | Source: Ambulatory Visit | Attending: Physician Assistant | Admitting: Physician Assistant

## 2024-03-02 ENCOUNTER — Telehealth: Payer: Self-pay | Admitting: Physician Assistant

## 2024-03-02 DIAGNOSIS — R590 Localized enlarged lymph nodes: Secondary | ICD-10-CM | POA: Insufficient documentation

## 2024-03-02 DIAGNOSIS — R918 Other nonspecific abnormal finding of lung field: Secondary | ICD-10-CM | POA: Insufficient documentation

## 2024-03-02 LAB — FLOW CYTOMETRY

## 2024-03-02 LAB — GLUCOSE, CAPILLARY: Glucose-Capillary: 99 mg/dL (ref 70–99)

## 2024-03-02 LAB — SURGICAL PATHOLOGY

## 2024-03-02 MED ORDER — FLUDEOXYGLUCOSE F - 18 (FDG) INJECTION
12.2200 | Freq: Once | INTRAVENOUS | Status: AC | PRN
  Administered 2024-03-02: 12.22 via INTRAVENOUS

## 2024-03-02 NOTE — Telephone Encounter (Signed)
 I notified Tanya [name] and her daughter Tanya [name] by phone regarding results from [facility] work up. Labs show patient does not have immunity to Hepatitis B and will need to repeat series with PCP. PET scan showed no evidence of metastatic breast cancer,  no hypermetabolic nodal activity in the abdomen or pelvis, and that the pulmonary nodules are too small to be evaluated via PET. Patient is scheduled to see pulmonologist 04/07/24 to establish care/follow up on pulmonary nodules. Radiologist recommended chest CT to follow up on stability of nodules. No further oncology follow up is needed after discussing results with Dr. Federico. All of patient and daughter's questions were answered and they expressed understanding of the plan provided.

## 2024-03-02 NOTE — Telephone Encounter (Signed)
Attempted x2 without success.

## 2024-03-03 ENCOUNTER — Encounter: Payer: Self-pay | Admitting: Medical Oncology

## 2024-03-03 NOTE — Progress Notes (Signed)
 [facility]) for Malignancy  Hand-off Note  03/03/24 10:19 AM  LARENE ASCENCIO [birth year] [id number]  Cancer Care, Care Team: Norleen IVAR Kidney, MD Mallie Combes, PA-C Colene Raider, [facility] Nurse Navigator  DAISJA KESSINGER was referred to Cancer Care on 02/23/2024 for evaluation of:  pulmonary nodules.  The patient's diagnostic work-up included: Labs: CBC, CMP, Flow Cytometry, Hepatitis B core antibody, Hepatitis B surface antibody, Hepatitis C antibody, HIV antibody, C-reactive protein, Sedimentation rate, Hepatitis B surface antigen, Lactate dehydrogenase 03/02/2024 NM PET Image Initial Skull Base to Thigh   The patient was found to not have malignancy at this time, as evaluated for the reason for referral stated above.  The recommended follow-up provided to the patient includes: follow up with PCP for Hepatitis B series, should patient choose to get the series. Also, see pulmonary, scheduled appointment on 04/07/24 with Dr. Shelah at [facility].  We thank you for allowing us  to assist in Ourania Hamler Khalsa's care.  The initial and most recent Progress Notes, labs, imaging, procedure(s), and/or consult notes have been routed to you through [facility] or faxed to your office for continuity of care.   Colene KYM Raider, RN, BSN, [facility] Nurse Navigator, [facility] 03/03/2024 10:30 AM

## 2024-03-23 ENCOUNTER — Encounter: Payer: Self-pay | Admitting: Internal Medicine

## 2024-04-02 ENCOUNTER — Ambulatory Visit: Payer: Self-pay

## 2024-04-02 NOTE — Telephone Encounter (Signed)
 FYI Only or Action Required?: FYI only for provider.  Patient was last seen in primary care on 02/09/2024 by Merlynn Niki FALCON, FNP.  Called Nurse Triage reporting Dizziness.  Symptoms began About a year ago.  Interventions attempted: Rest, hydration, or home remedies.  Symptoms are: stable.  Triage Disposition: See PCP When Office is Open (Within 3 Days)  Patient/caregiver understands and will follow disposition?: Yes Reason for Disposition  [1] MODERATE dizziness (e.g., vertigo; feels very unsteady, interferes with normal activities) AND [2] has been evaluated by doctor (or NP/PA) for this  Answer Assessment - Initial Assessment Questions Has gone to therapy to get crystals in order, been a while since patient has gotten that done.  1. VERTIGO: Do you feel like either you or the room is spinning or tilting?      Sometimes  2. LIGHTHEADED: Do you feel lightheaded? (e.g., somewhat faint, woozy, weak upon standing)     Yes  3. SEVERITY: How bad is it?  Can you walk?     Yes, moderate  4. ONSET:  When did the dizziness begin?     Over a year, occurs once or twice a week  5. AGGRAVATING FACTORS: Does anything make it worse? (e.g., standing, change in head position)     Unsure  6. RECURRENT SYMPTOM: Have you had dizziness before? If Yes, ask: When was the last time? What happened that time?     Yes  7. OTHER SYMPTOMS: Do you have any other symptoms? (e.g., earache, headache, numbness, tinnitus, vomiting, weakness)     No  Protocols used: Dizziness - Vertigo-A-AH  Copied from CRM #[id number]. Topic: Clinical - Red Word Triage >> Apr 02, 2024 10:14 AM Adelita E wrote: [facility] that prompted transfer to Nurse Triage: Vertigo / dizziness. Patient currently out of medication.

## 2024-04-05 ENCOUNTER — Encounter: Payer: Self-pay | Admitting: Internal Medicine

## 2024-04-05 ENCOUNTER — Other Ambulatory Visit: Payer: Self-pay

## 2024-04-05 ENCOUNTER — Ambulatory Visit: Admitting: Internal Medicine

## 2024-04-05 DIAGNOSIS — R42 Dizziness and giddiness: Secondary | ICD-10-CM

## 2024-04-05 MED ORDER — MECLIZINE HCL 12.5 MG PO TABS: 12.5000 mg | ORAL_TABLET | Freq: Three times a day (TID) | ORAL | 1 refills | Status: DC | PRN

## 2024-04-07 ENCOUNTER — Ambulatory Visit: Admitting: Emergency Medicine

## 2024-04-07 MED ORDER — MECLIZINE HCL 25 MG PO TABS: 25.0000 mg | ORAL_TABLET | Freq: Three times a day (TID) | ORAL | 5 refills | Status: AC | PRN

## 2024-04-11 NOTE — Addendum Note (Signed)
 Addended by: GEOFM GLADE PARAS on: 04/11/2024 03:57 PM   Modules accepted: Orders

## 2024-04-20 ENCOUNTER — Encounter: Payer: Self-pay | Admitting: Physical Therapy

## 2024-04-20 ENCOUNTER — Other Ambulatory Visit: Payer: Self-pay

## 2024-04-20 ENCOUNTER — Ambulatory Visit: Attending: Internal Medicine | Admitting: Physical Therapy

## 2024-04-20 DIAGNOSIS — R2681 Unsteadiness on feet: Secondary | ICD-10-CM | POA: Insufficient documentation

## 2024-04-20 DIAGNOSIS — H8112 Benign paroxysmal vertigo, left ear: Secondary | ICD-10-CM | POA: Diagnosis present

## 2024-04-20 DIAGNOSIS — R42 Dizziness and giddiness: Secondary | ICD-10-CM | POA: Insufficient documentation

## 2024-04-20 NOTE — Therapy (Signed)
 OUTPATIENT PHYSICAL THERAPY VESTIBULAR EVALUATION     Patient Name: Tanya [name] MRN: [id number] DOB:05-Apr-1946, 78 y.o., female Today's Date: 04/20/2024  END OF SESSION:  PT End of Session - 04/20/24 1326     Visit Number 1    Number of Visits 13    Date for Recertification  06/04/24    Authorization Type [employer] Medicare    Progress Note Due on Visit 10    PT Start Time 0904   pt arrives late   PT Stop Time 0934    PT Time Calculation (min) 30 min    Activity Tolerance Patient tolerated treatment well    Behavior During Therapy [facility] for tasks assessed/performed          Past Medical History:  Diagnosis Date   Arthritis    Bursitis 2014   Left; Dr Hiram   Cancer [facility]) 1986   breast cancer L   Hyperlipidemia    Lymphedema 2000   LUE   perennial allergies    PONV (postoperative nausea and vomiting)    Skin cancer    basil cell   Vertigo    Past Surgical History:  Procedure Laterality Date   ABDOMINAL HYSTERECTOMY  1990   & BSO for fibroids   ANKLE FRACTURE SURGERY  2006   BREAST RECONSTRUCTION  1986   BREAST SURGERY  1986   double mastectonmy fror cancer on L   CATARACT EXTRACTION Bilateral    COLONOSCOPY  04/14/2006   Tics ; Dr Luis   KNEE ARTHROSCOPY  2001 & 2011   L & R   THYROID  SURGERY     nodule on thyroid    TOTAL KNEE ARTHROPLASTY  02/24/2012   Procedure: TOTAL KNEE ARTHROPLASTY;  Surgeon: Dempsey LULLA Moan, MD;  Location: [facility];  Service: Orthopedics;  Laterality: Right;   TOTAL KNEE ARTHROPLASTY Left 02/01/2019   Procedure: TOTAL KNEE ARTHROPLASTY;  Surgeon: Moan Dempsey, MD;  Location: [facility];  Service: Orthopedics;  Laterality: Left;    WISDOM TOOTH EXTRACTION     Patient Active Problem List   Diagnosis Date Noted   LRTI (lower respiratory tract infection) 11/17/2023   Memory difficulties 11/17/2023   Central adiposity 09/09/2022   Pre-diabetes 06/17/2022   Vitamin A deficiency 03/28/2022   Other fatigue 03/14/2022   SOBOE  (shortness of breath on exertion) 03/14/2022   Morbid obesity (HCC) 11/16/2021   Ganglion cyst of finger of left hand 10/20/2020   Anxiety and depression 06/19/2020   Hypertensive retinopathy 06/15/2020   Insomnia 12/17/2019   Vertigo 02/22/2019   Pain in left knee 01/25/2019   Dysphagia 09/15/2018   Subacute cough 05/17/2017   Herpes zoster without complication 04/21/2017   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/29/2015   GERD (gastroesophageal reflux disease) 12/14/2013   Essential tremor 12/14/2013   Osteopenia 06/11/2013   OA (osteoarthritis) of knee 02/24/2012   NONTOXIC MULTINODULAR GOITER 03/03/2008   Hyperlipidemia 12/02/2007   Allergic rhinitis 12/02/2007   Personal history of malignant neoplasm of breast 12/02/2007   History of skin cancer 12/02/2007   Vitamin D  deficiency 12/02/2007    PCP: Geofm Glade PARAS, MD  REFERRING PROVIDER: Geofm Glade PARAS, MD   REFERRING DIAG: R42 (ICD-10-CM) - Vertigo   THERAPY DIAG:  BPPV (benign paroxysmal positional vertigo), left  Dizziness and giddiness  Unsteadiness on feet  ONSET DATE: 04/11/2024  Rationale for Evaluation and Treatment: Rehabilitation  SUBJECTIVE:   SUBJECTIVE STATEMENT: Dizziness been going on for a couple of months.  When I lie in  bed, my head goes around and around.  I'll close my eyes and get my bearings and I'll lie until it passes.  Have been afraid to work in my flower bed, because I don't want to fall.  Have been mostly sitting around in recliner in the past few months. Pt accompanied by: self  PERTINENT HISTORY: GERD, nontoxic multinodular goiter, essential tremor, herpes zoster, osteopenia, hyperlipidemia, skin cancer, breast cancer s/p bilateral mastectomy 1986, hypertensive retinopathy.   PAIN:  Are you having pain? No  PRECAUTIONS: None  RED FLAGS: None   WEIGHT BEARING RESTRICTIONS: No  FALLS: Has patient fallen in last 6 months? No  LIVING ENVIRONMENT: Lives with: lives alone Lives in:  House/apartment  PLOF: Independent; enjoys gardening but has mostly been indoors and sitting in recliner for past several months  PATIENT GOALS: To get rid of dizziness  OBJECTIVE:  Note: Objective measures were completed at Evaluation unless otherwise noted.  DIAGNOSTIC FINDINGS: NA for this episode  COGNITION: Overall cognitive status: Within functional limits for tasks assessed   POSTURE:  rounded shoulders and forward head  Cervical ROM:  AROM WFL  BED MOBILITY:  Independent, slowed and guarded  TRANSFERS: Assistive device utilized: BUE support  Sit to stand: Modified independence Stand to sit: Modified independence  GAIT: Gait pattern: step through pattern, decreased step length- Right, decreased step length- Left, trendelenburg, and wide BOS Distance walked: 50 ft Assistive device utilized: None Level of assistance: Modified independence Comments: Gait not assessed, but pt does appear somewhat unsteady  PATIENT SURVEYS:  DHI: THE DIZZINESS HANDICAP INVENTORY (DHI)  P1. Does looking up increase your problem? 4 = Yes  E2. Because of your problem, do you feel frustrated? 4 = Yes  F3. Because of your problem, do you restrict your travel for business or recreation?  4 = Yes  P4. Does walking down the aisle of a supermarket increase your problems?  4 = Yes  F5. Because of your problem, do you have difficulty getting into or out of bed?  2 = Sometimes  F6. Does your problem significantly restrict your participation in social activities, such as going out to dinner, going to the movies, dancing, or going to parties? 4 = Yes  F7. Because of your problem, do you have difficulty reading?  4 = Yes  P8. Does performing more ambitious activities such as sports, dancing, household chores (sweeping or putting dishes away) increase your problems?  4 = Yes  E9. Because of your problem, are you afraid to leave your home without having without having someone accompany you?  0 = No   E10. Because of your problem have you been embarrassed in front of others?  4 = Yes  P11. Do quick movements of your head increase your problem?  4 = Yes  F12. Because of your problem, do you avoid heights?  4 = Yes  P13. Does turning over in bed increase your problem?  2 = Sometimes  F14. Because of your problem, is it difficult for you to do strenuous homework or yard work? 4 = Yes  E15. Because of your problem, are you afraid people may think you are intoxicated? 0 = No  F16. Because of your problem, is it difficult for you to go for a walk by yourself?  2 = Sometimes  P17. Does walking down a sidewalk increase your problem?  4 = Yes  E18.Because of your problem, is it difficult for you to concentrate 2 = Sometimes  F19. Because  of your problem, is it difficult for you to walk around your house in the dark? 2 = Sometimes  E20. Because of your problem, are you afraid to stay home alone?  0 = No  E21. Because of your problem, do you feel handicapped? 0 = No  E22. Has the problem placed stress on your relationships with members of your family or friends? 0 = No  E23. Because of your problem, are you depressed?  2 = Sometimes  F24. Does your problem interfere with your job or household responsibilities?  0 = No  P25. Does bending over increase your problem?  2 = Sometimes  TOTAL 62    DHI Scoring Instructions  The patient is asked to answer each question as it pertains to dizziness or unsteadiness problems, specifically  considering their condition during the last month. Questions are designed to incorporate functional (F), physical  (P), and emotional (E) impacts on disability.   Scores greater than 10 points should be referred to balance specialists for further evaluation.   16-34 Points (mild handicap)  36-52 Points (moderate handicap)  54+ Points (severe handicap)  Minimally Detectable Change: 17 points ([address] [place], 1990)  [place], G. SHAUNNA. and [place], C. W. (1990). The  development of the Dizziness Handicap Inventory. Archives of Otolaryngology - Head and Neck Surgery 116(4): [id number].   VESTIBULAR ASSESSMENT:  GENERAL OBSERVATION: No acute distress   SYMPTOM BEHAVIOR:  Subjective history: Dizziness been going on for a couple of months.  When I lie in bed, my head goes around and around.  I'll close my eyes and get my bearings and I'll lie until it passes.Did have course of antibiotics due to pneumonia.  Feels worse on R side.  Non-Vestibular symptoms: N/a, just dizzy  Type of dizziness: Spinning/Vertigo and pulling to R sometimes  Frequency: daily  Duration: seconds to minutes  Aggravating factors: Induced by position change: lying supine, rolling to the right, rolling to the left, and supine to sit and Induced by motion: looking up at the ceiling and bending down to the ground  Relieving factors: head stationary, closing eyes, and slow movements  Progression of symptoms: unchanged  OCULOMOTOR EXAM:Reports tiny bit of unsteadiness with this exam  Ocular Alignment: normal  Ocular ROM: No Limitations  Spontaneous Nystagmus: absent  Gaze-Induced Nystagmus: absent  Smooth Pursuits: intact  Saccades: intact  Convergence/Divergence: does not become double, but eyes converge   Cover-cross-cover test: NT  VESTIBULAR - OCULAR REFLEX:   Slow VOR: Normal  VOR Cancellation: Comment: slowed and feels scrambled  Head-Impulse Test: HIT Right: seems fuzzy, not overtly positive HIT Left: seems fuzzy, not overtly positive  Dynamic Visual Acuity: NT   POSITIONAL TESTING: Right Dix-Hallpike: No nystagmus, no dizziness Left Dix-Hallpike: Closes eyes; upon opening, extra eye movements and feels dizzy, passes in about 15 seconds Right Roll Test: no nystagmus Left Roll Test: no nystagmus  MOTION SENSITIVITY:  NOT TESTED at EVAL  Motion Sensitivity Quotient Intensity: 0 = none, 1 = Lightheaded, 2 = Mild, 3 = Moderate, 4 = Severe, 5 = Vomiting  Intensity  1.  Sitting to supine   2. Supine to L side   3. Supine to R side   4. Supine to sitting   5. L Hallpike-Dix   6. Up from L    7. R Hallpike-Dix   8. Up from R    9. Sitting, head tipped to L knee   10. Head up from L knee   11. Sitting,  head tipped to R knee   12. Head up from R knee   13. Sitting head turns x5   14.Sitting head nods x5   15. In stance, 180 turn to L    16. In stance, 180 turn to R     OTHOSTATICS: not done  FUNCTIONAL GAIT: NT due to time contstraints                                                                                                                             TREATMENT DATE: 04/20/2024   Canalith Repositioning:  Epley Left: Number of Reps: 1, Response to Treatment: symptoms improved, and Comment: feels better upon sitting; when going into L DH, pt does have 10-15 sec of L upbeating rotary nystagmus, so proceeded to Epley; good tolerance  PATIENT EDUCATION: Education details: Eval results, POC, rationale for BPPV treatment, post- canalith repositioning maneuver instructions Person educated: Patient Education method: Explanation Education comprehension: verbalized understanding  HOME EXERCISE PROGRAM:  GOALS: Goals reviewed with patient? Yes  SHORT TERM GOALS: Target date: 05/07/2024  Pt will be independent with HEP for improved dizziness and balance. Baseline: Goal status: INITIAL  LONG TERM GOALS: Target date: 06/04/2024  Pt will be independent with HEP for improved dizziness and balance. Baseline:  Goal status: INITIAL  2.  Pt will report 0/10 dizziness with bed mobility. Baseline:  Goal status: INITIAL  3.  DHI score to improve to at least 30 for improved overall dizziness. Baseline: 62 Goal status: INITIAL  4.  DGI score to improve to at least 20/24 for decreased fall risk. Baseline: TBD Goal status: INITIAL  5.  Pt will perform MCTSIB in all 4 conditions with mod>min sway for improved balance. Baseline: TBD Goal  status: INITIAL  ASSESSMENT:  CLINICAL IMPRESSION: Patient is a 78 y.o. female who was seen today for physical therapy evaluation and treatment for vertigo.  She reports having 2-3 months of spinning sensation with lying down or getting up from bed, which last seconds>minutes.  With oculomotor testing, these tests are [facility], but pt does report some imbalance sensations.  VOR and VOR cancellation are slowed, and bring on mild symptoms.  With positional testing, pt initially closes eyes when going into L [facility], and when they open, she has extraocular movement and dizziness/ she is dizzy upon return to sit.  No other positions generate nystagmus or dizziness.  When going into L [employer] second time in preparation for L Epley, she does have L upbeating rotary nystagmus lasting 10-15 seconds, indicative of L posterior canal BPPV.  Treated with L Epley, and she tolerates well.  She rates on DHI test 62/100 for dizziness interfering with daily activities.  She appears somewhat unsteady with gait in and out of session, but due to patient arriving late for eval, we were unable to assess balance today; it will warrant further assessment.   OBJECTIVE IMPAIRMENTS: Abnormal gait, decreased balance, decreased mobility, dizziness, and postural  dysfunction.   ACTIVITY LIMITATIONS: bending, standing, transfers, bed mobility, reach over head, and locomotion level  PARTICIPATION LIMITATIONS: meal prep, cleaning, laundry, shopping, community activity, and yard work  PERSONAL FACTORS: 3+ comorbidities: See above for PMH are also affecting patient's functional outcome.   REHAB POTENTIAL: Good  CLINICAL DECISION MAKING: Stable/uncomplicated  EVALUATION COMPLEXITY: Low   PLAN:  PT FREQUENCY: 1-2x/week  PT DURATION: 6 weeksplus eval visit  PLANNED INTERVENTIONS: 97750- Physical Performance Testing, 97110-Therapeutic exercises, 97530- Therapeutic activity, [id number]- Neuromuscular re-education, 97535- Self  Care, 02859- Manual therapy, [id number]- Gait training, [phone]- Canalith repositioning, Patient/Family education, Balance training, and Vestibular training  PLAN FOR NEXT SESSION: Reassess L BPPV and treat; assess MCTSIB and DGI.  Habituation and balance exercises   Garnell Phenix W., PT 04/20/2024, 1:28 PM   [facility] [place] [address] [place], [address] [place], [place] [address] Phone # [phone] Fax # [phone]

## 2024-04-25 ENCOUNTER — Encounter: Payer: Self-pay | Admitting: Internal Medicine

## 2024-04-25 NOTE — Patient Instructions (Incomplete)
     B12 injection today    Medications changes include :   start B12 1000 mcg daily - try the liquid, valium  5 mg every 12 hours as needed.  Topical anti-fungal for toenail

## 2024-04-25 NOTE — Progress Notes (Unsigned)
    Subjective:    Patient ID: Tanya [name], female    DOB: [birth year], 78 y.o.   MRN: [id number]      HPI [place] is here for No chief complaint on file.   Dizziness - started a few of months ago.  Feel it when laying in bed - spinning sensation. Sometimes feels a pulling to the right.  Induced by changes in position.  Improved with staying still, closing eyes, slow movements.  She has been less active because of it - afraid of falling.   Initial PT eval 10/7 - L posterior canal BPPV.  symptoms improved with Epley maneuver.         Medications and allergies reviewed with patient and updated if appropriate.  Current Outpatient Medications on File Prior to Visit  Medication Sig Dispense Refill   famotidine  (PEPCID ) 20 MG tablet Take 1 tablet (20 mg total) by mouth 2 (two) times daily. 60 tablet 1   meclizine  (ANTIVERT ) 25 MG tablet Take 1 tablet (25 mg total) by mouth 3 (three) times daily as needed for dizziness. 30 tablet 5   Multiple Vitamin (MULTIVITAMIN) tablet Take 1 tablet by mouth daily.     pantoprazole  (PROTONIX ) 40 MG tablet Take 1 tablet (40 mg total) by mouth daily. Take 30 minutes prior to a meal 90 tablet 3   promethazine -dextromethorphan (PROMETHAZINE -DM) 6.25-15 MG/5ML syrup Take 5 mLs by mouth 4 (four) times daily as needed. 150 mL 0   simvastatin  (ZOCOR ) 20 MG tablet TAKE 1 TABLET BY MOUTH EVERY DAY IN THE EVENING 90 tablet 3   traZODone  (DESYREL ) 50 MG tablet TAKE 1 TABLET BY MOUTH AT BEDTIME AS NEEDED FOR SLEEP. 90 tablet 2   No current facility-administered medications on file prior to visit.    Review of Systems     Objective:  There were no vitals filed for this visit. BP Readings from Last 3 Encounters:  02/27/24 (!) 133/96  02/09/24 (!) 138/92  02/02/24 (!) 152/108   Wt Readings from Last 3 Encounters:  02/27/24 245 lb 5 oz (111.3 kg)  02/09/24 246 lb (111.6 kg)  02/02/24 246 lb (111.6 kg)   There is no height or weight on file to calculate  BMI.    Physical Exam         Assessment & Plan:    See Problem List for Assessment and Plan of chronic medical problems.

## 2024-04-26 ENCOUNTER — Ambulatory Visit: Admitting: Internal Medicine

## 2024-04-26 VITALS — BP 138/88 | HR 80 | Temp 98.0°F | Ht 64.0 in | Wt 248.0 lb

## 2024-04-26 DIAGNOSIS — E538 Deficiency of other specified B group vitamins: Secondary | ICD-10-CM

## 2024-04-26 DIAGNOSIS — B351 Tinea unguium: Secondary | ICD-10-CM

## 2024-04-26 DIAGNOSIS — R42 Dizziness and giddiness: Secondary | ICD-10-CM

## 2024-04-26 MED ORDER — DIAZEPAM 5 MG PO TABS: 5.0000 mg | ORAL_TABLET | Freq: Two times a day (BID) | ORAL | 1 refills | Status: AC | PRN

## 2024-04-26 MED ORDER — CICLOPIROX 8 % EX SOLN
Freq: Every day | CUTANEOUS | 2 refills | Status: AC
Start: 2024-04-26 — End: ?

## 2024-04-26 MED ORDER — CYANOCOBALAMIN 1000 MCG/ML IJ SOLN
1000.0000 ug | Freq: Once | INTRAMUSCULAR | Status: AC
  Administered 2024-04-26: 1000 ug via INTRAMUSCULAR

## 2024-04-26 NOTE — Assessment & Plan Note (Signed)
 Chronic Not taking B12 currently -stressed she needed to restart that B12 injection today

## 2024-04-26 NOTE — Assessment & Plan Note (Signed)
 Subacute Started a few months ago Has been to PT - consistent with BPPV Continue PT - this will help  Meclizine  not helping Will try valium  5 mg Q12 hr prn - discussed possible SE of drowsiness

## 2024-04-26 NOTE — Assessment & Plan Note (Signed)
 Has had symptoms for a long time - nail is yellow, thickened - appears to be a fungal infection Start ciclopirox 8% daily x 3 months

## 2024-04-30 ENCOUNTER — Ambulatory Visit: Admitting: Physician Assistant

## 2024-04-30 VITALS — BP 124/82 | HR 88 | Wt 245.0 lb

## 2024-04-30 DIAGNOSIS — R413 Other amnesia: Secondary | ICD-10-CM

## 2024-04-30 MED ORDER — DONEPEZIL HCL 5 MG PO TABS: 5.0000 mg | ORAL_TABLET | Freq: Every day | ORAL | 3 refills | Status: AC

## 2024-04-30 NOTE — Patient Instructions (Addendum)
 It was a pleasure to see you today at our office.   Recommendations:     Follow up in 6 months   Start Donepezil 5mg  daily. Side effects discussed     [email or web address]   RECOMMENDATIONS FOR ALL PATIENTS WITH MEMORY PROBLEMS: 1. Continue to exercise (Recommend 30 minutes of walking everyday, or 3 hours every week) 2. Increase social interactions - continue going to [place] and enjoy social gatherings with friends and family 3. Eat healthy, avoid fried foods and eat more fruits and vegetables 4. Maintain adequate blood pressure, blood sugar, and blood cholesterol level. Reducing the risk of stroke and cardiovascular disease also helps promoting better memory. 5. Avoid stressful situations. Live a simple life and avoid aggravations. Organize your time and prepare for the next day in anticipation. 6. Sleep well, avoid any interruptions of sleep and avoid any distractions in the bedroom that may interfere with adequate sleep quality 7. Avoid sugar, avoid sweets as there is a strong link between excessive sugar intake, diabetes, and cognitive impairment We discussed the Mediterranean diet, which has been shown to help patients reduce the risk of progressive memory disorders and reduces cardiovascular risk. This includes eating fish, eat fruits and green leafy vegetables, nuts like almonds and hazelnuts, walnuts, and also use olive oil. Avoid fast foods and fried foods as much as possible. Avoid sweets and sugar as sugar use has been linked to worsening of memory function.  There is always a concern of gradual progression of memory problems. If this is the case, then we may need to adjust level of care according to patient needs. Support, both to the patient and caregiver, should then be put into place.        DRIVING: Regarding driving, in patients with progressive memory problems, driving will be impaired. We advise to have someone else  do the driving if trouble finding directions or if minor accidents are reported. Independent driving assessment is available to determine safety of driving.   If you are interested in the driving assessment, you can contact the following:  [employer] in [place] [phone]  [facility] [phone]  [facility] [phone]  [facility] [phone] or [phone]   FALL PRECAUTIONS: Be cautious when walking. Scan the area for obstacles that may increase the risk of trips and falls. When getting up in the mornings, sit up at the edge of the bed for a few minutes before getting out of bed. Consider elevating the bed at the head end to avoid drop of blood pressure when getting up. Walk always in a well-lit room (use night lights in the walls). Avoid area rugs or power cords from appliances in the middle of the walkways. Use a walker or a cane if necessary and consider physical therapy for balance exercise. Get your eyesight checked regularly.  FINANCIAL OVERSIGHT: Supervision, especially oversight when making financial decisions or transactions is also recommended.  HOME SAFETY: Consider the safety of the kitchen when operating appliances like stoves, microwave oven, and blender. Consider having supervision and share cooking responsibilities until no longer able to participate in those. Accidents with firearms and other hazards in the house should be identified and addressed as well.   ABILITY TO BE LEFT ALONE: If patient is unable to contact 911 operator, consider using LifeLine, or when the need is there, arrange for someone to stay with patients. Smoking is a fire hazard, consider supervision or cessation. Risk of wandering should be assessed by caregiver and if  detected at any point, supervision and safe proof recommendations should be instituted.  MEDICATION SUPERVISION: Inability to self-administer medication needs to be constantly  addressed. Implement a mechanism to ensure safe administration of the medications.      Mediterranean Diet A Mediterranean diet refers to food and lifestyle choices that are based on the traditions of countries located on the [employer]. This way of eating has been shown to help prevent certain conditions and improve outcomes for people who have chronic diseases, like kidney disease and heart disease. What are tips for following this plan? Lifestyle  Cook and eat meals together with your family, when possible. Drink enough fluid to keep your urine clear or pale yellow. Be physically active every day. This includes: Aerobic exercise like running or swimming. Leisure activities like gardening, walking, or housework. Get 7-8 hours of sleep each night. If recommended by your health care provider, drink red wine in moderation. This means 1 glass a day for nonpregnant women and 2 glasses a day for men. A glass of wine equals 5 oz (150 mL). Reading food labels  Check the serving size of packaged foods. For foods such as rice and pasta, the serving size refers to the amount of cooked product, not dry. Check the total fat in packaged foods. Avoid foods that have saturated fat or trans fats. Check the ingredients list for added sugars, such as corn syrup. Shopping  At the grocery store, buy most of your food from the areas near the walls of the store. This includes: Fresh fruits and vegetables (produce). Grains, beans, nuts, and seeds. Some of these may be available in unpackaged forms or large amounts (in bulk). Fresh seafood. Poultry and eggs. Low-fat dairy products. Buy whole ingredients instead of prepackaged foods. Buy fresh fruits and vegetables in-season from local farmers markets. Buy frozen fruits and vegetables in resealable bags. If you do not have access to quality fresh seafood, buy precooked frozen shrimp or canned fish, such as tuna, salmon, or sardines. Buy small amounts  of raw or cooked vegetables, salads, or olives from the deli or salad bar at your store. Stock your pantry so you always have certain foods on hand, such as olive oil, canned tuna, canned tomatoes, rice, pasta, and beans. Cooking  Cook foods with extra-virgin olive oil instead of using butter or other vegetable oils. Have meat as a side dish, and have vegetables or grains as your main dish. This means having meat in small portions or adding small amounts of meat to foods like pasta or stew. Use beans or vegetables instead of meat in common dishes like chili or lasagna. Experiment with different cooking methods. Try roasting or broiling vegetables instead of steaming or sauteing them. Add frozen vegetables to soups, stews, pasta, or rice. Add nuts or seeds for added healthy fat at each meal. You can add these to yogurt, salads, or vegetable dishes. Marinate fish or vegetables using olive oil, lemon juice, garlic, and fresh herbs. Meal planning  Plan to eat 1 vegetarian meal one day each week. Try to work up to 2 vegetarian meals, if possible. Eat seafood 2 or more times a week. Have healthy snacks readily available, such as: Vegetable sticks with hummus. Greek yogurt. Fruit and nut trail mix. Eat balanced meals throughout the week. This includes: Fruit: 2-3 servings a day Vegetables: 4-5 servings a day Low-fat dairy: 2 servings a day Fish, poultry, or lean meat: 1 serving a day Beans and legumes: 2 or more servings  a week Nuts and seeds: 1-2 servings a day Whole grains: 6-8 servings a day Extra-virgin olive oil: 3-4 servings a day Limit red meat and sweets to only a few servings a month What are my food choices? Mediterranean diet Recommended Grains: Whole-grain pasta. Brown rice. Bulgar wheat. Polenta. Couscous. Whole-wheat bread. [place] Made[place]. Vegetables: Artichokes. Beets. Broccoli. Cabbage. Carrots. Eggplant. Green beans. Chard. Kale. Spinach. Onions. Leeks. Peas. Squash.  Tomatoes. Peppers. Radishes. Fruits: Apples. Apricots. Avocado. Berries. Bananas. Cherries. Dates. Figs. Grapes. Lemons. Melon. Oranges. Peaches. Plums. Pomegranate. Meats and other protein foods: Beans. Almonds. Sunflower seeds. Pine nuts. Peanuts. Cod. Salmon. Scallops. Shrimp. Tuna. Tilapia. Clams. Oysters. Eggs. Dairy: Low-fat milk. Cheese. Greek yogurt. Beverages: Water. Red wine. Herbal tea. Fats and oils: Extra virgin olive oil. Avocado oil. Grape seed oil. Sweets and desserts: [place] yogurt with honey. Baked apples. Poached pears. Trail mix. Seasoning and other foods: Basil. Cilantro. Coriander. Cumin. Mint. Parsley. Sage. Rosemary. Tarragon. Garlic. Oregano. Thyme. Pepper. Balsalmic vinegar. Tahini. Hummus. Tomato sauce. Olives. Mushrooms. Limit these Grains: Prepackaged pasta or rice dishes. Prepackaged cereal with added sugar. Vegetables: Deep fried potatoes (french fries). Fruits: Fruit canned in syrup. Meats and other protein foods: Beef. Pork. Lamb. Poultry with skin. Hot dogs. [place]. Dairy: Ice cream. Sour cream. Whole milk. Beverages: Juice. Sugar-sweetened soft drinks. Beer. Liquor and spirits. Fats and oils: Butter. Canola oil. Vegetable oil. Beef fat (tallow). Lard. Sweets and desserts: Cookies. Cakes. Pies. Candy. Seasoning and other foods: Mayonnaise. Premade sauces and marinades. The items listed may not be a complete list. Talk with your dietitian about what dietary choices are right for you. Summary The Mediterranean diet includes both food and lifestyle choices. Eat a variety of fresh fruits and vegetables, beans, nuts, seeds, and whole grains. Limit the amount of red meat and sweets that you eat. Talk with your health care provider about whether it is safe for you to drink red wine in moderation. This means 1 glass a day for nonpregnant women and 2 glasses a day for men. A glass of wine equals 5 oz (150 mL). This information is not intended to replace advice given to  you by your health care provider. Make sure you discuss any questions you have with your health care provider. Document Released: 02/22/2016 Document Revised: 03/26/2016 Document Reviewed: 02/22/2016 Elsevier Interactive Patient Education  2017 [employer].

## 2024-04-30 NOTE — Progress Notes (Signed)
 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 RH female with a history of hypertension, hyperlipidemia, prediabetes, Vit D deficiency, insomnia, arthritis, BPPV, mild ET seen today in follow up for memory loss. Patient is not on antidementia medication. Discussed starting donepezil 5 mg daily in an effort to slow down cognitive decline, patient agrees. Patient is able to participate on ADLs and to drive without significant difficulties.  Mood is stable    Follow up in  6 months. Start Donepezil 5mg  daily. Side effects discussed , may increase to 10 mg in the near future,  if tolerated Replenish B12 (215) Recommend good control of her cardiovascular risk factors Continue to control mood as per PCP Agree with VT for BPPV     Subjective:    This patient is accompanied in the office by her daughter  who supplements the history.  Previous records as well as any outside records available were reviewed prior to todays visit. Patient was last seen on 12/25/2023 with [facility] 15/30    Any changes in memory since last visit? About the same.  Continues to have difficulties with short-term memory, including new information, recent conversations, names.  Sometimes she has difficulty understanding instructions, such as using the remote control.  LTM is good.  Does not like brain stimulating exercises, she may play some table games.  She likes to watch a lot of TV . repeats oneself?  Endorsed Disoriented when walking into a room? Denies    Leaving objects?  May misplace things such as money I hide them and I cannot find them   Wandering behavior?  denies   Any personality changes since last visit?  Denies.   Any worsening depression?:  Denies.   Hallucinations or paranoia?  Denies.   Seizures? denies    Any sleep changes?  Not really. She reports not sleeping very well, denies vivid  dreams, REM behavior or sleepwalking.    Sleep apnea?   Denies.   Any hygiene concerns? Denies.  Independent of bathing and dressing?  Endorsed  Does the patient needs help with medications?  Patient is in charge   Who is in charge of the finances?  Patient is in charge, daughter monitors     Any changes in appetite?  denies.  She favors sweets.  Not drinking enough water    Patient have trouble swallowing? Denies.   Does the patient cook?  Yes, he denies forgetting, recipes or having kitchen accidents. Any headaches?   denies   Any vision changes?denies  Chronic back pain  denies   Ambulates with difficulty? Denies.  Does not walk frequently  Recent falls or head injuries? Denies.     Unilateral weakness, numbness or tingling? denies   Any tremors?  She has a history of essential tremors without any worsening without any other major parkinsonian signs Any anosmia?  Denies   Any incontinence of urine?Stress incontinence    Any bowel dysfunction?   Denies      Patient lives with her husband and her daughter     Does the patient drive?  Yes, she denies getting lost   MRI of the brain without contrast June 2025 , personally reviewed remarkable for mild cortical atrophy greatest in the bifrontal region and mild chronic small vessel disease, no acute intracranial abnormality   PREVIOUS MEDICATIONS:   CURRENT MEDICATIONS:  Outpatient Encounter Medications as of 04/30/2024  Medication Sig   donepezil (ARICEPT) 5 MG tablet Take 1 tablet (5 mg total) by mouth daily.   ciclopirox (PENLAC) 8 % solution Apply topically at bedtime. Apply over nail and surrounding skin. Apply daily over previous coat. After seven (7) days, may remove with alcohol and continue cycle.   diazepam  (VALIUM ) 5 MG tablet Take 1 tablet (5 mg total) by mouth every 12 (twelve) hours as needed for anxiety.   famotidine  (PEPCID ) 20 MG tablet Take 1 tablet (20 mg total) by mouth 2 (two) times daily.   meclizine  (ANTIVERT ) 25  MG tablet Take 1 tablet (25 mg total) by mouth 3 (three) times daily as needed for dizziness.   Multiple Vitamin (MULTIVITAMIN) tablet Take 1 tablet by mouth daily.   pantoprazole  (PROTONIX ) 40 MG tablet Take 1 tablet (40 mg total) by mouth daily. Take 30 minutes prior to a meal   simvastatin  (ZOCOR ) 20 MG tablet TAKE 1 TABLET BY MOUTH EVERY DAY IN THE EVENING   traZODone  (DESYREL ) 50 MG tablet TAKE 1 TABLET BY MOUTH AT BEDTIME AS NEEDED FOR SLEEP.   No facility-administered encounter medications on file as of 04/30/2024.        No data to display            12/25/2023    9:00 AM  Montreal Cognitive Assessment   Visuospatial/ Executive (0/5) 2  Naming (0/3) 2  Attention: Read list of digits (0/2) 2  Attention: Read list of letters (0/1) 1  Attention: Serial 7 subtraction starting at 100 (0/3) 0  Language: Repeat phrase (0/2) 2  Language : Fluency (0/1) 0  Abstraction (0/2) 0  Delayed Recall (0/5) 0  Orientation (0/6) 6  Total 15  Adjusted Score (based on education) 15    Objective:     PHYSICAL EXAMINATION:    VITALS:   Vitals:   04/30/24 1258  BP: 124/82  Pulse: 88  SpO2: 96%  Weight: 245 lb (111.1 kg)    GEN:  The patient appears stated age and is in NAD. HEENT:  Normocephalic, atraumatic.   Neurological examination:  General: NAD, well-groomed, appears stated age. Orientation: The patient is alert. Oriented to person, place and date Cranial nerves: There is good facial symmetry.The speech is fluent and clear. No aphasia or dysarthria. Fund of knowledge is appropriate. Recent and remote memory are impaired. Attention and concentration are reduced. Able to name objects and repeat phrases.  Hearing is intact to conversational tone.   Sensation: Sensation is intact to light touch throughout Motor: [name] is at least antigravity x4. DTR's 1/4 in UE/LE     Movement examination: Tone: There is normal tone in the UE/LE Abnormal movements:  no tremor.  No  myoclonus.  No asterixis.   Coordination:  There is no decremation with RAM's. Normal finger to nose  Gait and Station: The patient has some difficulty arising out of a deep-seated chair without the use of the hands. The patient's stride length is good.  Arm swing slightly decreased.  Gait is cautious and narrow.    Thank you for allowing us  the opportunity to participate in the care of this nice patient. Please do not hesitate to contact us  for any questions or concerns.   Total time spent on today's visit was 30 minutes dedicated to this patient today, preparing to see patient, examining the patient, ordering tests and/or medications and counseling the patient, documenting clinical information in the EHR or other health record, independently interpreting results and communicating results  to the patient/family, discussing treatment and goals, answering patient's questions and coordinating care.  Cc:  Geofm Glade PARAS, MD  Camie Sevin 04/30/2024 6:29 PM

## 2024-05-04 ENCOUNTER — Ambulatory Visit: Admitting: Physical Therapy

## 2024-05-06 ENCOUNTER — Encounter: Admitting: Physical Therapy

## 2024-05-10 NOTE — Therapy (Signed)
 OUTPATIENT PHYSICAL THERAPY VESTIBULAR TREATMENT     Patient Name: Tanya [name] MRN: [id number] DOB:[birth year], 78 y.o., female Today's Date: 05/11/2024  END OF SESSION:  PT End of Session - 05/11/24 0850     Visit Number 2    Number of Visits 13    Date for Recertification  06/04/24    Authorization Type [employer]    Progress Note Due on Visit 10    PT Start Time 0800    PT Stop Time 0849    PT Time Calculation (min) 49 min    Equipment Utilized During Treatment Gait belt    Activity Tolerance Patient tolerated treatment well    Behavior During Therapy WFL for tasks assessed/performed           Past Medical History:  Diagnosis Date   Arthritis    Bursitis 2014   Left; Dr Hiram   Cancer [facility]) 1986   breast cancer L   Hyperlipidemia    Lymphedema 2000   LUE   perennial allergies    PONV (postoperative nausea and vomiting)    Skin cancer    basil cell   Vertigo    Past Surgical History:  Procedure Laterality Date   ABDOMINAL HYSTERECTOMY  1990   & BSO for fibroids   ANKLE FRACTURE SURGERY  2006   BREAST RECONSTRUCTION  1986   BREAST SURGERY  1986   double mastectonmy fror cancer on L   CATARACT EXTRACTION Bilateral    COLONOSCOPY  04/14/2006   Tics ; Dr Luis   KNEE ARTHROSCOPY  2001 & 2011   L & R   THYROID  SURGERY     nodule on thyroid    TOTAL KNEE ARTHROPLASTY  02/24/2012   Procedure: TOTAL KNEE ARTHROPLASTY;  Surgeon: Dempsey LULLA Moan, MD;  Location: [facility];  Service: Orthopedics;  Laterality: Right;   TOTAL KNEE ARTHROPLASTY Left 02/01/2019   Procedure: TOTAL KNEE ARTHROPLASTY;  Surgeon: Moan Dempsey, MD;  Location: [facility];  Service: Orthopedics;  Laterality: Left;    WISDOM TOOTH EXTRACTION     Patient Active Problem List   Diagnosis Date Noted   Vitamin B12 deficiency 04/26/2024   Onychomycosis of left great toe 04/26/2024   LRTI (lower respiratory tract infection) 11/17/2023   Memory difficulties 11/17/2023   Central  adiposity 09/09/2022   Pre-diabetes 06/17/2022   Vitamin A deficiency 03/28/2022   Other fatigue 03/14/2022   SOBOE (shortness of breath on exertion) 03/14/2022   Morbid obesity (HCC) 11/16/2021   Ganglion cyst of finger of left hand 10/20/2020   Anxiety and depression 06/19/2020   Hypertensive retinopathy 06/15/2020   Insomnia 12/17/2019   Vertigo 02/22/2019   Pain in left knee 01/25/2019   Dysphagia 09/15/2018   Subacute cough 05/17/2017   Herpes zoster without complication 04/21/2017   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/29/2015   GERD (gastroesophageal reflux disease) 12/14/2013   Essential tremor 12/14/2013   Osteopenia 06/11/2013   OA (osteoarthritis) of knee 02/24/2012   NONTOXIC MULTINODULAR GOITER 03/03/2008   Hyperlipidemia 12/02/2007   Allergic rhinitis 12/02/2007   Personal history of malignant neoplasm of breast 12/02/2007   History of skin cancer 12/02/2007   Vitamin D  deficiency 12/02/2007    PCP: Geofm Glade PARAS, MD  REFERRING PROVIDER: Geofm Glade PARAS, MD   REFERRING DIAG: R42 (ICD-10-CM) - Vertigo   THERAPY DIAG:  BPPV (benign paroxysmal positional vertigo), left  Dizziness and giddiness  Unsteadiness on feet  ONSET DATE: 04/11/2024  Rationale for Evaluation and Treatment: Rehabilitation  SUBJECTIVE:   SUBJECTIVE STATEMENT: It's okay, I'm still dizzy. It occurs when I'm rolling in bed.   Pt accompanied by: self  PERTINENT HISTORY: GERD, nontoxic multinodular goiter, essential tremor, herpes zoster, osteopenia, hyperlipidemia, skin cancer, breast cancer s/p bilateral mastectomy 1986, hypertensive retinopathy.   PAIN:  Are you having pain? No  PRECAUTIONS: None  RED FLAGS: None   WEIGHT BEARING RESTRICTIONS: No  FALLS: Has patient fallen in last 6 months? No  LIVING ENVIRONMENT: Lives with: lives alone Lives in: House/apartment  PLOF: Independent; enjoys gardening but has mostly been indoors and sitting in recliner for past several  months  PATIENT GOALS: To get rid of dizziness  OBJECTIVE:     TODAY'S TREATMENT: 05/11/24   M-CTSIB  Condition 1: Firm Surface, EO 30 Sec, Mild and Moderate Sway  Condition 2: Firm Surface, EC 30 Sec, Mild and Moderate Sway  Condition 3: Foam Surface, EO 30 Sec, Moderate Sway  Condition 4: Foam Surface, EC 7 Sec, Moderate and Severe Sway pt opened eyes      Activity Comments  DGI 13/24  R loaded DH  R upbeating torsional nystagmus lasting ~25 sec  L loaded DH Larger amplitude L upbeating torsional nystagmus lasting ~20 sec  L epley  Tolerated well; some c/o dizziness in position 3  L DH  Strong L beating nystagmus  R roll Geotropic nystagmus   Gufoni lying to R side first Tolerated well   L DH Large amplitude L upbeating torsional nystagmus lasting ~20 sec     [facility] PT Assessment - 05/11/24 0001       Standardized Balance Assessment   Standardized Balance Assessment Dynamic Gait Index      Dynamic Gait Index   Level Surface Normal    Change in Gait Speed Mild Impairment    Gait with Horizontal Head Turns Moderate Impairment    Gait with Vertical Head Turns Moderate Impairment    Gait and Pivot Turn Moderate Impairment    Step Over Obstacle Moderate Impairment    Step Around Obstacles Mild Impairment    Steps Mild Impairment    Total Score 13          PATIENT EDUCATION: Education details: edu on exam findings and falls risk and advised using SPC for safety; edu on post-CRM expectations  Person educated: Patient Education method: Explanation, Demonstration, Tactile cues, and Verbal cues Education comprehension: verbalized understanding     Note: Objective measures were completed at Evaluation unless otherwise noted.  DIAGNOSTIC FINDINGS: NA for this episode  COGNITION: Overall cognitive status: Within functional limits for tasks assessed   POSTURE:  rounded shoulders and forward head  Cervical ROM:  AROM WFL  BED MOBILITY:  Independent, slowed  and guarded  TRANSFERS: Assistive device utilized: BUE support  Sit to stand: Modified independence Stand to sit: Modified independence  GAIT: Gait pattern: step through pattern, decreased step length- Right, decreased step length- Left, trendelenburg, and wide BOS Distance walked: 50 ft Assistive device utilized: None Level of assistance: Modified independence Comments: Gait not assessed, but pt does appear somewhat unsteady  PATIENT SURVEYS:  DHI: THE DIZZINESS HANDICAP INVENTORY (DHI)  P1. Does looking up increase your problem? 4 = Yes  E2. Because of your problem, do you feel frustrated? 4 = Yes  F3. Because of your problem, do you restrict your travel for business or recreation?  4 = Yes  P4. Does walking down the aisle of a supermarket increase your problems?  4 = Yes  F5.  Because of your problem, do you have difficulty getting into or out of bed?  2 = Sometimes  F6. Does your problem significantly restrict your participation in social activities, such as going out to dinner, going to the movies, dancing, or going to parties? 4 = Yes  F7. Because of your problem, do you have difficulty reading?  4 = Yes  P8. Does performing more ambitious activities such as sports, dancing, household chores (sweeping or putting dishes away) increase your problems?  4 = Yes  E9. Because of your problem, are you afraid to leave your home without having without having someone accompany you?  0 = No  E10. Because of your problem have you been embarrassed in front of others?  4 = Yes  P11. Do quick movements of your head increase your problem?  4 = Yes  F12. Because of your problem, do you avoid heights?  4 = Yes  P13. Does turning over in bed increase your problem?  2 = Sometimes  F14. Because of your problem, is it difficult for you to do strenuous homework or yard work? 4 = Yes  E15. Because of your problem, are you afraid people may think you are intoxicated? 0 = No  F16. Because of your problem,  is it difficult for you to go for a walk by yourself?  2 = Sometimes  P17. Does walking down a sidewalk increase your problem?  4 = Yes  E18.Because of your problem, is it difficult for you to concentrate 2 = Sometimes  F19. Because of your problem, is it difficult for you to walk around your house in the dark? 2 = Sometimes  E20. Because of your problem, are you afraid to stay home alone?  0 = No  E21. Because of your problem, do you feel handicapped? 0 = No  E22. Has the problem placed stress on your relationships with members of your family or friends? 0 = No  E23. Because of your problem, are you depressed?  2 = Sometimes  F24. Does your problem interfere with your job or household responsibilities?  0 = No  P25. Does bending over increase your problem?  2 = Sometimes  TOTAL 62    DHI Scoring Instructions  The patient is asked to answer each question as it pertains to dizziness or unsteadiness problems, specifically  considering their condition during the last month. Questions are designed to incorporate functional (F), physical  (P), and emotional (E) impacts on disability.   Scores greater than 10 points should be referred to balance specialists for further evaluation.   16-34 Points (mild handicap)  36-52 Points (moderate handicap)  54+ Points (severe handicap)  Minimally Detectable Change: 17 points ([address] [place], 1990)  Parole, G. SHAUNNA. and [place], C. W. (1990). The development of the Dizziness Handicap Inventory. Archives of Otolaryngology - Head and Neck Surgery 116(4): [id number].   VESTIBULAR ASSESSMENT:  GENERAL OBSERVATION: No acute distress   SYMPTOM BEHAVIOR:  Subjective history: Dizziness been going on for a couple of months.  When I lie in bed, my head goes around and around.  I'll close my eyes and get my bearings and I'll lie until it passes.Did have course of antibiotics due to pneumonia.  Feels worse on R side.  Non-Vestibular symptoms: N/a, just  dizzy  Type of dizziness: Spinning/Vertigo and pulling to R sometimes  Frequency: daily  Duration: seconds to minutes  Aggravating factors: Induced by position change: lying supine, rolling to the right,  rolling to the left, and supine to sit and Induced by motion: looking up at the ceiling and bending down to the ground  Relieving factors: head stationary, closing eyes, and slow movements  Progression of symptoms: unchanged  OCULOMOTOR EXAM:Reports tiny bit of unsteadiness with this exam  Ocular Alignment: normal  Ocular ROM: No Limitations  Spontaneous Nystagmus: absent  Gaze-Induced Nystagmus: absent  Smooth Pursuits: intact  Saccades: intact  Convergence/Divergence: does not become double, but eyes converge   Cover-cross-cover test: NT  VESTIBULAR - OCULAR REFLEX:   Slow VOR: Normal  VOR Cancellation: Comment: slowed and feels scrambled  Head-Impulse Test: HIT Right: seems fuzzy, not overtly positive HIT Left: seems fuzzy, not overtly positive  Dynamic Visual Acuity: NT   POSITIONAL TESTING: Right Dix-Hallpike: No nystagmus, no dizziness Left Dix-Hallpike: Closes eyes; upon opening, extra eye movements and feels dizzy, passes in about 15 seconds Right Roll Test: no nystagmus Left Roll Test: no nystagmus  MOTION SENSITIVITY:  NOT TESTED at EVAL  Motion Sensitivity Quotient Intensity: 0 = none, 1 = Lightheaded, 2 = Mild, 3 = Moderate, 4 = Severe, 5 = Vomiting  Intensity  1. Sitting to supine   2. Supine to L side   3. Supine to R side   4. Supine to sitting   5. L Hallpike-Dix   6. Up from L    7. R Hallpike-Dix   8. Up from R    9. Sitting, head tipped to L knee   10. Head up from L knee   11. Sitting, head tipped to R knee   12. Head up from R knee   13. Sitting head turns x5   14.Sitting head nods x5   15. In stance, 180 turn to L    16. In stance, 180 turn to R     OTHOSTATICS: not done  FUNCTIONAL GAIT: NT due to time contstraints                                                                                                                              TREATMENT DATE: 04/20/2024   Canalith Repositioning:  Epley Left: Number of Reps: 1, Response to Treatment: symptoms improved, and Comment: feels better upon sitting; when going into L DH, pt does have 10-15 sec of L upbeating rotary nystagmus, so proceeded to Epley; good tolerance  PATIENT EDUCATION: Education details: Eval results, POC, rationale for BPPV treatment, post- canalith repositioning maneuver instructions Person educated: Patient Education method: Explanation Education comprehension: verbalized understanding  HOME EXERCISE PROGRAM:  GOALS: Goals reviewed with patient? Yes  SHORT TERM GOALS: Target date: 05/07/2024  Pt will be independent with HEP for improved dizziness and balance. Baseline: Goal status: IN PROGRESS  LONG TERM GOALS: Target date: 06/04/2024  Pt will be independent with HEP for improved dizziness and balance. Baseline:  Goal status: IN PROGRESS  2.  Pt will report 0/10 dizziness with bed mobility. Baseline:  Goal status: IN  PROGRESS  3.  DHI score to improve to at least 30 for improved overall dizziness. Baseline: 62 Goal status: IN PROGRESS  4.  DGI score to improve to at least 20/24 for decreased fall risk. Baseline: 13/24 Goal status: IN PROGRESS  5.  Pt will perform MCTSIB in all 4 conditions with mod>min sway for improved balance. Baseline: unable complete 30 sec of condition 4  Goal status: IN PROGRESS  ASSESSMENT:  CLINICAL IMPRESSION: Patient arrived to session with report of remaining dizziness when rolling in bed. Multisensory balance testing revealed impairment. Patient scored 13/24 on DGI, indicating an increased risk of falls. Positional testing revealed L>R posterior canalithiasis, treated with L Epley x 1. Recheck revealed suspected canal conversion to L horizontal canalithiasis, treated with Gufoni. Without resolution.  Patient was escorted safely to waiting room at end of session to her ride.   OBJECTIVE IMPAIRMENTS: Abnormal gait, decreased balance, decreased mobility, dizziness, and postural dysfunction.   ACTIVITY LIMITATIONS: bending, standing, transfers, bed mobility, reach over head, and locomotion level  PARTICIPATION LIMITATIONS: meal prep, cleaning, laundry, shopping, community activity, and yard work  PERSONAL FACTORS: 3+ comorbidities: See above for PMH are also affecting patient's functional outcome.   REHAB POTENTIAL: Good  CLINICAL DECISION MAKING: Stable/uncomplicated  EVALUATION COMPLEXITY: Low   PLAN:  PT FREQUENCY: 1-2x/week  PT DURATION: 6 weeksplus eval visit  PLANNED INTERVENTIONS: 97750- Physical Performance Testing, 97110-Therapeutic exercises, 97530- Therapeutic activity, 97112- Neuromuscular re-education, 97535- Self Care, [id number]- Manual therapy, [phone]- Gait training, [phone]- Canalith repositioning, Patient/Family education, Balance training, and Vestibular training  PLAN FOR NEXT SESSION: Reassess L BPPV and treat; Habituation and balance exercises; gait training with SPC   Louana Terrilyn Christians, PT, DPT 05/11/24 8:51 AM  [facility] at [facility] [address] [place], [place] [address] Phone # [phone] Fax # [phone]

## 2024-05-11 ENCOUNTER — Encounter: Payer: Self-pay | Admitting: Physical Therapy

## 2024-05-11 ENCOUNTER — Ambulatory Visit: Admitting: Physical Therapy

## 2024-05-11 DIAGNOSIS — H8112 Benign paroxysmal vertigo, left ear: Secondary | ICD-10-CM

## 2024-05-11 DIAGNOSIS — R42 Dizziness and giddiness: Secondary | ICD-10-CM

## 2024-05-11 DIAGNOSIS — R2681 Unsteadiness on feet: Secondary | ICD-10-CM

## 2024-05-12 NOTE — Therapy (Signed)
 OUTPATIENT PHYSICAL THERAPY VESTIBULAR TREATMENT     Patient Name: Tanya [name] MRN: [id number] DOB:[birth year], 78 y.o., female Today's Date: 05/13/2024  END OF SESSION:  PT End of Session - 05/13/24 0845     Visit Number 3    Number of Visits 13    Date for Recertification  06/04/24    Authorization Type [employer]    Progress Note Due on Visit 10    PT Start Time 0803    PT Stop Time 0844    PT Time Calculation (min) 41 min    Activity Tolerance Patient tolerated treatment well    Behavior During Therapy [facility] for tasks assessed/performed            Past Medical History:  Diagnosis Date   Arthritis    Bursitis 2014   Left; Dr Hiram   Cancer [facility]) 1986   breast cancer L   Hyperlipidemia    Lymphedema 2000   LUE   perennial allergies    PONV (postoperative nausea and vomiting)    Skin cancer    basil cell   Vertigo    Past Surgical History:  Procedure Laterality Date   ABDOMINAL HYSTERECTOMY  1990   & BSO for fibroids   ANKLE FRACTURE SURGERY  2006   BREAST RECONSTRUCTION  1986   BREAST SURGERY  1986   double mastectonmy fror cancer on L   CATARACT EXTRACTION Bilateral    COLONOSCOPY  04/14/2006   Tics ; Dr Luis   KNEE ARTHROSCOPY  2001 & 2011   L & R   THYROID  SURGERY     nodule on thyroid    TOTAL KNEE ARTHROPLASTY  02/24/2012   Procedure: TOTAL KNEE ARTHROPLASTY;  Surgeon: Dempsey LULLA Moan, MD;  Location: [facility];  Service: Orthopedics;  Laterality: Right;   TOTAL KNEE ARTHROPLASTY Left 02/01/2019   Procedure: TOTAL KNEE ARTHROPLASTY;  Surgeon: Moan Dempsey, MD;  Location: [facility];  Service: Orthopedics;  Laterality: Left;    WISDOM TOOTH EXTRACTION     Patient Active Problem List   Diagnosis Date Noted   Vitamin B12 deficiency 04/26/2024   Onychomycosis of left great toe 04/26/2024   LRTI (lower respiratory tract infection) 11/17/2023   Memory difficulties 11/17/2023   Central adiposity 09/09/2022   Pre-diabetes 06/17/2022    Vitamin A deficiency 03/28/2022   Other fatigue 03/14/2022   SOBOE (shortness of breath on exertion) 03/14/2022   Morbid obesity (HCC) 11/16/2021   Ganglion cyst of finger of left hand 10/20/2020   Anxiety and depression 06/19/2020   Hypertensive retinopathy 06/15/2020   Insomnia 12/17/2019   Vertigo 02/22/2019   Pain in left knee 01/25/2019   Dysphagia 09/15/2018   Subacute cough 05/17/2017   Herpes zoster without complication 04/21/2017   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/29/2015   GERD (gastroesophageal reflux disease) 12/14/2013   Essential tremor 12/14/2013   Osteopenia 06/11/2013   OA (osteoarthritis) of knee 02/24/2012   NONTOXIC MULTINODULAR GOITER 03/03/2008   Hyperlipidemia 12/02/2007   Allergic rhinitis 12/02/2007   Personal history of malignant neoplasm of breast 12/02/2007   History of skin cancer 12/02/2007   Vitamin D  deficiency 12/02/2007    PCP: Geofm Glade PARAS, MD  REFERRING PROVIDER: Geofm Glade PARAS, MD   REFERRING DIAG: R42 (ICD-10-CM) - Vertigo   THERAPY DIAG:  BPPV (benign paroxysmal positional vertigo), left  Dizziness and giddiness  Unsteadiness on feet  ONSET DATE: 04/11/2024  Rationale for Evaluation and Treatment: Rehabilitation  SUBJECTIVE:   SUBJECTIVE STATEMENT: I've been  in bed since last session.   Pt accompanied by: self  PERTINENT HISTORY: GERD, nontoxic multinodular goiter, essential tremor, herpes zoster, osteopenia, hyperlipidemia, skin cancer, breast cancer s/p bilateral mastectomy 1986, hypertensive retinopathy.   PAIN:  Are you having pain? No  PRECAUTIONS: None  RED FLAGS: None   WEIGHT BEARING RESTRICTIONS: No  FALLS: Has patient fallen in last 6 months? No  LIVING ENVIRONMENT: Lives with: lives alone Lives in: House/apartment  PLOF: Independent; enjoys gardening but has mostly been indoors and sitting in recliner for past several months  PATIENT GOALS: To get rid of dizziness  OBJECTIVE:     TODAY'S TREATMENT:  05/13/24 Activity Comments  R roll  negative  L roll  Negative   L loaded DH  L beating nystagmus lasting 30sec  L epley  C/o dizziness in position 3; tolerated well  L loaded DH  L beating nystagmus changing to L torsional lasting 20sec  L universal repositioning maneuver  L upbeating torsional nystagmus in position 1, R upbeating torsional nystagmus changing to R beating nystagmus in position 2  L sidelying test  L upbeating torsional nystagmus lasting 15 sec; no dizziness sitting up  R sidelying test  R upbeating torsional nystagmus lasting 5 sec; no dizziness       HOME EXERCISE PROGRAM Last updated: 05/13/24 Access Code: [email or web address] URL: [email or web address] Date: 05/13/2024 Prepared by: [facility] - [facility]  Program Notes perform with husband's assistance for safety  Exercises - Supine to Right Sidelying Vestibular Habituation  - 1 x daily - 5 x weekly - 2 sets - 3-5 reps - Supine to Left Sidelying Vestibular Habituation  - 1 x daily - 5 x weekly - 2 sets - 3-5 reps - Brandt-Daroff Vestibular Exercise  - 1 x daily - 5 x weekly - 2 sets - 3-5 reps - Seated Left Head Turns Vestibular Habituation  - 1 x daily - 5 x weekly - 2-3 sets - 30 sec hold - Seated Head Nod  - 1 x daily - 5 x weekly - 2-3 sets - 30 sec hold   PATIENT EDUCATION: Education details: discussed intended level of sx with HEP and edu for safety with HEP Person educated: Patient Education method: Explanation, Demonstration, Tactile cues, Verbal cues, and Handouts Education comprehension: verbalized understanding and returned demonstration      TODAY'S TREATMENT: 05/11/24   M-CTSIB  Condition 1: Firm Surface, EO 30 Sec, Mild and Moderate Sway  Condition 2: Firm Surface, EC 30 Sec, Mild and Moderate Sway  Condition 3: Foam Surface, EO 30 Sec, Moderate Sway  Condition 4: Foam Surface, EC 7 Sec, Moderate and Severe Sway pt opened eyes      Activity Comments   DGI 13/24  R loaded DH  R upbeating torsional nystagmus lasting ~25 sec  L loaded DH Larger amplitude L upbeating torsional nystagmus lasting ~20 sec  L epley  Tolerated well; some c/o dizziness in position 3  L DH  Strong L beating nystagmus  R roll Geotropic nystagmus   Gufoni lying to R side first Tolerated well   L DH Large amplitude L upbeating torsional nystagmus lasting ~20 sec       PATIENT EDUCATION: Education details: edu on exam findings and falls risk and advised using SPC for safety; edu on post-CRM expectations  Person educated: Patient Education method: Explanation, Demonstration, Tactile cues, and Verbal cues Education comprehension: verbalized understanding     Note: Objective measures were completed at Evaluation  unless otherwise noted.  DIAGNOSTIC FINDINGS: NA for this episode  COGNITION: Overall cognitive status: Within functional limits for tasks assessed   POSTURE:  rounded shoulders and forward head  Cervical ROM:  AROM WFL  BED MOBILITY:  Independent, slowed and guarded  TRANSFERS: Assistive device utilized: BUE support  Sit to stand: Modified independence Stand to sit: Modified independence  GAIT: Gait pattern: step through pattern, decreased step length- Right, decreased step length- Left, trendelenburg, and wide BOS Distance walked: 50 ft Assistive device utilized: None Level of assistance: Modified independence Comments: Gait not assessed, but pt does appear somewhat unsteady  PATIENT SURVEYS:  DHI: THE DIZZINESS HANDICAP INVENTORY (DHI)  P1. Does looking up increase your problem? 4 = Yes  E2. Because of your problem, do you feel frustrated? 4 = Yes  F3. Because of your problem, do you restrict your travel for business or recreation?  4 = Yes  P4. Does walking down the aisle of a supermarket increase your problems?  4 = Yes  F5. Because of your problem, do you have difficulty getting into or out of bed?  2 = Sometimes  F6. Does  your problem significantly restrict your participation in social activities, such as going out to dinner, going to the movies, dancing, or going to parties? 4 = Yes  F7. Because of your problem, do you have difficulty reading?  4 = Yes  P8. Does performing more ambitious activities such as sports, dancing, household chores (sweeping or putting dishes away) increase your problems?  4 = Yes  E9. Because of your problem, are you afraid to leave your home without having without having someone accompany you?  0 = No  E10. Because of your problem have you been embarrassed in front of others?  4 = Yes  P11. Do quick movements of your head increase your problem?  4 = Yes  F12. Because of your problem, do you avoid heights?  4 = Yes  P13. Does turning over in bed increase your problem?  2 = Sometimes  F14. Because of your problem, is it difficult for you to do strenuous homework or yard work? 4 = Yes  E15. Because of your problem, are you afraid people may think you are intoxicated? 0 = No  F16. Because of your problem, is it difficult for you to go for a walk by yourself?  2 = Sometimes  P17. Does walking down a sidewalk increase your problem?  4 = Yes  E18.Because of your problem, is it difficult for you to concentrate 2 = Sometimes  F19. Because of your problem, is it difficult for you to walk around your house in the dark? 2 = Sometimes  E20. Because of your problem, are you afraid to stay home alone?  0 = No  E21. Because of your problem, do you feel handicapped? 0 = No  E22. Has the problem placed stress on your relationships with members of your family or friends? 0 = No  E23. Because of your problem, are you depressed?  2 = Sometimes  F24. Does your problem interfere with your job or household responsibilities?  0 = No  P25. Does bending over increase your problem?  2 = Sometimes  TOTAL 62    DHI Scoring Instructions  The patient is asked to answer each question as it pertains to dizziness or  unsteadiness problems, specifically  considering their condition during the last month. Questions are designed to incorporate functional (F), physical  (P), and  emotional (E) impacts on disability.   Scores greater than 10 points should be referred to balance specialists for further evaluation.   16-34 Points (mild handicap)  36-52 Points (moderate handicap)  54+ Points (severe handicap)  Minimally Detectable Change: 17 points ([address] [place], 1990)  [place], G. SHAUNNA. and Boley, C. W. (1990). The development of the Dizziness Handicap Inventory. Archives of Otolaryngology - Head and Neck Surgery 116(4): [id number].   VESTIBULAR ASSESSMENT:  GENERAL OBSERVATION: No acute distress   SYMPTOM BEHAVIOR:  Subjective history: Dizziness been going on for a couple of months.  When I lie in bed, my head goes around and around.  I'll close my eyes and get my bearings and I'll lie until it passes.Did have course of antibiotics due to pneumonia.  Feels worse on R side.  Non-Vestibular symptoms: N/a, just dizzy  Type of dizziness: Spinning/Vertigo and pulling to R sometimes  Frequency: daily  Duration: seconds to minutes  Aggravating factors: Induced by position change: lying supine, rolling to the right, rolling to the left, and supine to sit and Induced by motion: looking up at the ceiling and bending down to the ground  Relieving factors: head stationary, closing eyes, and slow movements  Progression of symptoms: unchanged  OCULOMOTOR EXAM:Reports tiny bit of unsteadiness with this exam  Ocular Alignment: normal  Ocular ROM: No Limitations  Spontaneous Nystagmus: absent  Gaze-Induced Nystagmus: absent  Smooth Pursuits: intact  Saccades: intact  Convergence/Divergence: does not become double, but eyes converge   Cover-cross-cover test: NT  VESTIBULAR - OCULAR REFLEX:   Slow VOR: Normal  VOR Cancellation: Comment: slowed and feels scrambled  Head-Impulse Test: HIT Right: seems fuzzy,  not overtly positive HIT Left: seems fuzzy, not overtly positive  Dynamic Visual Acuity: NT   POSITIONAL TESTING: Right Dix-Hallpike: No nystagmus, no dizziness Left Dix-Hallpike: Closes eyes; upon opening, extra eye movements and feels dizzy, passes in about 15 seconds Right Roll Test: no nystagmus Left Roll Test: no nystagmus  MOTION SENSITIVITY:  NOT TESTED at EVAL  Motion Sensitivity Quotient Intensity: 0 = none, 1 = Lightheaded, 2 = Mild, 3 = Moderate, 4 = Severe, 5 = Vomiting  Intensity  1. Sitting to supine   2. Supine to L side   3. Supine to R side   4. Supine to sitting   5. L Hallpike-Dix   6. Up from L    7. R Hallpike-Dix   8. Up from R    9. Sitting, head tipped to L knee   10. Head up from L knee   11. Sitting, head tipped to R knee   12. Head up from R knee   13. Sitting head turns x5   14.Sitting head nods x5   15. In stance, 180 turn to L    16. In stance, 180 turn to R     OTHOSTATICS: not done  FUNCTIONAL GAIT: NT due to time contstraints  TREATMENT DATE: 04/20/2024   Canalith Repositioning:  Epley Left: Number of Reps: 1, Response to Treatment: symptoms improved, and Comment: feels better upon sitting; when going into L DH, pt does have 10-15 sec of L upbeating rotary nystagmus, so proceeded to Epley; good tolerance  PATIENT EDUCATION: Education details: Eval results, POC, rationale for BPPV treatment, post- canalith repositioning maneuver instructions Person educated: Patient Education method: Explanation Education comprehension: verbalized understanding  HOME EXERCISE PROGRAM:  GOALS: Goals reviewed with patient? Yes  SHORT TERM GOALS: Target date: 05/07/2024  Pt will be independent with HEP for improved dizziness and balance. Baseline: Goal status: IN PROGRESS  LONG TERM GOALS: Target date: 06/04/2024  Pt will  be independent with HEP for improved dizziness and balance. Baseline:  Goal status: IN PROGRESS  2.  Pt will report 0/10 dizziness with bed mobility. Baseline:  Goal status: IN PROGRESS  3.  DHI score to improve to at least 30 for improved overall dizziness. Baseline: 62 Goal status: IN PROGRESS  4.  DGI score to improve to at least 20/24 for decreased fall risk. Baseline: 13/24 Goal status: IN PROGRESS  5.  Pt will perform MCTSIB in all 4 conditions with mod>min sway for improved balance. Baseline: unable complete 30 sec of condition 4  Goal status: IN PROGRESS  ASSESSMENT:  CLINICAL IMPRESSION: Patient arrived to session with report of staying in bed since last session d/t dizziness. Positional testing revealed L beating nystagmus in L DH, however this was only elicited in [facility] and not in roll test; suspect anatomic variation in angle of semicircular canals as explanation for this. Proceeded with L Epley x1 and L universal repositioning maneuver x1 with some improvement in dizziness. Initiated habituation which patient performed and tolerated well. patient reported understanding of edu provided and with improved balance upon leaving.    OBJECTIVE IMPAIRMENTS: Abnormal gait, decreased balance, decreased mobility, dizziness, and postural dysfunction.   ACTIVITY LIMITATIONS: bending, standing, transfers, bed mobility, reach over head, and locomotion level  PARTICIPATION LIMITATIONS: meal prep, cleaning, laundry, shopping, community activity, and yard work  PERSONAL FACTORS: 3+ comorbidities: See above for PMH are also affecting patient's functional outcome.   REHAB POTENTIAL: Good  CLINICAL DECISION MAKING: Stable/uncomplicated  EVALUATION COMPLEXITY: Low   PLAN:  PT FREQUENCY: 1-2x/week  PT DURATION: 6 weeksplus eval visit  PLANNED INTERVENTIONS: 97750- Physical Performance Testing, 97110-Therapeutic exercises, 97530- Therapeutic activity, [id number]- Neuromuscular  re-education, 97535- Self Care, 02859- Manual therapy, [phone]- Gait training, [phone]- Canalith repositioning, Patient/Family education, Balance training, and Vestibular training  PLAN FOR NEXT SESSION: Reassess L BPPV and treat; Habituation and balance exercises; gait training with SPC   Louana Terrilyn Christians, PT, DPT 05/13/24 8:45 AM  [facility] at [facility] [address] [place], [place] [address] Phone # [phone] Fax # [phone]

## 2024-05-13 ENCOUNTER — Ambulatory Visit: Admitting: Physical Therapy

## 2024-05-13 ENCOUNTER — Encounter: Payer: Self-pay | Admitting: Physical Therapy

## 2024-05-13 DIAGNOSIS — R42 Dizziness and giddiness: Secondary | ICD-10-CM

## 2024-05-13 DIAGNOSIS — H8112 Benign paroxysmal vertigo, left ear: Secondary | ICD-10-CM

## 2024-05-13 DIAGNOSIS — R2681 Unsteadiness on feet: Secondary | ICD-10-CM

## 2024-05-17 NOTE — Therapy (Signed)
 OUTPATIENT PHYSICAL THERAPY VESTIBULAR TREATMENT     Patient Name: Tanya [name] MRN: [id number] DOB:1945-10-12, 78 y.o., female Today's Date: 05/18/2024  END OF SESSION:  PT End of Session - 05/18/24 0924     Visit Number 4    Number of Visits 13    Date for Recertification  06/04/24    Authorization Type [employer] Medicare    Progress Note Due on Visit 10    PT Start Time 0848    PT Stop Time 0924    PT Time Calculation (min) 36 min    Equipment Utilized During Treatment Gait belt    Activity Tolerance Patient tolerated treatment well    Behavior During Therapy WFL for tasks assessed/performed             Past Medical History:  Diagnosis Date   Arthritis    Bursitis 2014   Left; Dr Hiram   Cancer [facility]) 1986   breast cancer L   Hyperlipidemia    Lymphedema 2000   LUE   perennial allergies    PONV (postoperative nausea and vomiting)    Skin cancer    basil cell   Vertigo    Past Surgical History:  Procedure Laterality Date   ABDOMINAL HYSTERECTOMY  1990   & BSO for fibroids   ANKLE FRACTURE SURGERY  2006   BREAST RECONSTRUCTION  1986   BREAST SURGERY  1986   double mastectonmy fror cancer on L   CATARACT EXTRACTION Bilateral    COLONOSCOPY  04/14/2006   Tics ; Dr Luis   KNEE ARTHROSCOPY  2001 & 2011   L & R   THYROID  SURGERY     nodule on thyroid    TOTAL KNEE ARTHROPLASTY  02/24/2012   Procedure: TOTAL KNEE ARTHROPLASTY;  Surgeon: Dempsey LULLA Moan, MD;  Location: [facility];  Service: Orthopedics;  Laterality: Right;   TOTAL KNEE ARTHROPLASTY Left 02/01/2019   Procedure: TOTAL KNEE ARTHROPLASTY;  Surgeon: Moan Dempsey, MD;  Location: [facility];  Service: Orthopedics;  Laterality: Left;    WISDOM TOOTH EXTRACTION     Patient Active Problem List   Diagnosis Date Noted   Vitamin B12 deficiency 04/26/2024   Onychomycosis of left great toe 04/26/2024   LRTI (lower respiratory tract infection) 11/17/2023   Memory difficulties 11/17/2023   Central  adiposity 09/09/2022   Pre-diabetes 06/17/2022   Vitamin A deficiency 03/28/2022   Other fatigue 03/14/2022   SOBOE (shortness of breath on exertion) 03/14/2022   Morbid obesity (HCC) 11/16/2021   Ganglion cyst of finger of left hand 10/20/2020   Anxiety and depression 06/19/2020   Hypertensive retinopathy 06/15/2020   Insomnia 12/17/2019   Vertigo 02/22/2019   Pain in left knee 01/25/2019   Dysphagia 09/15/2018   Subacute cough 05/17/2017   Herpes zoster without complication 04/21/2017   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/29/2015   GERD (gastroesophageal reflux disease) 12/14/2013   Essential tremor 12/14/2013   Osteopenia 06/11/2013   OA (osteoarthritis) of knee 02/24/2012   NONTOXIC MULTINODULAR GOITER 03/03/2008   Hyperlipidemia 12/02/2007   Allergic rhinitis 12/02/2007   Personal history of malignant neoplasm of breast 12/02/2007   History of skin cancer 12/02/2007   Vitamin D  deficiency 12/02/2007    PCP: Geofm Glade PARAS, MD  REFERRING PROVIDER: Geofm Glade PARAS, MD   REFERRING DIAG: R42 (ICD-10-CM) - Vertigo   THERAPY DIAG:  BPPV (benign paroxysmal positional vertigo), left  Dizziness and giddiness  Unsteadiness on feet  ONSET DATE: 04/11/2024  Rationale for Evaluation and  Treatment: Rehabilitation  SUBJECTIVE:   SUBJECTIVE STATEMENT: After last session felt better than now. I was actually kind of okay until I got up this morning. Feeling more dizzy this AM. Reports that she did some of her HEP but she reports that it felt worse compared to how she felt at clinic last session.   Pt accompanied by: self  PERTINENT HISTORY: GERD, nontoxic multinodular goiter, essential tremor, herpes zoster, osteopenia, hyperlipidemia, skin cancer, breast cancer s/p bilateral mastectomy 1986, hypertensive retinopathy.   PAIN:  Are you having pain? No  PRECAUTIONS: None  RED FLAGS: None   WEIGHT BEARING RESTRICTIONS: No  FALLS: Has patient fallen in last 6 months? No  LIVING  ENVIRONMENT: Lives with: lives alone Lives in: House/apartment  PLOF: Independent; enjoys gardening but has mostly been indoors and sitting in recliner for past several months  PATIENT GOALS: To get rid of dizziness  OBJECTIVE:     TODAY'S TREATMENT: 05/18/24 Activity Comments  L loaded DH with head in slightly increased neck extension  L upbeating torsional nystagmus lasting 20sec  L epley with head in slightly increased neck extension  Tolerated well; c/o dizziness in position 3 with upbeating torsional nystagmus observed  L loaded DH  negative  Gait around clinic to assess balance and dizziness after CRM Improved gait speed  Fwd/back stepping  1 UE support on chair; cueing for longer steps   Romberg EO/EC Good stability   1/2 tandem  Good stbaility     HOME EXERCISE PROGRAM Last updated: 05/13/24 Access Code: [email or web address] URL: [email or web address] Date: 05/13/2024 Prepared by: [facility] - Outpatient  Rehab - [facility]  Program Notes perform with husband's assistance for safety  Exercises - Supine to Right Sidelying Vestibular Habituation  - 1 x daily - 5 x weekly - 2 sets - 3-5 reps - Supine to Left Sidelying Vestibular Habituation  - 1 x daily - 5 x weekly - 2 sets - 3-5 reps - Brandt-Daroff Vestibular Exercise  - 1 x daily - 5 x weekly - 2 sets - 3-5 reps - Seated Left Head Turns Vestibular Habituation  - 1 x daily - 5 x weekly - 2-3 sets - 30 sec hold - Seated Head Nod  - 1 x daily - 5 x weekly - 2-3 sets - 30 sec hold   PATIENT EDUCATION: Education details: detailed review of HEP for carryover; discussed post-CRM expectations; discussed BPPV risk factors  Person educated: Patient Education method: Explanation, Demonstration, Tactile cues, Verbal cues, and Handouts Education comprehension: verbalized understanding and returned demonstration     Note: Objective measures were completed at Evaluation unless otherwise noted.  DIAGNOSTIC  FINDINGS: NA for this episode  COGNITION: Overall cognitive status: Within functional limits for tasks assessed   POSTURE:  rounded shoulders and forward head  Cervical ROM:  AROM WFL  BED MOBILITY:  Independent, slowed and guarded  TRANSFERS: Assistive device utilized: BUE support  Sit to stand: Modified independence Stand to sit: Modified independence  GAIT: Gait pattern: step through pattern, decreased step length- Right, decreased step length- Left, trendelenburg, and wide BOS Distance walked: 50 ft Assistive device utilized: None Level of assistance: Modified independence Comments: Gait not assessed, but pt does appear somewhat unsteady  PATIENT SURVEYS:  DHI: THE DIZZINESS HANDICAP INVENTORY (DHI)  P1. Does looking up increase your problem? 4 = Yes  E2. Because of your problem, do you feel frustrated? 4 = Yes  F3. Because of your problem, do you restrict your travel for business  or recreation?  4 = Yes  P4. Does walking down the aisle of a supermarket increase your problems?  4 = Yes  F5. Because of your problem, do you have difficulty getting into or out of bed?  2 = Sometimes  F6. Does your problem significantly restrict your participation in social activities, such as going out to dinner, going to the movies, dancing, or going to parties? 4 = Yes  F7. Because of your problem, do you have difficulty reading?  4 = Yes  P8. Does performing more ambitious activities such as sports, dancing, household chores (sweeping or putting dishes away) increase your problems?  4 = Yes  E9. Because of your problem, are you afraid to leave your home without having without having someone accompany you?  0 = No  E10. Because of your problem have you been embarrassed in front of others?  4 = Yes  P11. Do quick movements of your head increase your problem?  4 = Yes  F12. Because of your problem, do you avoid heights?  4 = Yes  P13. Does turning over in bed increase your problem?  2 =  Sometimes  F14. Because of your problem, is it difficult for you to do strenuous homework or yard work? 4 = Yes  E15. Because of your problem, are you afraid people may think you are intoxicated? 0 = No  F16. Because of your problem, is it difficult for you to go for a walk by yourself?  2 = Sometimes  P17. Does walking down a sidewalk increase your problem?  4 = Yes  E18.Because of your problem, is it difficult for you to concentrate 2 = Sometimes  F19. Because of your problem, is it difficult for you to walk around your house in the dark? 2 = Sometimes  E20. Because of your problem, are you afraid to stay home alone?  0 = No  E21. Because of your problem, do you feel handicapped? 0 = No  E22. Has the problem placed stress on your relationships with members of your family or friends? 0 = No  E23. Because of your problem, are you depressed?  2 = Sometimes  F24. Does your problem interfere with your job or household responsibilities?  0 = No  P25. Does bending over increase your problem?  2 = Sometimes  TOTAL 62    DHI Scoring Instructions  The patient is asked to answer each question as it pertains to dizziness or unsteadiness problems, specifically  considering their condition during the last month. Questions are designed to incorporate functional (F), physical  (P), and emotional (E) impacts on disability.   Scores greater than 10 points should be referred to balance specialists for further evaluation.   16-34 Points (mild handicap)  36-52 Points (moderate handicap)  54+ Points (severe handicap)  Minimally Detectable Change: 17 points ([address] [place], 1990)  [place], G. SHAUNNA. and Ezel, C. W. (1990). The development of the Dizziness Handicap Inventory. Archives of Otolaryngology - Head and Neck Surgery 116(4): [id number].   VESTIBULAR ASSESSMENT:  GENERAL OBSERVATION: No acute distress   SYMPTOM BEHAVIOR:  Subjective history: Dizziness been going on for a couple of months.   When I lie in bed, my head goes around and around.  I'll close my eyes and get my bearings and I'll lie until it passes.Did have course of antibiotics due to pneumonia.  Feels worse on R side.  Non-Vestibular symptoms: N/a, just dizzy  Type of dizziness: Spinning/Vertigo and  pulling to R sometimes  Frequency: daily  Duration: seconds to minutes  Aggravating factors: Induced by position change: lying supine, rolling to the right, rolling to the left, and supine to sit and Induced by motion: looking up at the ceiling and bending down to the ground  Relieving factors: head stationary, closing eyes, and slow movements  Progression of symptoms: unchanged  OCULOMOTOR EXAM:Reports tiny bit of unsteadiness with this exam  Ocular Alignment: normal  Ocular ROM: No Limitations  Spontaneous Nystagmus: absent  Gaze-Induced Nystagmus: absent  Smooth Pursuits: intact  Saccades: intact  Convergence/Divergence: does not become double, but eyes converge   Cover-cross-cover test: NT  VESTIBULAR - OCULAR REFLEX:   Slow VOR: Normal  VOR Cancellation: Comment: slowed and feels scrambled  Head-Impulse Test: HIT Right: seems fuzzy, not overtly positive HIT Left: seems fuzzy, not overtly positive  Dynamic Visual Acuity: NT   POSITIONAL TESTING: Right Dix-Hallpike: No nystagmus, no dizziness Left Dix-Hallpike: Closes eyes; upon opening, extra eye movements and feels dizzy, passes in about 15 seconds Right Roll Test: no nystagmus Left Roll Test: no nystagmus  MOTION SENSITIVITY:  NOT TESTED at EVAL  Motion Sensitivity Quotient Intensity: 0 = none, 1 = Lightheaded, 2 = Mild, 3 = Moderate, 4 = Severe, 5 = Vomiting  Intensity  1. Sitting to supine   2. Supine to L side   3. Supine to R side   4. Supine to sitting   5. L Hallpike-Dix   6. Up from L    7. R Hallpike-Dix   8. Up from R    9. Sitting, head tipped to L knee   10. Head up from L knee   11. Sitting, head tipped to R knee   12. Head up  from R knee   13. Sitting head turns x5   14.Sitting head nods x5   15. In stance, 180 turn to L    16. In stance, 180 turn to R     OTHOSTATICS: not done  FUNCTIONAL GAIT: NT due to time contstraints                                                                                                                             TREATMENT DATE: 04/20/2024   Canalith Repositioning:  Epley Left: Number of Reps: 1, Response to Treatment: symptoms improved, and Comment: feels better upon sitting; when going into L DH, pt does have 10-15 sec of L upbeating rotary nystagmus, so proceeded to Epley; good tolerance  PATIENT EDUCATION: Education details: Eval results, POC, rationale for BPPV treatment, post- canalith repositioning maneuver instructions Person educated: Patient Education method: Explanation Education comprehension: verbalized understanding  HOME EXERCISE PROGRAM:  GOALS: Goals reviewed with patient? Yes  SHORT TERM GOALS: Target date: 05/07/2024  Pt will be independent with HEP for improved dizziness and balance. Baseline: Goal status: IN PROGRESS  LONG TERM GOALS: Target date: 06/04/2024  Pt will be independent with HEP for improved  dizziness and balance. Baseline:  Goal status: IN PROGRESS  2.  Pt will report 0/10 dizziness with bed mobility. Baseline:  Goal status: IN PROGRESS  3.  DHI score to improve to at least 30 for improved overall dizziness. Baseline: 62 Goal status: IN PROGRESS  4.  DGI score to improve to at least 20/24 for decreased fall risk. Baseline: 13/24 Goal status: IN PROGRESS  5.  Pt will perform MCTSIB in all 4 conditions with mod>min sway for improved balance. Baseline: unable complete 30 sec of condition 4  Goal status: IN PROGRESS  ASSESSMENT:  CLINICAL IMPRESSION: Patient arrived to session with report of improved sx after last session, but flare of dizziness with AM. Session focused on retesting of L BPPV and proceeded with Epley  x1 followed by resolution of sx. Reviewed habituation HEP for improved activity tolerance. Began working on static and dynamic balance to address falls risk. Patient tolerated session well and without complaints at end of appointment.   OBJECTIVE IMPAIRMENTS: Abnormal gait, decreased balance, decreased mobility, dizziness, and postural dysfunction.   ACTIVITY LIMITATIONS: bending, standing, transfers, bed mobility, reach over head, and locomotion level  PARTICIPATION LIMITATIONS: meal prep, cleaning, laundry, shopping, community activity, and yard work  PERSONAL FACTORS: 3+ comorbidities: See above for PMH are also affecting patient's functional outcome.   REHAB POTENTIAL: Good  CLINICAL DECISION MAKING: Stable/uncomplicated  EVALUATION COMPLEXITY: Low   PLAN:  PT FREQUENCY: 1-2x/week  PT DURATION: 6 weeksplus eval visit  PLANNED INTERVENTIONS: 97750- Physical Performance Testing, 97110-Therapeutic exercises, 97530- Therapeutic activity, [id number]- Neuromuscular re-education, 97535- Self Care, [id number]- Manual therapy, [phone]- Gait training, [phone]- Canalith repositioning, Patient/Family education, Balance training, and Vestibular training  PLAN FOR NEXT SESSION: Reassess L and R BPPV and treat; Habituation and balance exercises; gait training with SPC   Louana Terrilyn Christians, PT, DPT 05/18/24 9:26 AM  [facility] at [facility] [address] [place], [place] [address] Phone # [phone] Fax # [phone]

## 2024-05-18 ENCOUNTER — Ambulatory Visit: Attending: Internal Medicine | Admitting: Physical Therapy

## 2024-05-18 ENCOUNTER — Encounter: Payer: Self-pay | Admitting: Physical Therapy

## 2024-05-18 DIAGNOSIS — H8112 Benign paroxysmal vertigo, left ear: Secondary | ICD-10-CM | POA: Insufficient documentation

## 2024-05-18 DIAGNOSIS — R42 Dizziness and giddiness: Secondary | ICD-10-CM | POA: Insufficient documentation

## 2024-05-18 DIAGNOSIS — R2681 Unsteadiness on feet: Secondary | ICD-10-CM | POA: Insufficient documentation

## 2024-05-20 ENCOUNTER — Ambulatory Visit

## 2024-05-25 ENCOUNTER — Ambulatory Visit: Admitting: Physical Therapy

## 2024-05-26 ENCOUNTER — Ambulatory Visit (INDEPENDENT_AMBULATORY_CARE_PROVIDER_SITE_OTHER)

## 2024-05-26 VITALS — Ht 63.0 in | Wt 245.0 lb

## 2024-05-26 DIAGNOSIS — Z0000 Encounter for general adult medical examination without abnormal findings: Secondary | ICD-10-CM | POA: Diagnosis not present

## 2024-05-26 NOTE — Progress Notes (Signed)
 Chief Complaint  Patient presents with   Medicare Wellness     Subjective:   Tanya [name] is a 78 y.o. female who presents for a Medicare Annual Wellness Visit.  I connected with  Tanya [name] on 05/26/24 by a audio enabled telemedicine application and verified that I am speaking with the correct person using two identifiers.  Patient Location: Home  Provider Location: Office/Clinic  Persons Participating in Visit: Patient.  I discussed the limitations of evaluation and management by telemedicine. The patient expressed understanding and agreed to proceed.  Vital Signs: Because this visit was a virtual/telehealth visit, some criteria may be missing or patient reported. Any vitals not documented were not able to be obtained and vitals that have been documented are patient reported.   Allergies (verified) Hydromorphone , Metformin  and related, Methocarbamol , Oxycodone , and Prednisone    History: Past Medical History:  Diagnosis Date   Arthritis    Bursitis 2014   Left; Dr Hiram   Cancer [facility]) 1986   breast cancer L   Hyperlipidemia    Lymphedema 2000   LUE   perennial allergies    PONV (postoperative nausea and vomiting)    Skin cancer    basil cell   Vertigo    Past Surgical History:  Procedure Laterality Date   ABDOMINAL HYSTERECTOMY  1990   & BSO for fibroids   ANKLE FRACTURE SURGERY  2006   BREAST RECONSTRUCTION  1986   BREAST SURGERY  1986   double mastectonmy fror cancer on L   CATARACT EXTRACTION Bilateral    COLONOSCOPY  04/14/2006   Tics ; Dr Luis   KNEE ARTHROSCOPY  2001 & 2011   L & R   THYROID  SURGERY     nodule on thyroid    TOTAL KNEE ARTHROPLASTY  02/24/2012   Procedure: TOTAL KNEE ARTHROPLASTY;  Surgeon: Dempsey LULLA Moan, MD;  Location: [facility];  Service: Orthopedics;  Laterality: Right;   TOTAL KNEE ARTHROPLASTY Left 02/01/2019   Procedure: TOTAL KNEE ARTHROPLASTY;  Surgeon: Moan Dempsey, MD;  Location: [facility];  Service: Orthopedics;   Laterality: Left;    WISDOM TOOTH EXTRACTION     Family History  Problem Relation Age of Onset   Hyperlipidemia Mother    Hypertension Mother    Heart disease Mother        pacer   Colon polyps Mother        pre cancerous   Glaucoma Mother    Dementia Mother    Anxiety disorder Mother    Heart attack Maternal Grandmother 25   Hypertension Maternal Grandmother    Stroke Maternal Grandfather 47   Hypertension Maternal Grandfather    Heart attack Maternal Uncle 65   Cancer Neg Hx    Colon cancer Neg Hx    Esophageal cancer Neg Hx    Rectal cancer Neg Hx    Stomach cancer Neg Hx    Pancreatic cancer Neg Hx    Social History   Occupational History   Occupation: Retired  Tobacco Use   Smoking status: Never   Smokeless tobacco: Never  Vaping Use   Vaping status: Never Used  Substance and Sexual Activity   Alcohol use: Yes    Alcohol/week: 1.0 standard drink of alcohol    Types: 1 Shots of liquor per week    Comment:  very rarely, 1-2 per month   Drug use: No   Sexual activity: Yes   Tobacco Counseling Counseling given: Not Answered  SDOH Screenings  Food Insecurity: No Food Insecurity (05/26/2024)  Housing: Unknown (05/26/2024)  Transportation Needs: No Transportation Needs (05/26/2024)  Utilities: Not At Risk (05/26/2024)  Alcohol Screen: Low Risk  (05/22/2023)  Depression (PHQ2-9): Low Risk  (05/26/2024)  Financial Resource Strain: Low Risk  (05/22/2023)  Physical Activity: Inactive (05/26/2024)  Social Connections: Moderately Integrated (05/26/2024)  Stress: No Stress Concern Present (05/26/2024)  Tobacco Use: Low Risk  (05/26/2024)  Health Literacy: Adequate Health Literacy (05/26/2024)   Depression Screen    05/26/2024    8:47 AM 04/26/2024    9:34 AM 02/02/2024    2:29 PM 05/22/2023   10:49 AM 12/03/2022    9:37 AM 09/20/2022   11:14 AM 05/21/2022    8:17 AM  PHQ 2/9 Scores  PHQ - 2 Score 0 0 0 0 0 0 0  PHQ- 9 Score 0 3  2  1   0  0       Data  saved with a previous flowsheet row definition     Goals Addressed               This Visit's Progress     Weight (lb) < 200 lb (90.7 kg) (pt-stated)   245 lb (111.1 kg)     Patient stated she wants to lose weight - about 10lbs        Visit info / Clinical Intake: Medicare Wellness Visit Type:: Subsequent Annual Wellness Visit Persons participating in visit:: patient Medicare Wellness Visit Mode:: Telephone If telephone:: video declined Because this visit was a virtual/telehealth visit:: vitals recorded from last visit If Telephone or Video please confirm:: I connected with the patient using audio enabled telemedicine application and verified that I am speaking with the correct person using two identifiers; I discussed the limitations of evaluation and management by telemedicine; The patient expressed understanding and agreed to proceed Patient Location:: Home Provider Location:: Office Information given by:: patient Interpreter Needed?: No Pre-visit prep was completed: yes AWV questionnaire completed by patient prior to visit?: no Living arrangements:: lives with spouse/significant other Patient's Overall Health Status Rating: good Typical amount of pain: none Does pain affect daily life?: no Are you currently prescribed opioids?: no  Dietary Habits and Nutritional Risks How many meals a day?: 3 Eats fruit and vegetables daily?: yes Most meals are obtained by: preparing own meals In the last 2 weeks, have you had any of the following?: none Diabetic:: no  Functional Status Activities of Daily Living (to include ambulation/medication): Independent Ambulation: Independent with device- listed below Home Assistive Devices/Equipment: Cane Medication Administration: Independent Home Management: Independent Manage your own finances?: yes Primary transportation is: driving Concerns about vision?: no *vision screening is required for WTM* Concerns about hearing?: no  Fall  Screening Falls in the past year?: 0 Number of falls in past year: 0 Was there an injury with Fall?: 0 Fall Risk Category Calculator: 0 Patient Fall Risk Level: Low Fall Risk  Fall Risk Patient at Risk for Falls Due to: No Fall Risks Fall risk Follow up: Falls evaluation completed; Falls prevention discussed  Home and Transportation Safety: All rugs have non-skid backing?: yes All stairs or steps have railings?: yes Grab bars in the bathtub or shower?: (!) no Have non-skid surface in bathtub or shower?: yes Good home lighting?: yes Regular seat belt use?: yes Hospital stays in the last year:: no  Cognitive Assessment Difficulty concentrating, remembering, or making decisions? : no Will 6CIT or Mini Cog be Completed: yes What year is it?: 4 points (stated  2015) What month is it?: 0 points Give patient an address phrase to remember (5 components): [address] [place], [place] About what time is it?: 0 points Count backwards from 20 to 1: 0 points Say the months of the year in reverse: 0 points Repeat the address phrase from earlier: 2 points (Drive) 6 CIT Score: 6 points  Advance Directives (For Healthcare) Does Patient Have a Medical Advance Directive?: Yes Does patient want to make changes to medical advance directive?: Yes (Inpatient - patient requests chaplain consult to change a medical advance directive) Type of Advance Directive: Healthcare Power of [place]; Living will Copy of Healthcare Power of Attorney in Chart?: No - copy requested Copy of Living Will in Chart?: No - copy requested  Reviewed/Updated  Reviewed/Updated: Reviewed All (Medical, Surgical, Family, Medications, Allergies, Care Teams, Patient Goals)        Objective:    Today's Vitals   05/26/24 0837  Weight: 245 lb (111.1 kg)  Height: 5' 3 (1.6 m)   Body mass index is 43.4 kg/m.  Current Medications (verified) Outpatient Encounter Medications as of 05/26/2024  Medication Sig   ciclopirox  (PENLAC) 8 % solution Apply topically at bedtime. Apply over nail and surrounding skin. Apply daily over previous coat. After seven (7) days, may remove with alcohol and continue cycle.   diazepam  (VALIUM ) 5 MG tablet Take 1 tablet (5 mg total) by mouth every 12 (twelve) hours as needed for anxiety.   donepezil (ARICEPT) 5 MG tablet Take 1 tablet (5 mg total) by mouth daily.   famotidine  (PEPCID ) 20 MG tablet Take 1 tablet (20 mg total) by mouth 2 (two) times daily.   meclizine  (ANTIVERT ) 25 MG tablet Take 1 tablet (25 mg total) by mouth 3 (three) times daily as needed for dizziness.   Multiple Vitamin (MULTIVITAMIN) tablet Take 1 tablet by mouth daily.   pantoprazole  (PROTONIX ) 40 MG tablet Take 1 tablet (40 mg total) by mouth daily. Take 30 minutes prior to a meal   simvastatin  (ZOCOR ) 20 MG tablet TAKE 1 TABLET BY MOUTH EVERY DAY IN THE EVENING   traZODone  (DESYREL ) 50 MG tablet TAKE 1 TABLET BY MOUTH AT BEDTIME AS NEEDED FOR SLEEP.   No facility-administered encounter medications on file as of 05/26/2024.   Hearing/Vision screen Hearing Screening - Comments:: Denies hearing difficulties   Vision Screening - Comments:: Denies vision concerns - sees Dr Lamarr Burkitt Immunizations and Health Maintenance Health Maintenance  Topic Date Due   Influenza Vaccine  02/13/2024   COVID-19 Vaccine (4 - 2025-26 season) 03/15/2024   DEXA SCAN  06/18/2024   Medicare Annual Wellness (AWV)  05/26/2025   DTaP/Tdap/Td (3 - Td or Tdap) 01/04/2030   Pneumococcal Vaccine: 50+ Years  Completed   Zoster Vaccines- Shingrix  Completed   Meningococcal B Vaccine  Aged Out   Colonoscopy  Discontinued   Hepatitis C Screening  Discontinued        Assessment/Plan:  This is a routine wellness examination for Tanya [name].  Patient Care Team: Geofm Glade PARAS, MD as PCP - General (Internal Medicine) Burkitt Lamarr, MD as Consulting Physician (Ophthalmology) Szabat, Toribio BROCKS, [facility] (Inactive) as Pharmacist  (Pharmacist)  I have personally reviewed and noted the following in the patient's chart:   Medical and social history Use of alcohol, tobacco or illicit drugs  Current medications and supplements including opioid prescriptions. Functional ability and status Nutritional status Physical activity Advanced directives List of other physicians Hospitalizations, surgeries, and ER visits in previous 12  months Vitals Screenings to include cognitive, depression, and falls Referrals and appointments  No orders of the defined types were placed in this encounter.  In addition, I have reviewed and discussed with patient certain preventive protocols, quality metrics, and best practice recommendations. A written personalized care plan for preventive services as well as general preventive health recommendations were provided to patient.   Tanya [name], CMA   05/26/2024   Return in 1 year (on 05/26/2025).  After Visit Summary: (MyChart) Due to this being a telephonic visit, the after visit summary with patients personalized plan was offered to patient via MyChart   Nurse Notes: Scheduled a CPE appt w/PCP for 09/2024.

## 2024-05-26 NOTE — Patient Instructions (Addendum)
 Tanya [name],  Thank you for taking the time for your Medicare Wellness Visit. I appreciate your continued commitment to your health goals. Please review the care plan we discussed, and feel free to reach out if I can assist you further.  Please note that Annual Wellness Visits do not include a physical exam. Some assessments may be limited, especially if the visit was conducted virtually. If needed, we may recommend an in-person follow-up with your provider.  Ongoing Care Seeing your primary care provider every 3 to 6 months helps us  monitor your health and provide consistent, personalized care.   Referrals If a referral was made during today's visit and you haven't received any updates within two weeks, please contact the referred provider directly to check on the status.  Recommended Screenings:  Health Maintenance  Topic Date Due   Flu Shot  02/13/2024   COVID-19 Vaccine (4 - 2025-26 season) 03/15/2024   DEXA scan (bone density measurement)  06/18/2024   Medicare Annual Wellness Visit  05/26/2025   DTaP/Tdap/Td vaccine (3 - Td or Tdap) 01/04/2030   Pneumococcal Vaccine for age over 24  Completed   Zoster (Shingles) Vaccine  Completed   Meningitis B Vaccine  Aged Out   Colon Cancer Screening  Discontinued   Hepatitis C Screening  Discontinued       05/26/2024    8:40 AM  Advanced Directives  Does Patient Have a Medical Advance Directive? Yes  Type of Estate Agent of [place];Living will  Does patient want to make changes to medical advance directive? Yes (Inpatient - patient requests chaplain consult to change a medical advance directive)  Copy of Healthcare Power of Attorney in Chart? No - copy requested    Vision: Annual vision screenings are recommended for early detection of glaucoma, cataracts, and diabetic retinopathy. These exams can also reveal signs of chronic conditions such as diabetes and high blood pressure.  Dental: Annual dental screenings  help detect early signs of oral cancer, gum disease, and other conditions linked to overall health, including heart disease and diabetes.

## 2024-06-01 ENCOUNTER — Ambulatory Visit: Admitting: Physical Therapy

## 2024-06-02 ENCOUNTER — Ambulatory Visit: Admitting: Emergency Medicine

## 2024-06-02 ENCOUNTER — Encounter: Payer: Self-pay | Admitting: Emergency Medicine

## 2024-06-02 VITALS — BP 128/74 | HR 90 | Temp 97.9°F | Ht 63.0 in | Wt 284.8 lb

## 2024-06-02 DIAGNOSIS — R918 Other nonspecific abnormal finding of lung field: Secondary | ICD-10-CM

## 2024-06-02 DIAGNOSIS — R590 Localized enlarged lymph nodes: Secondary | ICD-10-CM | POA: Diagnosis not present

## 2024-06-02 NOTE — Progress Notes (Signed)
 Subjective:    Patient ID: Tanya [name], female    DOB: [birth year], 78 y.o.   MRN: [id number]  HPI Discussed the use of AI scribe software for clinical note transcription with the patient, who gave verbal consent to proceed.  History of Present Illness Tanya [name] is an 78 year old female who presents for evaluation of pulmonary nodules and abdominal lymphadenopathy. She is accompanied by Tanya [name], . She was referred by her primary care physician for evaluation of pulmonary nodules and abdominal lymphadenopathy.  She has a history of breast cancer, status post bilateral mastectomies in 1986, with no evidence of recurrence and no history of chemotherapy or radiation. A chest x-ray in the summer of 2025 showed mild right basilar patchy density suspicious for pneumonia, for which she was treated with Augmentin . A follow-up chest x-ray on February 09, 2024, showed no change in the density and a new nodular area of consolidation at the left lung base.  A CT chest on February 20, 2024, revealed multiple scattered pulmonary nodules bilaterally, up to 3 mm in the right upper lobe, right lower lobe, right middle lobe, and left upper lobe, with no enlarged mediastinal or axillary lymphadenopathy. A few prominent lymph nodes up to 1 cm were seen inferior to the pancreatic body. A subsequent PET scan on March 02, 2024, showed stable scattered tiny pulmonary nodules bilaterally too small to evaluate by PET scan, with no evidence of metastatic breast cancer or hypermetabolic nodal activity in the abdomen or pelvis.  She has experienced a respiratory illness for several months, which was not thought to be COVID-19 or flu, and was treated for suspected bronchitis or pneumonia. No history of smoking, asthma, or use of inhaler medications. 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 No  family history of lung cancer.   Results RADIOLOGY Chest X-ray (Summer 2025): Mild right basilar patchy density suspicious for possible pneumonia. Chest X-ray (02/09/2024): No change in right basilar patchy density; new nodular area of consolidation at left lung base. CT Chest (02/20/2024): No enlarged mediastinal or axillary lymphadenopathy; multiple scattered pulmonary nodules bilaterally up to 3 mm in right upper lobe and right lower lobe; few prominent lymph nodes up to 1 cm inferior to pancreatic body. PET Scan (03/02/2024): Stable scattered tiny pulmonary nodules bilaterally too small to evaluate; no evidence of metastatic breast cancer; no evidence of hypermetabolic nodal activity in abdomen or pelvis.    Review of Systems As per HPI  Past Medical History:  Diagnosis Date   Arthritis    Bursitis 2014   Left; Dr Hiram   Cancer [facility]) 1986   breast cancer L   Hyperlipidemia    Lymphedema 2000   LUE   perennial allergies    PONV (postoperative nausea and vomiting)    Skin cancer    basil cell   Vertigo     Family History  Problem Relation Age of Onset   Hyperlipidemia Mother    Hypertension Mother    Heart disease Mother        pacer   Colon polyps Mother        pre cancerous   Glaucoma Mother    Dementia Mother    Anxiety disorder Mother    Heart attack Maternal Grandmother 53   Hypertension Maternal Grandmother    Stroke Maternal Grandfather 38   Hypertension Maternal Grandfather  Heart attack Maternal Uncle 65   Cancer Neg Hx    Colon cancer Neg Hx    Esophageal cancer Neg Hx    Rectal cancer Neg Hx    Stomach cancer Neg Hx    Pancreatic cancer Neg Hx      Social History   Socioeconomic History   Marital status: Married    Spouse name: Tanya [name]   Number of children: 1   Years of education: 14   Highest education level: Not on file  Occupational History   Occupation: Retired  Tobacco Use   Smoking status: Never   Smokeless tobacco: Never  Vaping  Use   Vaping status: Never Used  Substance and Sexual Activity   Alcohol use: Yes    Alcohol/week: 1.0 standard drink of alcohol    Types: 1 Shots of liquor per week    Comment:  very rarely, 1-2 per month   Drug use: No   Sexual activity: Yes  Other Topics Concern   Not on file  Social History Narrative   Lives with husband.    Right handed   Drinks caffeine prn   Has one daughter   Two story home   Social Drivers of Health   Financial Resource Strain: Low Risk  (05/22/2023)   Overall Financial Resource Strain (CARDIA)    Difficulty of Paying Living Expenses: Not hard at all  Food Insecurity: No Food Insecurity (05/26/2024)   Hunger Vital Sign    Worried About Running Out of Food in the Last Year: Never true    Ran Out of Food in the Last Year: Never true  Transportation Needs: No Transportation Needs (05/26/2024)   PRAPARE - Administrator, Civil Service (Medical): No    Lack of Transportation (Non-Medical): No  Physical Activity: Inactive (05/26/2024)   Exercise Vital Sign    Days of Exercise per Week: 0 days    Minutes of Exercise per Session: 0 min  Stress: No Stress Concern Present (05/26/2024)   [employer] of Occupational Health - Occupational Stress Questionnaire    Feeling of Stress: Not at all  Social Connections: Moderately Integrated (05/26/2024)   Social Connection and Isolation Panel    Frequency of Communication with Friends and Family: Twice a week    Frequency of Social Gatherings with Friends and Family: Once a week    Attends Religious Services: More than 4 times per year    Active Member of [employer] or Organizations: No    Attends Banker Meetings: Never    Marital Status: Married  Catering Manager Violence: Not At Risk (05/26/2024)   Humiliation, Afraid, Rape, and Kick questionnaire    Fear of Current or Ex-Partner: No    Emotionally Abused: No    Physically Abused: No    Sexually Abused: No    Allergies  Allergen  Reactions   Hydromorphone      whelps on skin    Metformin  And Related Other (See Comments)    Lightheaded, neck stiffness   Methocarbamol      [place]helps on skin    Oxycodone  Hives    Blister on back   Prednisone      Reaction=wired per patient    Current Outpatient Medications on File Prior to Visit  Medication Sig Dispense Refill   meclizine  (ANTIVERT ) 25 MG tablet Take 1 tablet (25 mg total) by mouth 3 (three) times daily as needed for dizziness. 30 tablet 5   simvastatin  (ZOCOR ) 20 MG tablet TAKE 1 TABLET BY  MOUTH EVERY DAY IN THE EVENING 90 tablet 3   ciclopirox (PENLAC) 8 % solution Apply topically at bedtime. Apply over nail and surrounding skin. Apply daily over previous coat. After seven (7) days, may remove with alcohol and continue cycle. (Patient not taking: Reported on 06/02/2024) 6.6 mL 2   diazepam  (VALIUM ) 5 MG tablet Take 1 tablet (5 mg total) by mouth every 12 (twelve) hours as needed for anxiety. (Patient not taking: Reported on 06/02/2024) 30 tablet 1   donepezil (ARICEPT) 5 MG tablet Take 1 tablet (5 mg total) by mouth daily. (Patient not taking: Reported on 06/02/2024) 90 tablet 3   famotidine  (PEPCID ) 20 MG tablet Take 1 tablet (20 mg total) by mouth 2 (two) times daily. (Patient not taking: Reported on 06/02/2024) 60 tablet 1   Multiple Vitamin (MULTIVITAMIN) tablet Take 1 tablet by mouth daily. (Patient not taking: Reported on 06/02/2024)     pantoprazole  (PROTONIX ) 40 MG tablet Take 1 tablet (40 mg total) by mouth daily. Take 30 minutes prior to a meal (Patient not taking: Reported on 06/02/2024) 90 tablet 3   traZODone  (DESYREL ) 50 MG tablet TAKE 1 TABLET BY MOUTH AT BEDTIME AS NEEDED FOR SLEEP. (Patient not taking: Reported on 06/02/2024) 90 tablet 2   No current facility-administered medications on file prior to visit.       Objective:    Vitals:   06/02/24 1341  BP: 128/74  Pulse: 90  Temp: 97.9 F (36.6 C)  SpO2: 97%  Weight: 284 lb 12.8 oz (129.2 kg)   Height: 5' 3 (1.6 m)   Physical Exam Gen: Pleasant, well-nourished, in no distress,  normal affect  ENT: No lesions,  mouth clear,  oropharynx clear, no postnasal drip  Neck: No JVD, no stridor  Lungs: No use of accessory muscles, no crackles or wheezing on normal respiration, no wheeze on forced expiration  Cardiovascular: RRR, heart sounds normal, no murmur or gallops, no peripheral edema  Musculoskeletal: No deformities, no cyanosis or clubbing  Neuro: alert, awake, non focal  Skin: Warm, no lesions or rashes       Assessment & Plan:   Assessment & Plan Pulmonary nodules   Assessment and Plan Assessment & Plan Bilateral pulmonary nodules under surveillance Multiple stable pulmonary nodules up to 3 mm, likely benign. Low malignancy risk due to non-smoking status and no family history of lung cancer. Differential includes benign scar tissue or prior infections, active infectious colonization, noninfectious inflammatory processes.  We reviewed this together.. - Repeat CT chest in 6 months to monitor nodules. - Report any respiratory symptoms during the interval. - Discontinued oncology follow-up for now   Return in about 3 months (around 09/09/2024) for With Dr. Shelah, To review CT scan of the chest.   Lamar Shelah, MD, PhD 06/02/2024, 2:17 PM [facility] [phone] or if no answer before 7:00PM call [phone] For any issues after 7:00PM please call eLink [phone]

## 2024-06-02 NOTE — Patient Instructions (Signed)
  VISIT SUMMARY: Today, we discussed your pulmonary nodules and abdominal lymphadenopathy. You were referred for further evaluation due to findings on recent imaging studies. We reviewed your history of breast cancer and recent respiratory illness.  YOUR PLAN: -BILATERAL PULMONARY NODULES: You have multiple small nodules in your lungs, which are likely benign given your non-smoking status and lack of family history of lung cancer. These nodules could be scar tissue or from past infections. We will repeat a CT scan of your chest in 6 months to monitor these nodules. Please report any new respiratory symptoms during this time.  INSTRUCTIONS: Please schedule a follow-up CT chest scan in 6 months. If you experience any new respiratory symptoms, contact our office immediately.

## 2024-06-08 ENCOUNTER — Ambulatory Visit: Admitting: Physical Therapy

## 2024-06-15 ENCOUNTER — Ambulatory Visit: Admitting: Physical Therapy

## 2024-06-22 ENCOUNTER — Ambulatory Visit: Admitting: Physical Therapy

## 2024-07-27 ENCOUNTER — Other Ambulatory Visit: Payer: Self-pay | Admitting: Internal Medicine

## 2024-07-27 DIAGNOSIS — E7849 Other hyperlipidemia: Secondary | ICD-10-CM

## 2024-08-25 ENCOUNTER — Other Ambulatory Visit

## 2024-09-28 ENCOUNTER — Encounter: Admitting: Internal Medicine

## 2024-10-29 ENCOUNTER — Ambulatory Visit: Admitting: Physician Assistant

## 2025-05-27 ENCOUNTER — Ambulatory Visit
